# Patient Record
Sex: Male | Born: 1960 | Race: White | Hispanic: No | Marital: Married | Smoking: Former smoker
Health system: Southern US, Community
[De-identification: ages and names within clinical notes are randomized; demographics above are authoritative.]

## PROBLEM LIST (undated history)

## (undated) DIAGNOSIS — G4733 Obstructive sleep apnea (adult) (pediatric): Secondary | ICD-10-CM

## (undated) DIAGNOSIS — R7303 Prediabetes: Secondary | ICD-10-CM

## (undated) DIAGNOSIS — M2550 Pain in unspecified joint: Secondary | ICD-10-CM

## (undated) DIAGNOSIS — R079 Chest pain, unspecified: Secondary | ICD-10-CM

## (undated) DIAGNOSIS — G4730 Sleep apnea, unspecified: Secondary | ICD-10-CM

## (undated) DIAGNOSIS — F1010 Alcohol abuse, uncomplicated: Secondary | ICD-10-CM

## (undated) DIAGNOSIS — Z96642 Presence of left artificial hip joint: Secondary | ICD-10-CM

## (undated) DIAGNOSIS — R0602 Shortness of breath: Secondary | ICD-10-CM

## (undated) DIAGNOSIS — I2510 Atherosclerotic heart disease of native coronary artery without angina pectoris: Secondary | ICD-10-CM

## (undated) DIAGNOSIS — Z9989 Dependence on other enabling machines and devices: Secondary | ICD-10-CM

## (undated) DIAGNOSIS — N209 Urinary calculus, unspecified: Secondary | ICD-10-CM

## (undated) DIAGNOSIS — E785 Hyperlipidemia, unspecified: Secondary | ICD-10-CM

## (undated) DIAGNOSIS — E7800 Pure hypercholesterolemia, unspecified: Secondary | ICD-10-CM

## (undated) DIAGNOSIS — F1990 Other psychoactive substance use, unspecified, uncomplicated: Secondary | ICD-10-CM

## (undated) DIAGNOSIS — I2109 ST elevation (STEMI) myocardial infarction involving other coronary artery of anterior wall: Secondary | ICD-10-CM

## (undated) DIAGNOSIS — Z87442 Personal history of urinary calculi: Secondary | ICD-10-CM

## (undated) HISTORY — DX: Dependence on other enabling machines and devices: Z99.89

## (undated) HISTORY — DX: Obstructive sleep apnea (adult) (pediatric): G47.33

## (undated) HISTORY — DX: Other psychoactive substance use, unspecified, uncomplicated: F19.90

## (undated) HISTORY — DX: Pure hypercholesterolemia, unspecified: E78.00

## (undated) HISTORY — DX: Shortness of breath: R06.02

## (undated) HISTORY — DX: Sleep apnea, unspecified: G47.30

## (undated) HISTORY — DX: Pain in unspecified joint: M25.50

## (undated) HISTORY — DX: Prediabetes: R73.03

## (undated) HISTORY — DX: Chest pain, unspecified: R07.9

## (undated) HISTORY — DX: Atherosclerotic heart disease of native coronary artery without angina pectoris: I25.10

## (undated) HISTORY — DX: Presence of left artificial hip joint: Z96.642

## (undated) HISTORY — DX: Personal history of urinary calculi: Z87.442

## (undated) HISTORY — DX: Hyperlipidemia, unspecified: E78.5

## (undated) HISTORY — DX: Alcohol abuse, uncomplicated: F10.10

---

## 2011-11-03 ENCOUNTER — Other Ambulatory Visit: Payer: Self-pay | Admitting: Family Medicine

## 2011-11-03 ENCOUNTER — Other Ambulatory Visit (HOSPITAL_COMMUNITY): Payer: Self-pay | Admitting: Family Medicine

## 2011-11-03 DIAGNOSIS — R609 Edema, unspecified: Secondary | ICD-10-CM

## 2011-11-03 DIAGNOSIS — N5089 Other specified disorders of the male genital organs: Secondary | ICD-10-CM

## 2011-11-05 ENCOUNTER — Ambulatory Visit (HOSPITAL_COMMUNITY)
Admission: RE | Admit: 2011-11-05 | Discharge: 2011-11-05 | Disposition: A | Discharge status: Home / Self Care | Payer: BC Managed Care – PPO | Source: Ambulatory Visit | Attending: Family Medicine | Admitting: Family Medicine

## 2011-11-05 DIAGNOSIS — N5089 Other specified disorders of the male genital organs: Secondary | ICD-10-CM | POA: Insufficient documentation

## 2011-12-21 ENCOUNTER — Encounter (INDEPENDENT_AMBULATORY_CARE_PROVIDER_SITE_OTHER): Payer: Self-pay | Admitting: General Surgery

## 2011-12-29 ENCOUNTER — Ambulatory Visit (INDEPENDENT_AMBULATORY_CARE_PROVIDER_SITE_OTHER): Payer: Self-pay | Admitting: General Surgery

## 2012-02-01 ENCOUNTER — Ambulatory Visit (INDEPENDENT_AMBULATORY_CARE_PROVIDER_SITE_OTHER): Payer: BC Managed Care – PPO | Admitting: General Surgery

## 2012-02-01 ENCOUNTER — Encounter (INDEPENDENT_AMBULATORY_CARE_PROVIDER_SITE_OTHER): Payer: Self-pay | Admitting: General Surgery

## 2012-02-01 ENCOUNTER — Telehealth (INDEPENDENT_AMBULATORY_CARE_PROVIDER_SITE_OTHER): Payer: Self-pay | Admitting: General Surgery

## 2012-02-01 VITALS — BP 136/84 | HR 84 | Temp 97.7°F | Resp 18 | Ht 72.0 in | Wt 264.0 lb

## 2012-02-01 DIAGNOSIS — K4090 Unilateral inguinal hernia, without obstruction or gangrene, not specified as recurrent: Secondary | ICD-10-CM

## 2012-02-01 NOTE — Progress Notes (Signed)
Patient ID: Wayne Jordan, male   DOB: 07-Feb-1960, 52 y.o.   MRN: [id number]  Chief Complaint  Patient presents with  . New Evaluation    Rt Lower hernia    HPI Wayne Jordan is a 52 y.o. male.  The patient is a 52 year old male was referred by Dr. Mena Goes office for a right inguinal hernia. Patient states his hernia and scrotal mass in his right scrotal area for approximately 6 years.  The patient states he has issues with urination with bladder full he feels a right induration or scrotum. Patient has had no problems with incarceration or obstruction. HPI  Past Medical History  Diagnosis Date  . Personal history of urinary calculi     Past Surgical History  Procedure Date  . Shoulder surgery     right    Family History  Problem Relation Age of Onset  . Heart disease Father     Social History History  Substance Use Topics  . Smoking status: Former Games developer  . Smokeless tobacco: Not on file  . Alcohol Use: Yes    Allergies  Allergen Reactions  . Codeine     Derivalives    No current outpatient prescriptions on file.    Review of Systems Review of Systems  Blood pressure 136/84, pulse 84, temperature 97.7 F (36.5 C), temperature source Temporal, resp. rate 18, height 6' (1.829 m), weight 264 lb (119.75 kg).  Physical Exam Physical Exam  Data Reviewed none  Assessment    52 year old male with a large right inguinal hernia and likely involving the bladder.    Plan    1. We'll obtain a abdominal CT scan to evaluate his bladder and hernia the right side.  2. After this pain and reviewed we'll schedule patient for an open versus laparoscopic hernia repair with Mesh.  At this point leading towards an open approach. I discussed the risks and benefits with the patient. The patient is scheduled after the CT has been reviewed  3.All risks and benefits were discussed with the patient, to generally include infection, bleeding, damage to surrounding structures, and  recurrence. Alternatives were offered and described.  All questions were answered and the patient voiced understanding of the procedure and wishes to proceed at this point.          Wayne Jordan., Wayne Jordan 02/01/2012, 4:15 PM

## 2012-02-01 NOTE — Telephone Encounter (Signed)
LMOM to see if patient could come in sooner for today's appt..1:45 for a 2:00 appt...ask the patient to return my call

## 2012-02-02 ENCOUNTER — Telehealth (INDEPENDENT_AMBULATORY_CARE_PROVIDER_SITE_OTHER): Payer: Self-pay | Admitting: General Surgery

## 2012-02-02 ENCOUNTER — Other Ambulatory Visit (INDEPENDENT_AMBULATORY_CARE_PROVIDER_SITE_OTHER): Payer: Self-pay | Admitting: General Surgery

## 2012-02-02 DIAGNOSIS — K469 Unspecified abdominal hernia without obstruction or gangrene: Secondary | ICD-10-CM

## 2012-02-02 NOTE — Telephone Encounter (Signed)
Called to give patient his appt date and time for his CT of abd and pelvis with contrast 02/08/12 at 3:45 [address]..patient was ok with all instructions given

## 2012-02-08 ENCOUNTER — Ambulatory Visit
Admission: RE | Admit: 2012-02-08 | Discharge: 2012-02-08 | Disposition: A | Discharge status: Home / Self Care | Payer: BC Managed Care – PPO | Source: Ambulatory Visit | Attending: General Surgery | Admitting: General Surgery

## 2012-02-08 DIAGNOSIS — K469 Unspecified abdominal hernia without obstruction or gangrene: Secondary | ICD-10-CM

## 2012-02-08 MED ORDER — IOHEXOL 300 MG/ML  SOLN
125.0000 mL | Freq: Once | INTRAMUSCULAR | Status: AC | PRN
  Administered 2012-02-08: 125 mL via INTRAVENOUS

## 2012-02-11 ENCOUNTER — Encounter (INDEPENDENT_AMBULATORY_CARE_PROVIDER_SITE_OTHER): Payer: Self-pay | Admitting: General Surgery

## 2012-02-11 ENCOUNTER — Ambulatory Visit (INDEPENDENT_AMBULATORY_CARE_PROVIDER_SITE_OTHER): Payer: BC Managed Care – PPO | Admitting: General Surgery

## 2012-02-11 VITALS — BP 150/82 | HR 107 | Temp 97.0°F | Resp 20 | Ht 72.0 in | Wt 265.0 lb

## 2012-02-11 DIAGNOSIS — K4090 Unilateral inguinal hernia, without obstruction or gangrene, not specified as recurrent: Secondary | ICD-10-CM

## 2012-02-11 NOTE — Progress Notes (Signed)
Subjective:     Patient ID: Wayne Jordan, male   DOB: 1960-11-17, 52 y.o.   MRN: [id number]  HPI Patient's 52 year old male who I recently saw for a right inguinal hernia. Patient on the CT scan and it contents of the hernia secondary to symptomatology which seemed liked involvement of the bladder. CT scan reveals a sliding hernia with a ladder in the hernia sac extending down towards the scrotum. This has been changed since his initial visit.  Review of Systems  Constitutional: Negative.   HENT: Negative.   Respiratory: Negative.   Cardiovascular: Negative.   Gastrointestinal: Negative.   Musculoskeletal: Negative.   Neurological: Negative.        Objective:   Physical Exam  Constitutional: He is oriented to person, place, and time. He appears well-developed and well-nourished.  HENT:  Head: Normocephalic and atraumatic.  Eyes: Conjunctivae normal and EOM are normal.  Neck: Normal range of motion. Neck supple.  Cardiovascular: Normal rate and normal heart sounds.   Pulmonary/Chest: Effort normal and breath sounds normal.  Abdominal: Soft. Bowel sounds are normal. He exhibits no mass. There is tenderness. There is no rebound and no guarding. A hernia is present. Hernia confirmed positive in the right inguinal area.  Genitourinary: Penis normal.  Musculoskeletal: Normal range of motion.  Neurological: He is alert and oriented to person, place, and time.       Assessment:     52 year old male with a sliding right inguinal hernia involving the bladder.    Plan:     1. We'll proceed to the operating room for an open right inguinal hernia repair with mesh.  2.  All risks and benefits were discussed with the patient, to generally include infection, bleeding, damage to surrounding structures, and recurrence. Alternatives were offered and described.  All questions were answered and the patient voiced understanding of the procedure and wishes to proceed at this point.

## 2012-02-24 ENCOUNTER — Encounter (HOSPITAL_COMMUNITY): Payer: Self-pay | Admitting: Pharmacy Technician

## 2012-03-03 ENCOUNTER — Encounter (HOSPITAL_COMMUNITY): Payer: Self-pay

## 2012-03-03 ENCOUNTER — Encounter (HOSPITAL_COMMUNITY)
Admission: RE | Admit: 2012-03-03 | Discharge: 2012-03-03 | Disposition: A | Discharge status: Home / Self Care | Payer: BC Managed Care – PPO | Source: Ambulatory Visit | Attending: General Surgery | Admitting: General Surgery

## 2012-03-03 HISTORY — DX: Urinary calculus, unspecified: N20.9

## 2012-03-03 LAB — CBC
HCT: 46.6 % (ref 39.0–52.0)
MCHC: 34.8 g/dL (ref 30.0–36.0)
MCV: 90 fL (ref 78.0–100.0)
RDW: 12.9 % (ref 11.5–15.5)

## 2012-03-03 NOTE — Pre-Procedure Instructions (Addendum)
Wayne Jordan  03/03/2012   Your procedure is scheduled on: 03/08/12  Report to [facility] at 1100 AM.  Call this number if you have problems the morning of surgery: [phone]   Remember:   Do not eat food or drink liquids after midnight.   Take these medicines the morning of surgery with A SIP OF WATER: none   Do not wear jewelry, make-up or nail polish.  Do not wear lotions, powders, or perfumes. You may wear deodorant.  Do not shave 48 hours prior to surgery. Men may shave face and neck.  Do not bring valuables to the hospital.  Contacts, dentures or bridgework may not be worn into surgery.  Leave suitcase in the car. After surgery it may be brought to your room.  For patients admitted to the hospital, checkout time is 11:00 AM the day of  discharge.   Patients discharged the day of surgery will not be allowed to drive  home.  Name and phone number of your driver:   Special Instructions: Shower using CHG 2 nights before surgery and the night before surgery.  If you shower the day of surgery use CHG.  Use special wash - you have one bottle of CHG for all showers.  You should use approximately 1/3 of the bottle for each shower.   Please read over the following fact sheets that you were given: Pain Booklet, Coughing and Deep Breathing, MRSA Information and Surgical Site Infection Prevention

## 2012-03-07 MED ORDER — VANCOMYCIN HCL 10 G IV SOLR
1500.0000 mg | Freq: Once | INTRAVENOUS | Status: AC
  Administered 2012-03-08: 1500 mg via INTRAVENOUS
  Filled 2012-03-07: qty 1500

## 2012-03-07 MED ORDER — CEFAZOLIN SODIUM-DEXTROSE 2-3 GM-% IV SOLR: 2.0000 g | INTRAVENOUS | Status: DC

## 2012-03-07 NOTE — Progress Notes (Signed)
LEFT MESSAGE FOR PATIENT TO ARRIVE AT 1030 AM 03/08/12.  REQUESTED PATIENT RETURN CALL .

## 2012-03-08 ENCOUNTER — Encounter (HOSPITAL_COMMUNITY)
Admission: RE | Disposition: A | Discharge status: Home / Self Care | Payer: Self-pay | Source: Ambulatory Visit | Attending: General Surgery

## 2012-03-08 ENCOUNTER — Ambulatory Visit (HOSPITAL_COMMUNITY)
Admission: RE | Admit: 2012-03-08 | Discharge: 2012-03-08 | Disposition: A | Discharge status: Home / Self Care | Payer: BC Managed Care – PPO | Source: Ambulatory Visit | Attending: General Surgery | Admitting: General Surgery

## 2012-03-08 ENCOUNTER — Encounter (HOSPITAL_COMMUNITY): Payer: Self-pay | Admitting: Anesthesiology

## 2012-03-08 ENCOUNTER — Encounter (HOSPITAL_COMMUNITY): Payer: Self-pay | Admitting: *Deleted

## 2012-03-08 ENCOUNTER — Ambulatory Visit (HOSPITAL_COMMUNITY): Payer: BC Managed Care – PPO | Admitting: Anesthesiology

## 2012-03-08 DIAGNOSIS — K4090 Unilateral inguinal hernia, without obstruction or gangrene, not specified as recurrent: Secondary | ICD-10-CM

## 2012-03-08 DIAGNOSIS — Z01812 Encounter for preprocedural laboratory examination: Secondary | ICD-10-CM | POA: Insufficient documentation

## 2012-03-08 HISTORY — PX: INSERTION OF MESH: SHX5868

## 2012-03-08 HISTORY — PX: INGUINAL HERNIA REPAIR: SHX194

## 2012-03-08 SURGERY — REPAIR, HERNIA, INGUINAL, ADULT
Anesthesia: General | Site: Groin | Laterality: Right | Wound class: Clean Contaminated

## 2012-03-08 MED ORDER — BUPIVACAINE-EPINEPHRINE 0.25% -1:200000 IJ SOLN
INTRAMUSCULAR | Status: AC
  Filled 2012-03-08: qty 1

## 2012-03-08 MED ORDER — PROPOFOL 10 MG/ML IV BOLUS
INTRAVENOUS | Status: DC | PRN
  Administered 2012-03-08: 200 mg via INTRAVENOUS

## 2012-03-08 MED ORDER — NEOSTIGMINE METHYLSULFATE 1 MG/ML IJ SOLN
INTRAMUSCULAR | Status: DC | PRN
  Administered 2012-03-08: 3 mg via INTRAVENOUS

## 2012-03-08 MED ORDER — LIDOCAINE HCL (CARDIAC) 20 MG/ML IV SOLN
INTRAVENOUS | Status: DC | PRN
  Administered 2012-03-08: 100 mg via INTRAVENOUS

## 2012-03-08 MED ORDER — LACTATED RINGERS IV SOLN
INTRAVENOUS | Status: DC | PRN
  Administered 2012-03-08 (×2): via INTRAVENOUS

## 2012-03-08 MED ORDER — HYDROMORPHONE HCL PF 1 MG/ML IJ SOLN: 0.2500 mg | INTRAMUSCULAR | Status: DC | PRN

## 2012-03-08 MED ORDER — BUPIVACAINE-EPINEPHRINE 0.25% -1:200000 IJ SOLN
INTRAMUSCULAR | Status: DC | PRN
  Administered 2012-03-08: 8 mL

## 2012-03-08 MED ORDER — OXYCODONE-ACETAMINOPHEN 10-325 MG PO TABS: 1.0000 | ORAL_TABLET | ORAL | Status: DC | PRN

## 2012-03-08 MED ORDER — GLYCOPYRROLATE 0.2 MG/ML IJ SOLN
INTRAMUSCULAR | Status: DC | PRN
  Administered 2012-03-08: 0.4 mg via INTRAVENOUS

## 2012-03-08 MED ORDER — ONDANSETRON HCL 4 MG/2ML IJ SOLN
INTRAMUSCULAR | Status: DC | PRN
  Administered 2012-03-08: 4 mg via INTRAVENOUS

## 2012-03-08 MED ORDER — ROCURONIUM BROMIDE 100 MG/10ML IV SOLN
INTRAVENOUS | Status: DC | PRN
  Administered 2012-03-08: 10 mg via INTRAVENOUS
  Administered 2012-03-08: 50 mg via INTRAVENOUS
  Administered 2012-03-08: 10 mg via INTRAVENOUS

## 2012-03-08 MED ORDER — DEXAMETHASONE SODIUM PHOSPHATE 10 MG/ML IJ SOLN
INTRAMUSCULAR | Status: DC | PRN
  Administered 2012-03-08: 10 mg via INTRAVENOUS

## 2012-03-08 MED ORDER — PHENYLEPHRINE HCL 10 MG/ML IJ SOLN: INTRAMUSCULAR | Status: DC | PRN

## 2012-03-08 MED ORDER — OXYCODONE HCL 5 MG/5ML PO SOLN: 5.0000 mg | Freq: Once | ORAL | Status: DC | PRN

## 2012-03-08 MED ORDER — CHLORHEXIDINE GLUCONATE 4 % EX LIQD: 1.0000 "application " | Freq: Once | CUTANEOUS | Status: DC

## 2012-03-08 MED ORDER — OXYCODONE HCL 5 MG PO TABS
5.0000 mg | ORAL_TABLET | Freq: Once | ORAL | Status: DC | PRN
Start: 2012-03-08 — End: 2012-03-08

## 2012-03-08 MED ORDER — FENTANYL CITRATE 0.05 MG/ML IJ SOLN
INTRAMUSCULAR | Status: DC | PRN
  Administered 2012-03-08: 50 ug via INTRAVENOUS
  Administered 2012-03-08: 150 ug via INTRAVENOUS
  Administered 2012-03-08: 50 ug via INTRAVENOUS
  Administered 2012-03-08: 100 ug via INTRAVENOUS
  Administered 2012-03-08 (×2): 50 ug via INTRAVENOUS

## 2012-03-08 MED ORDER — METOCLOPRAMIDE HCL 5 MG/ML IJ SOLN
INTRAMUSCULAR | Status: DC | PRN
  Administered 2012-03-08: 10 mg via INTRAVENOUS

## 2012-03-08 MED ORDER — MIDAZOLAM HCL 5 MG/5ML IJ SOLN
INTRAMUSCULAR | Status: DC | PRN
  Administered 2012-03-08: 2 mg via INTRAVENOUS

## 2012-03-08 MED ORDER — METOCLOPRAMIDE HCL 5 MG/ML IJ SOLN: 10.0000 mg | Freq: Once | INTRAMUSCULAR | Status: DC | PRN

## 2012-03-08 SURGICAL SUPPLY — 55 items
ADH SKN CLS APL DERMABOND .7 (GAUZE/BANDAGES/DRESSINGS) ×2
APL SKNCLS STERI-STRIP NONHPOA (GAUZE/BANDAGES/DRESSINGS) ×2
BENZOIN TINCTURE PRP APPL 2/3 (GAUZE/BANDAGES/DRESSINGS) ×3 IMPLANT
BLADE SURG 15 STRL LF DISP TIS (BLADE) ×2 IMPLANT
BLADE SURG 15 STRL SS (BLADE) ×3
BLADE SURG ROTATE 9660 (MISCELLANEOUS) ×1 IMPLANT
CHLORAPREP W/TINT 26ML (MISCELLANEOUS) ×3 IMPLANT
CLOTH BEACON ORANGE TIMEOUT ST (SAFETY) ×3 IMPLANT
COVER SURGICAL LIGHT HANDLE (MISCELLANEOUS) ×3 IMPLANT
DECANTER SPIKE VIAL GLASS SM (MISCELLANEOUS) ×3 IMPLANT
DERMABOND ADVANCED (GAUZE/BANDAGES/DRESSINGS) ×1
DERMABOND ADVANCED .7 DNX12 (GAUZE/BANDAGES/DRESSINGS) IMPLANT
DRAIN PENROSE 1/2X12 LTX STRL (WOUND CARE) ×1 IMPLANT
DRAPE LAPAROSCOPIC ABDOMINAL (DRAPES) IMPLANT
DRAPE LAPAROTOMY TRNSV 102X78 (DRAPE) ×1 IMPLANT
DRAPE UTILITY 15X26 W/TAPE STR (DRAPE) ×12 IMPLANT
DRSG TEGADERM 4X4.75 (GAUZE/BANDAGES/DRESSINGS) IMPLANT
ELECT CAUTERY BLADE 6.4 (BLADE) ×3 IMPLANT
ELECT REM PT RETURN 9FT ADLT (ELECTROSURGICAL) ×3
ELECTRODE REM PT RTRN 9FT ADLT (ELECTROSURGICAL) ×2 IMPLANT
GAUZE SPONGE 4X4 16PLY XRAY LF (GAUZE/BANDAGES/DRESSINGS) ×3 IMPLANT
GLOVE BIO SURGEON STRL SZ7.5 (GLOVE) ×3 IMPLANT
GLOVE BIOGEL PI IND STRL 8 (GLOVE) ×2 IMPLANT
GLOVE BIOGEL PI INDICATOR 8 (GLOVE) ×1
GOWN STRL NON-REIN LRG LVL3 (GOWN DISPOSABLE) ×6 IMPLANT
GOWN STRL REIN XL XLG (GOWN DISPOSABLE) ×3 IMPLANT
KIT BASIN OR (CUSTOM PROCEDURE TRAY) ×3 IMPLANT
KIT ROOM TURNOVER OR (KITS) ×3 IMPLANT
MESH ULTRAPRO 3X6 7.6X15CM (Mesh General) ×1 IMPLANT
NDL HYPO 25GX1X1/2 BEV (NEEDLE) ×2 IMPLANT
NEEDLE HYPO 25GX1X1/2 BEV (NEEDLE) ×3 IMPLANT
NS IRRIG 1000ML POUR BTL (IV SOLUTION) ×3 IMPLANT
PACK SURGICAL SETUP 50X90 (CUSTOM PROCEDURE TRAY) ×3 IMPLANT
PAD ARMBOARD 7.5X6 YLW CONV (MISCELLANEOUS) ×3 IMPLANT
PENCIL BUTTON HOLSTER BLD 10FT (ELECTRODE) ×3 IMPLANT
SPECIMEN JAR SMALL (MISCELLANEOUS) IMPLANT
SPONGE GAUZE 4X4 12PLY (GAUZE/BANDAGES/DRESSINGS) IMPLANT
SPONGE INTESTINAL PEANUT (DISPOSABLE) ×1 IMPLANT
STRIP CLOSURE SKIN 1/2X4 (GAUZE/BANDAGES/DRESSINGS) IMPLANT
SUT MNCRL AB 4-0 PS2 18 (SUTURE) ×3 IMPLANT
SUT PDS AB 0 CT 36 (SUTURE) IMPLANT
SUT PROLENE 2 0 SH DA (SUTURE) ×3 IMPLANT
SUT SILK 2 0 SH (SUTURE) IMPLANT
SUT SILK 3 0 (SUTURE) ×3
SUT SILK 3-0 18XBRD TIE 12 (SUTURE) ×2 IMPLANT
SUT VIC AB 0 CT2 27 (SUTURE) IMPLANT
SUT VIC AB 2-0 SH 27 (SUTURE) ×6
SUT VIC AB 2-0 SH 27X BRD (SUTURE) ×2 IMPLANT
SUT VIC AB 3-0 SH 27 (SUTURE) ×3
SUT VIC AB 3-0 SH 27XBRD (SUTURE) ×2 IMPLANT
SYR CONTROL 10ML LL (SYRINGE) ×3 IMPLANT
TOWEL OR 17X24 6PK STRL BLUE (TOWEL DISPOSABLE) ×3 IMPLANT
TOWEL OR 17X26 10 PK STRL BLUE (TOWEL DISPOSABLE) ×3 IMPLANT
TUBE CONNECTING 12X1/4 (SUCTIONS) ×1 IMPLANT
YANKAUER SUCT BULB TIP NO VENT (SUCTIONS) ×2 IMPLANT

## 2012-03-08 NOTE — H&P (View-Only) (Signed)
Subjective:     Patient ID: Wayne Jordan, male   DOB: 08/26/1960, 51 y.o.   MRN: [id number]  HPI Patient's 51-year-old male who I recently saw for a right inguinal hernia. Patient on the CT scan and it contents of the hernia secondary to symptomatology which seemed liked involvement of the bladder. CT scan reveals a sliding hernia with a ladder in the hernia sac extending down towards the scrotum. This has been changed since his initial visit.  Review of Systems  Constitutional: Negative.   HENT: Negative.   Respiratory: Negative.   Cardiovascular: Negative.   Gastrointestinal: Negative.   Musculoskeletal: Negative.   Neurological: Negative.        Objective:   Physical Exam  Constitutional: He is oriented to person, place, and time. He appears well-developed and well-nourished.  HENT:  Head: Normocephalic and atraumatic.  Eyes: Conjunctivae normal and EOM are normal.  Neck: Normal range of motion. Neck supple.  Cardiovascular: Normal rate and normal heart sounds.   Pulmonary/Chest: Effort normal and breath sounds normal.  Abdominal: Soft. Bowel sounds are normal. He exhibits no mass. There is tenderness. There is no rebound and no guarding. A hernia is present. Hernia confirmed positive in the right inguinal area.  Genitourinary: Penis normal.  Musculoskeletal: Normal range of motion.  Neurological: He is alert and oriented to person, place, and time.       Assessment:     51-year-old male with a sliding right inguinal hernia involving the bladder.    Plan:     1. We'll proceed to the operating room for an open right inguinal hernia repair with mesh.  2.  All risks and benefits were discussed with the patient, to generally include infection, bleeding, damage to surrounding structures, and recurrence. Alternatives were offered and described.  All questions were answered and the patient voiced understanding of the procedure and wishes to proceed at this point.       

## 2012-03-08 NOTE — Op Note (Addendum)
Pre Operative Diagnosis:  RIH  Post Operative Diagnosis: R pantaloon hernia  Procedure: R IHR with mesh  Surgeon: Dr. Axel Filler  Assistant: none  Anesthesia: GETA  EBL: 10cc  Complications: none  Counts: reported as correct x 2  Findings:  The patient had a direct and indirect hernia. The indirect hernia sac was opened and seen to be containing part of the bladder. High ligation of the indirect hernia sac was undertaken in the hernia sac retracted into the abdomen.  Indications for procedure:  The patient is a 52 year old male with a multiple week history of right bulging and pain. Patient also had some issues with  empting of the bladder.  The patient underwent CT of abdomen and pelvis which revealed a sliding anal hernia which contained a portion of the bladder.  Details of the procedure:The patient was taken back to the operating room. The patient was placed in supine position with bilateral SCDs in place. After appropriate anitbiotics were confirmed, a time-out was confirmed and all facts were verified.  A 4 cm incision was made approximately 1 cm above the ilioinguinal ligament. Bovie cautery was used maintain hemostasis an dissection was carried down through Scarpa's fascia. Dissection was carried down to the external oblique muscle. A stab incision was made laterally and blunt dissection using Metzenbaum scissors was used to free up any underlying tissue. The incision in the external oblique was then carried through the external ring. This freed up the spermatic cord. The ilioinguinal nerve was identified sacrificed and suture ligated with a 3-0 Vicryl.The external oblique was then cleaned off and inferiorly as well as superiorly. The spermatic cord was encircled at the pubic tubercle which was extremely difficult secondary to fat and hernia content. A Penrose drain was placed around the cord. The spermatic vessels and vas deferens  Were identified early and protected all portions  of the case.  a large direct hernia was identified and this was reduced back into the abdomen is easily as possible. At this time the cremasteric muscles were dissected away from the hernia sac the hernia sac was dissected back towards the epigastric vessels. The indirect hernia sac was then opened a part of the wall was seen to be in the bladder. The dissected ligated as high as possible without interfering with the bladder using a 2-0 Vicryl stitch. This spontaneously reduced into the abdomen. At this point the rectum was cleared off and a piece of 3 x 6 cm Prolene mesh was cut to shape and anchored to the pubic to home using 2-0 Prolene. The inferior edge of the mesh was then secured in a running fashion to the shelving edge of the external oblique. The superior edge was anchored to the conjoint tendon in a running fashion. The tails were brought together and a slit was made in the tail of the mesh to recreate the internal inguinal canal. The tail of the duct under the external oblique. The external oblique was reapproximated using 2-0 Vicryl in a running fashion. Scarpa's fascia was reapproximated using 3-0 Vicryl in a running fashion. The skin was reapproximated using 4 Monocryl subcuticular fashion. The skin was addressed with Dermabond. The patient's right testicle was confirmed to be within the scrotum at the end of the case.  The patient was taken to the recovery room in stable condition.

## 2012-03-08 NOTE — OR Nursing (Signed)
I was given an order by Dr. Derrell Lolling to irrigate the bladder with of normal saline before removing the catheter. When I irrigated I was only able to insert  before I met too much resistance to move forward. I alerted the Doctor and he said ok pull the foley, so I removed the foley as directed.

## 2012-03-08 NOTE — Transfer of Care (Signed)
Immediate Anesthesia Transfer of Care Note  Patient: Wayne Jordan  Procedure(s) Performed: Procedure(s): HERNIA REPAIR INGUINAL ADULT (Right) INSERTION OF MESH (N/A)  Patient Location: PACU  Anesthesia Type:General  Level of Consciousness: awake, alert , oriented and patient cooperative  Airway & Oxygen Therapy: Patient Spontanous Breathing and Patient connected to nasal cannula oxygen  Post-op Assessment: Report given to PACU RN, Post -op Vital signs reviewed and stable and Patient moving all extremities  Post vital signs: Reviewed and stable  Complications: No apparent anesthesia complications

## 2012-03-08 NOTE — Progress Notes (Signed)
Report given to sharon rn as caregiver 

## 2012-03-08 NOTE — Anesthesia Preprocedure Evaluation (Addendum)
Anesthesia Evaluation  Patient identified by MRN, date of birth, ID band Patient awake    Reviewed: Allergy & Precautions, H&P , NPO status , Patient's Chart, lab work & pertinent test results, reviewed documented beta blocker date and time   Airway Mallampati: III TM Distance: >3 FB Neck ROM: full    Dental  (+) Dental Advisory Given   Pulmonary neg pulmonary ROS,  breath sounds clear to auscultation        Cardiovascular negative cardio ROS  Rhythm:regular     Neuro/Psych negative neurological ROS  negative psych ROS   GI/Hepatic negative GI ROS, Neg liver ROS,   Endo/Other  negative endocrine ROSMorbid obesity  Renal/GU negative Renal ROS  negative genitourinary   Musculoskeletal   Abdominal   Peds  Hematology negative hematology ROS (+)   Anesthesia Other Findings See surgeon's H&P   Reproductive/Obstetrics negative OB ROS                          Anesthesia Physical Anesthesia Plan  ASA: II  Anesthesia Plan: General   Post-op Pain Management:    Induction: Intravenous  Airway Management Planned: LMA  Additional Equipment:   Intra-op Plan:   Post-operative Plan: Extubation in OR  Informed Consent: I have reviewed the patients History and Physical, chart, labs and discussed the procedure including the risks, benefits and alternatives for the proposed anesthesia with the patient or authorized representative who has indicated his/her understanding and acceptance.   Dental Advisory Given  Plan Discussed with: CRNA, Surgeon and Anesthesiologist  Anesthesia Plan Comments:        Anesthesia Quick Evaluation

## 2012-03-08 NOTE — Anesthesia Procedure Notes (Addendum)
Procedure Name: Intubation Date/Time: 03/08/2012 1:04 PM Performed by: Rogelia Boga Pre-anesthesia Checklist: Patient identified, Emergency Drugs available, Suction available, Patient being monitored and Timeout performed Patient Re-evaluated:Patient Re-evaluated prior to inductionOxygen Delivery Method: Circle system utilized Laryngoscope Size: Mac and 4 Grade View: Grade II Tube type: Oral Tube size: 7.5 mm Number of attempts: 1 Airway Equipment and Method: Stylet Secured at: 21 cm Tube secured with: Tape Dental Injury: Teeth and Oropharynx as per pre-operative assessment  Comments: Surgeon Needed Muscle Relaxation and Proseal LMA was removed and Pt Intubated as above.    Procedure Name: LMA Insertion Date/Time: 03/08/2012 12:45 PM Performed by: Rogelia Boga Pre-anesthesia Checklist: Patient identified, Emergency Drugs available, Suction available, Patient being monitored and Timeout performed Patient Re-evaluated:Patient Re-evaluated prior to inductionPreoxygenation: Pre-oxygenation with 100% oxygen Intubation Type: IV induction Ventilation: Mask ventilation without difficulty LMA: LMA with gastric port inserted LMA Size: 5.0 Number of attempts: 1 Placement Confirmation: positive ETCO2 and breath sounds checked- equal and bilateral Tube secured with: Tape Dental Injury: Teeth and Oropharynx as per pre-operative assessment

## 2012-03-08 NOTE — Interval H&P Note (Signed)
History and Physical Interval Note:  03/08/2012 10:10 AM  Wayne Jordan  has presented today for surgery, with the diagnosis of right sliding inguinal hernia  The various methods of treatment have been discussed with the patient and family. After consideration of risks, benefits and other options for treatment, the patient has consented to  Procedure(s): HERNIA REPAIR INGUINAL ADULT (Right) INSERTION OF MESH (N/A) as a surgical intervention .  The patient's history has been reviewed, patient examined, no change in status, stable for surgery.  I have reviewed the patient's chart and labs.  Questions were answered to the patient's satisfaction.     Marigene Ehlers., Jed Limerick

## 2012-03-08 NOTE — Anesthesia Postprocedure Evaluation (Signed)
Anesthesia Post Note  Patient: Wayne Jordan  Procedure(s) Performed: Procedure(s) (LRB): HERNIA REPAIR INGUINAL ADULT (Right) INSERTION OF MESH (N/A)  Anesthesia type: General  Patient location: PACU  Post pain: Pain level controlled and Adequate analgesia  Post assessment: Post-op Vital signs reviewed, Patient's Cardiovascular Status Stable, Respiratory Function Stable, Patent Airway and Pain level controlled  Last Vitals:  Filed Vitals:   03/08/12 1633  BP: 137/84  Pulse: 66  Temp: 36.1 C  Resp: 16    Post vital signs: Reviewed and stable  Level of consciousness: awake, alert  and oriented  Complications: No apparent anesthesia complications

## 2012-03-08 NOTE — Preoperative (Signed)
Beta Blockers   Reason not to administer Beta Blockers:Not Applicable 

## 2012-03-09 ENCOUNTER — Telehealth (INDEPENDENT_AMBULATORY_CARE_PROVIDER_SITE_OTHER): Payer: Self-pay | Admitting: General Surgery

## 2012-03-09 NOTE — Telephone Encounter (Signed)
Spoke with patient  F/u OV   appt  03/24/12 @ 420PM

## 2012-03-10 ENCOUNTER — Encounter (HOSPITAL_COMMUNITY): Payer: Self-pay | Admitting: General Surgery

## 2012-03-24 ENCOUNTER — Ambulatory Visit (INDEPENDENT_AMBULATORY_CARE_PROVIDER_SITE_OTHER): Payer: BC Managed Care – PPO | Admitting: General Surgery

## 2012-03-24 DIAGNOSIS — Z9889 Other specified postprocedural states: Secondary | ICD-10-CM

## 2012-03-27 NOTE — Progress Notes (Signed)
Patient ID: Wayne Jordan, male   DOB: 02/28/1960, 52 y.o.   MRN: [id number] Pt is s/p [facility] repair.  HE has been doing well no complaints. BAck at work  On exam Wnd c/d/i No hernia on palp  Another 4 weeks wt restricted lifting F/u PRN

## 2012-06-13 ENCOUNTER — Encounter (HOSPITAL_COMMUNITY): Payer: Self-pay | Admitting: Emergency Medicine

## 2012-06-13 ENCOUNTER — Emergency Department (HOSPITAL_COMMUNITY)
Admission: EM | Admit: 2012-06-13 | Discharge: 2012-06-13 | Disposition: A | Discharge status: Home / Self Care | Payer: BC Managed Care – PPO | Attending: Emergency Medicine | Admitting: Emergency Medicine

## 2012-06-13 ENCOUNTER — Emergency Department (HOSPITAL_COMMUNITY): Payer: BC Managed Care – PPO

## 2012-06-13 DIAGNOSIS — Z87891 Personal history of nicotine dependence: Secondary | ICD-10-CM | POA: Insufficient documentation

## 2012-06-13 DIAGNOSIS — Z880 Allergy status to penicillin: Secondary | ICD-10-CM | POA: Insufficient documentation

## 2012-06-13 DIAGNOSIS — R0789 Other chest pain: Secondary | ICD-10-CM | POA: Insufficient documentation

## 2012-06-13 DIAGNOSIS — R079 Chest pain, unspecified: Secondary | ICD-10-CM

## 2012-06-13 DIAGNOSIS — R61 Generalized hyperhidrosis: Secondary | ICD-10-CM | POA: Insufficient documentation

## 2012-06-13 DIAGNOSIS — Z87442 Personal history of urinary calculi: Secondary | ICD-10-CM | POA: Insufficient documentation

## 2012-06-13 LAB — COMPREHENSIVE METABOLIC PANEL
ALT: 29 U/L (ref 0–53)
AST: 24 U/L (ref 0–37)
CO2: 26 mEq/L (ref 19–32)
Calcium: 9.7 mg/dL (ref 8.4–10.5)
Chloride: 107 mEq/L (ref 96–112)
GFR calc non Af Amer: >90 mL/min (ref >90)
Potassium: 4.2 mEq/L (ref 3.5–5.1)
Sodium: 142 mEq/L (ref 135–145)

## 2012-06-13 LAB — CBC WITH DIFFERENTIAL/PLATELET
Basophils Absolute: 0 10*3/uL (ref 0.0–0.1)
Eosinophils Relative: 2 % (ref 0–5)
Lymphocytes Relative: 27 % (ref 12–46)
Neutro Abs: 4.7 10*3/uL (ref 1.7–7.7)
Neutrophils Relative %: 63 % (ref 43–77)
Platelets: 201 10*3/uL (ref 150–400)
RDW: 13.3 % (ref 11.5–15.5)
WBC: 7.5 10*3/uL (ref 4.0–10.5)

## 2012-06-13 LAB — POCT I-STAT TROPONIN I

## 2012-06-13 MED ORDER — ASPIRIN 81 MG PO CHEW
324.0000 mg | CHEWABLE_TABLET | Freq: Once | ORAL | Status: AC
  Administered 2012-06-13: 324 mg via ORAL
  Filled 2012-06-13: qty 4

## 2012-06-13 NOTE — ED Provider Notes (Signed)
Medical screening examination/treatment/procedure(s) were performed by non-physician practitioner and as supervising physician I was immediately available for consultation/collaboration.   Ladaisha Portillo, MD 06/13/12 2220 

## 2012-06-13 NOTE — ED Provider Notes (Signed)
History     CSN: [id number]  Arrival date & time 06/13/12  1826   First MD Initiated Contact with Patient 06/13/12 1858      Chief Complaint  Patient presents with  . Chest Pain    (Consider location/radiation/quality/duration/timing/severity/associated sxs/prior treatment) HPI  ISAAK DELMUNDO is a 52 y.o. male c/o substernalchest pain, radiating to jaw, described as sharp, rated at 8/10 at worst, 0/10 now. Relieved by nothing. Pain started x7hours ago while pt was working. Pain has been intermittent lasting for seconds at a time, non-exertional, non-pleuritic or positional. Pain is associated with sweating. Denies SOB, N/V,  cough, fever, back pain, syncope, prior episodes, recent cocaine/methamphetimine use. Denies h/o DVT, PE,  recent travel, leg swelling, hemoptysis.  Pt has not received any ASA or NTG in the last 24 hours.  RF:  Former smoker quit in 97 (40 pack year history), family history (father and uncle had MI in their 33s) Cath:?  Last Stress test: ? Cardiologost: None PCP: [facility]   Past Medical History  Diagnosis Date  . Personal history of urinary calculi   . 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     Past Surgical History  Procedure Laterality Date  . Inguinal hernia repair Right 03/08/2012    Procedure: HERNIA REPAIR INGUINAL ADULT;  Surgeon: Axel Filler, MD;  Location: [facility] OR;  Service: General;  Laterality: Right;  . Insertion of mesh N/A 03/08/2012    Procedure: INSERTION OF MESH;  Surgeon: Axel Filler, MD;  Location: [facility] OR;  Service: General;  Laterality: N/A;    Family History  Problem Relation Age of Onset  . Heart disease Father     History  Substance Use Topics  . Smoking status: Former Smoker -- 1.50 packs/day for 7 years    Types: Cigarettes    Quit date: 03/04/1995  . Smokeless tobacco: Not on file  . Alcohol Use: Yes      Review of Systems  Constitutional: Positive for diaphoresis. Negative for fever.  Respiratory: Negative for shortness  of breath.   Cardiovascular: Positive for chest pain.  Gastrointestinal: Negative for nausea, vomiting, abdominal pain and diarrhea.  All other systems reviewed and are negative.    Allergies  Penicillins  Home Medications   Current Outpatient Rx  Name  Route  Sig  Dispense  Refill  . oxyCODONE-acetaminophen (PERCOCET) 10-325 MG per tablet   Oral   Take 1 tablet by mouth every 4 (four) hours as needed for pain.   30 tablet   0     BP 138/106  Pulse 105  Temp(Src) 98.3 F (36.8 C) (Oral)  Resp 16  SpO2 94%  Physical Exam  Nursing note and vitals reviewed. Constitutional: He is oriented to person, place, and time. He appears well-developed and well-nourished. No distress.  HENT:  Head: Normocephalic.  Mouth/Throat: Oropharynx is clear and moist.  Eyes: Conjunctivae and EOM are normal. Pupils are equal, round, and reactive to light.  Neck: Normal range of motion. No JVD present.  Cardiovascular: Normal rate.   Pulmonary/Chest: Effort normal and breath sounds normal. No stridor.  Abdominal: Bowel sounds are normal. He exhibits no distension and no mass. There is no tenderness. There is no rebound and no guarding.  Musculoskeletal: Normal range of motion. He exhibits no edema.  No calf asymmetry, superficial collaterals, palpable cords, edema, Homans sign negative bilaterally.    Neurological: He is alert and oriented to person, place, and time.  Psychiatric: He has a normal mood and affect.  ED Course  Procedures (including critical care time)  Labs Reviewed  CBC WITH DIFFERENTIAL  COMPREHENSIVE METABOLIC PANEL   Dg Chest 2 View  06/13/2012   *RADIOLOGY REPORT*  Clinical Data: Chest pain, ex-smoker  CHEST - 2 VIEW  Comparison: None.  Findings: Normal cardiac silhouette.  There are low lung volumes. No effusion, infiltrate, or pneumothorax.  IMPRESSION: No acute cardiopulmonary process.   Original Report Authenticated By: Genevive Bi, M.D.     Date:  06/13/2012   Rate: 99  Rhythm: normal sinus rhythm  QRS Axis: normal  Intervals: normal  ST/T Wave abnormalities: normal  Conduction Disutrbances:none  Narrative Interpretation:   Old EKG Reviewed: None available     1. Chest pain       MDM   Filed Vitals:   06/13/12 1835  BP: 138/106  Pulse: 105  Temp: 98.3 F (36.8 C)  TempSrc: Oral  Resp: 16  SpO2: 94%     JAYLAND NULL is a 52 y.o. male with atypical chest pain lasting only a few seconds, described as sharp. EKG is nonischemic, troponin is negative, chest x-ray shows no abnormalities. Patient is pain-free and full dose aspirin given. She is not anemic and CMP shows no abnormalities.  Patient does have several cardiac risk factors: Extensive smoking history, age and family history. Patient refuses admission for observation serial troponins. However him and his wife are amenable to delta troponin.  Case signed out to PA Paz at shift change. Plan is to f/o delta troponin and d/c with Cards follow up.   Medications  aspirin chewable tablet 324 mg (324 mg Oral Given 06/13/12 1917)    Wynetta Emery, PA-C 06/13/12 2039

## 2012-06-13 NOTE — ED Notes (Signed)
Pt states that he has been having midsternal CP that radiates to the L side and L arm for approx. 7 hrs. Pt also states that at 3 oclock he broke out in a sweat. Denies cardiac probs but has a familial hx.

## 2012-06-13 NOTE — ED Provider Notes (Signed)
Medical screening examination/treatment/procedure(s) were performed by non-physician practitioner and as supervising physician I was immediately available for consultation/collaboration.   Loren Racer, MD 06/13/12 2312

## 2012-06-13 NOTE — ED Provider Notes (Signed)
9:33 PM  Care resumed by previous provider.  Patient presented to the emergency department complaining of atypical chest pain lasting seconds and intermittent in nature.  Chest pain started at noon and patient has been chest pain free for the last 3 hours.  Patient did not have a cardiac history, but does have positive family history as well as 40 pack year smoking history.  Plan per previous provider is to have delta troponin drawn at 10 p.m. patient is to followup with outpatient stress test is normal.  Previous provider discussed plan with attending who is agreeable with workup thus far as patient is low risk. Pt re-evaluated & on exam: pt is chest pain free, NAD, heart w/ RRR, lungs CTAB, Chest & abd non-tender, no peripheral edema or calf tenderness. Vitals reviewed and hight blood pressure discussed with patient advising re-check this week. BP 138/106  Pulse 105  Temp(Src) 98.3 F (36.8 C) (Oral)  Resp 16  SpO2 94%   CBC    Component Value Date/Time   WBC 7.5 06/13/2012 1925   RBC 5.11 06/13/2012 1925   HGB 15.6 06/13/2012 1925   HCT 45.2 06/13/2012 1925   PLT 201 06/13/2012 1925   MCV 88.5 06/13/2012 1925   MCH 30.5 06/13/2012 1925   MCHC 34.5 06/13/2012 1925   RDW 13.3 06/13/2012 1925   LYMPHSABS 2.0 06/13/2012 1925   MONOABS 0.6 06/13/2012 1925   EOSABS 0.2 06/13/2012 1925   BASOSABS 0.0 06/13/2012 1925   10:59 PM second trop wnl Patient is to be discharged with recommendation to follow up with PCP in regards to today's hospital visit. Chest pain is not likely of cardiac or pulmonary etiology d/t atypical presentation, no tracheal deviation, no JVD or new murmur, RRR, breath sounds equal bilaterally, EKG without acute abnormalities, negative troponin x2, and negative CXR. Recommended OP stress test based on family hx & smoking hx to be scheduled by PCP. Pt has been advised start a PPI and return to the ED is CP becomes exertional, associated with diaphoresis or nausea, radiates to left jaw/arm,  worsens or becomes concerning in any way. Pt appears reliable for follow up and is agreeable to discharge.   Case has been discussed with and seen by Dr. Ranae Palms who agrees with the above plan to discharge.       Jaci Carrel, [place] 06/13/12 2259

## 2012-10-06 ENCOUNTER — Encounter (HOSPITAL_COMMUNITY): Payer: Self-pay | Admitting: Pharmacy Technician

## 2012-10-06 ENCOUNTER — Encounter (HOSPITAL_COMMUNITY): Payer: Self-pay | Admitting: Emergency Medicine

## 2012-10-06 ENCOUNTER — Inpatient Hospital Stay (HOSPITAL_COMMUNITY)
Admission: EM | Admit: 2012-10-06 | Discharge: 2012-10-08 | DRG: 853 | Disposition: A | Discharge status: Home / Self Care | Payer: BC Managed Care – PPO | Attending: Cardiology | Admitting: Cardiology

## 2012-10-06 ENCOUNTER — Emergency Department (HOSPITAL_COMMUNITY): Payer: BC Managed Care – PPO

## 2012-10-06 DIAGNOSIS — Z8249 Family history of ischemic heart disease and other diseases of the circulatory system: Secondary | ICD-10-CM

## 2012-10-06 DIAGNOSIS — I2510 Atherosclerotic heart disease of native coronary artery without angina pectoris: Secondary | ICD-10-CM | POA: Diagnosis present

## 2012-10-06 DIAGNOSIS — Z720 Tobacco use: Secondary | ICD-10-CM | POA: Diagnosis present

## 2012-10-06 DIAGNOSIS — I2109 ST elevation (STEMI) myocardial infarction involving other coronary artery of anterior wall: Secondary | ICD-10-CM | POA: Diagnosis present

## 2012-10-06 DIAGNOSIS — Z7982 Long term (current) use of aspirin: Secondary | ICD-10-CM

## 2012-10-06 DIAGNOSIS — Z87891 Personal history of nicotine dependence: Secondary | ICD-10-CM

## 2012-10-06 DIAGNOSIS — R079 Chest pain, unspecified: Secondary | ICD-10-CM

## 2012-10-06 DIAGNOSIS — E669 Obesity, unspecified: Secondary | ICD-10-CM | POA: Diagnosis present

## 2012-10-06 DIAGNOSIS — I214 Non-ST elevation (NSTEMI) myocardial infarction: Principal | ICD-10-CM | POA: Diagnosis present

## 2012-10-06 HISTORY — DX: ST elevation (STEMI) myocardial infarction involving other coronary artery of anterior wall: I21.09

## 2012-10-06 LAB — BASIC METABOLIC PANEL
Chloride: 101 mEq/L (ref 96–112)
Creatinine, Ser: 1.08 mg/dL (ref 0.50–1.35)
GFR calc Af Amer: 88 mL/min — ABNORMAL LOW (ref >90)

## 2012-10-06 LAB — CBC
MCV: 89.2 fL (ref 78.0–100.0)
Platelets: 255 10*3/uL (ref 150–400)
RDW: 13 % (ref 11.5–15.5)
WBC: 8.9 10*3/uL (ref 4.0–10.5)

## 2012-10-06 LAB — TROPONIN I: Troponin I: 0.83 ng/mL (ref <0.30)

## 2012-10-06 LAB — PRO B NATRIURETIC PEPTIDE: Pro B Natriuretic peptide (BNP): 34 pg/mL (ref 0–125)

## 2012-10-06 MED ORDER — ASPIRIN 81 MG PO CHEW
324.0000 mg | CHEWABLE_TABLET | Freq: Once | ORAL | Status: AC
  Administered 2012-10-06: 324 mg via ORAL
  Filled 2012-10-06: qty 4

## 2012-10-06 MED ORDER — NITROGLYCERIN 0.4 MG SL SUBL: 0.4000 mg | SUBLINGUAL_TABLET | SUBLINGUAL | Status: DC | PRN

## 2012-10-06 MED ORDER — HEPARIN BOLUS VIA INFUSION
4000.0000 [IU] | Freq: Once | INTRAVENOUS | Status: AC
  Administered 2012-10-07: 4000 [IU] via INTRAVENOUS
  Filled 2012-10-06: qty 4000

## 2012-10-06 MED ORDER — HEPARIN (PORCINE) IN NACL 100-0.45 UNIT/ML-% IJ SOLN
1400.0000 [IU]/h | INTRAMUSCULAR | Status: DC
  Administered 2012-10-07: 1250 [IU]/h via INTRAVENOUS
  Filled 2012-10-06 (×2): qty 250

## 2012-10-06 MED ORDER — NITROGLYCERIN 2 % TD OINT
1.0000 [in_us] | TOPICAL_OINTMENT | Freq: Four times a day (QID) | TRANSDERMAL | Status: DC
  Administered 2012-10-07: 1 [in_us] via TOPICAL
  Filled 2012-10-06: qty 30

## 2012-10-06 NOTE — ED Provider Notes (Addendum)
10:54 PM  Pt is having return of his chest pain. His EKG is unchanged. We'll give nitroglycerin. Cards at bedside.  Pt is hemodynamically stable.   Date: 10/06/2012 22:53  Rate: 73  Rhythm: normal sinus rhythm  QRS Axis: normal  Intervals: normal  ST/T Wave abnormalities: Very mild ST elevation in inferior leads with convex shape, T wave inversions in lateral leads,   Conduction Disutrbances: none  Narrative Interpretation: Very mild ST elevation in inferior leads with convex shape, T wave inversions in lateral leads, no change compared to prior EKG today  12:06 AM  Pt troponin is elevated at 0.83. He is currently chest pain-free after nitroglycerin.    Layla Maw Ward, DO 10/06/12 2335  Layla Maw Ward, DO 10/07/12 1610

## 2012-10-06 NOTE — H&P (Signed)
History and Physical  Patient ID: Wayne Jordan MRN: [id number], SOB: 02-Jul-1960 52 y.o. Date of Encounter: 10/06/2012, 11:42 PM  Primary Physician: Sissy Hoff, MD Primary Cardiologist: Dr. Mayford Knife  Chief Complaint: chest pain  HPI: 52 y.o. male w/ PMHx significant for tobacco use who presented to [facility] on 10/06/2012 with complaints of progressive chest pain.  Patient reports current symptomology started back in the end of May with some atypical chest pain symptoms seen in the [facility] ER. Evaluation at that time determined he was low risk with negative delta trop and he followed up with an outpatient exercise MPI. Do not have those results but based upon what they tell me, he exercised for approximately 10 minutes, did not have chest pain, but the imaging was non-diagnostic (?diaphragmatic attenuation?).    Over the next several weeks, his symptoms progressed to more typical symptoms of chest pain with exertion, dyspnea, and radiation to his left arm. Pain would last 5-10 minutes, relieved with rest. Previously able to mow the lawn in 45 minutes, now has to stop and rest due to the pain.    Based upon his escalating symptoms, he was scheduled to have an outpatient catheterization on 9/29. However, he came to the ER tonight due to symptoms at rest that lasted for > 45 minutes. Pain radiated to his left arm, assoc with diaphoresis.   In the ER, was given aspirin and was pain free. However, during interview, pain returned and recommendation for heparinization and nitro paste given.   EKG revealed NSR with no acute ST changes. Leads are misplaced. CXR was without acute cardiopulmonary abnormalities. Labs are significant for troponin POC of 0.06 (not diagnostic but nonzero)..   Past Medical History  Diagnosis Date  . Personal history of urinary calculi   . 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 Inguinal hernia repair  Right 03/08/2012    Procedure: HERNIA REPAIR INGUINAL ADULT;  Surgeon: Axel Filler, MD;  Location: [facility] OR;  Service: General;  Laterality: Right;  . Insertion of mesh N/A 03/08/2012    Procedure: INSERTION OF MESH;  Surgeon: Axel Filler, MD;  Location: [facility] OR;  Service: General;  Laterality: N/A;     Home Meds: Prior to Admission medications   Medication Sig Start Date End Date Taking? Authorizing Provider  aspirin 81 MG chewable tablet Chew 81 mg by mouth daily.   Yes Historical Provider, MD  ibuprofen (ADVIL,MOTRIN) 200 MG tablet Take 400 mg by mouth daily as needed for pain.   Yes Historical Provider, MD    Allergies:  Allergies  Allergen Reactions  . Coconut Fatty Acids Shortness Of Breath and Swelling  . Penicillins Hives and Rash    History   Social History  . Marital Status: Married    Spouse Name: N/A    Number of Children: N/A  . Years of Education: N/A   Occupational History  . Not on file.   Social History Main Topics  . Smoking status: Former Smoker -- 1.50 packs/day for 7 years    Types: Cigarettes    Quit date: 03/04/1995  . Smokeless tobacco: Not on file  . Alcohol Use: Yes  . Drug Use: No  . Sexual Activity: Not on file   Other Topics Concern  . Not on file   Social History Narrative  . No narrative on file     Family History  Problem Relation Age  of Onset  . Heart disease Father   Restarted smoking in the past 6 months.  Review of Systems: General: negative for chills, fever, night sweats or weight changes.  Cardiovascular: see HPI Dermatological: negative for rash Respiratory: negative for cough or wheezing Urologic: negative for hematuria Abdominal: negative for nausea, vomiting, diarrhea, bright red blood per rectum, melena, or hematemesis Neurologic: negative for visual changes, syncope, or dizziness All other systems reviewed and are otherwise negative except as noted above.  Labs:   Lab Results  Component Value Date   WBC  8.9 10/06/2012   HGB 16.7 10/06/2012   HCT 47.1 10/06/2012   MCV 89.2 10/06/2012   PLT 255 10/06/2012    Recent Labs Lab 10/06/12 2011  NA 138  K 4.6  CL 101  CO2 27  BUN 14  CREATININE 1.08  CALCIUM 9.9  GLUCOSE 104*   No results found for this basename: CKTOTAL, CKMB, TROPONINI,  in the last 72 hours No results found for this basename: CHOL, HDL, LDLCALC, TRIG   No results found for this basename: DDIMER    Radiology/Studies:  Dg Chest 2 View  10/06/2012   CLINICAL DATA:  Chest pain and shortness of Breath  EXAM: CHEST  2 VIEW  COMPARISON:  Jun 13, 2012  FINDINGS: Lungs are clear. Heart size and pulmonary vascularity are normal. No adenopathy. No pneumothorax. No bone lesions. .  IMPRESSION: No abnormality noted.   Electronically Signed   By: Bretta Bang   On: 10/06/2012 20:35     EKG: misplaced leads. Sinus, no ST elevation  Physical Exam: Blood pressure 127/83, pulse 78, temperature 97.8 F (36.6 C), temperature source Oral, resp. rate 11, SpO2 97.00%. General: Well developed, well nourished, in no acute distress. Head: Normocephalic, atraumatic, sclera non-icteric, nares are without discharge Neck: Supple. Negative for carotid bruits. JVD not elevated. Lungs: Clear bilaterally to auscultation without wheezes, rales, or rhonchi. Breathing is unlabored. Heart: RRR with S1 S2. No murmurs, rubs, or gallops appreciated. Abdomen: Soft, non-tender, non-distended with normoactive bowel sounds. No rebound/guarding. No obvious abdominal masses. Msk:  Strength and tone appear normal for age. Extremities: No edema. No clubbing or cyanosis. Distal pedal pulses are 2+ and equal bilaterally. Neuro: Alert and oriented X 3. Moves all extremities spontaneously. Psych:  Responds to questions appropriately with a normal affect.   Problem List 1. Chest pain, accelerating, very concerning for accelerating angina 2. Tobacco abuse 3. + Family history at advanced age  ASSESSMENT AND  PLAN:  52 y.o. male w/ PMHx significant for tobacco use who presented to [facility] on 10/06/2012 with complaints of progressive chest pain --> very concerning for accelerating angina.  Encouragingly, initial EKG is negative. Troponin is not diagnostic but concerning for elevation.  Aspirin, statin, nitro paste. Due to recurrence of pain, heparin gtt is recommended.  NPO for possible cath though may not occur as it is a Saturday and if stable, may be deferred to Monday.  Tobacco cessation strongly urged.  Pt is un-insured and him and his wife are anxious about the financial implications of the hospitalization and catheterization. SW visit may be helpful.  Prophylaxis: Heparin gtt NPO for ?procedure   Addendum:  Noted that troponin is elevated as I write this. --> NSTEMI. Continue current medications and plan.  Signed, Adolm [name], Troy Sine MD 10/06/2012, 11:42 PM

## 2012-10-06 NOTE — Progress Notes (Signed)
ANTICOAGULATION CONSULT NOTE - Initial Consult  Pharmacy Consult for heparin Indication: chest pain/ACS  Allergies  Allergen Reactions  . Coconut Fatty Acids Shortness Of Breath and Swelling  . Penicillins Hives and Rash    Patient Measurements:   Heparin Dosing Weight: 92.3 kg  Vital Signs: Temp: 97.8 F (36.6 C) (09/19 1946) Temp src: Oral (09/19 1946) BP: 127/83 mmHg (09/19 2230) Pulse Rate: 78 (09/19 2230)  Labs:  Recent Labs  10/06/12 2011  HGB 16.7  HCT 47.1  PLT 255  CREATININE 1.08    The CrCl is unknown because both a height and weight (above a minimum accepted value) are required for this calculation.   Medical History: Past Medical History  Diagnosis Date  . Personal history of urinary calculi   . 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  Initial troponin 0.06. Goal of Therapy:  Heparin level 0.3-0.7 units/ml Monitor platelets by anticoagulation protocol: Yes   Plan:  Heparin bolus 4000 units and drip at 1250 units/hr Check heparin level and CBC 6 hours after bolus and daily while on heparin.   Talbert Cage Poteet 10/06/2012,11:01 PM

## 2012-10-06 NOTE — ED Provider Notes (Signed)
TIME SEEN: 9:56 PM  CHIEF COMPLAINT: Chest pain  HPI: Patient is a 52 year-old male with a history of tobacco use and family history of CAD who presents emergency department for chest pain. He reports that he has had chest pain with mild exertion for the past 2-3 weeks associated with shortness of breath and nausea. He reports today he had an episode of chest pain described as a substernal pressure without radiation that started while at rest and lasted for approximately 45 minutes. His chest pain was associated with diaphoresis and nausea. He is been followed by Dr. Mayford Knife with [facility] cardiology and is scheduled to have a cardiac catheterization on 10/16/12. He reportedly had an inconclusive stress test recently. He states he is currently chest pain-free now after taking aspirin and ibuprofen at home. He denies any prior history of MI. No history of prior cardiac catheterization. No fever or cough. No lower extreme swelling or pain. No history of PE or DVT. No recent prolonged immobilization, fracture, trauma, surgery.  ROS: See HPI Constitutional: no fever  Eyes: no drainage  ENT: no runny nose   Cardiovascular:  chest pain  Resp: SOB  GI: no vomiting GU: no dysuria Integumentary: no rash  Allergy: no hives  Musculoskeletal: no leg swelling  Neurological: no slurred speech ROS otherwise negative  PAST MEDICAL HISTORY/PAST SURGICAL HISTORY:  Past Medical History  Diagnosis Date  . Personal history of urinary calculi   . 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 Authorizing Provider  aspirin 81 MG chewable tablet Chew 81 mg by mouth daily.   Yes Historical Provider, MD  ibuprofen (ADVIL,MOTRIN) 200 MG tablet Take 400 mg by mouth daily as needed for pain.   Yes Historical Provider, MD    ALLERGIES:  Allergies  Allergen Reactions  . Coconut Fatty Acids Shortness Of Breath and Swelling  . Penicillins  Hives and Rash    SOCIAL HISTORY:  History  Substance Use Topics  . Smoking status: Former Smoker -- 1.50 packs/day for 7 years    Types: Cigarettes    Quit date: 03/04/1995  . Smokeless tobacco: Not on file  . Alcohol Use: Yes    FAMILY HISTORY: Family History  Problem Relation Age of Onset  . Heart disease Father     EXAM: BP 127/88  Pulse 88  Temp(Src) 97.8 F (36.6 C) (Oral)  Resp 16  SpO2 99% CONSTITUTIONAL: Alert and oriented and responds appropriately to questions. Well-appearing; well-nourished HEAD: Normocephalic EYES: Conjunctivae clear, PERRL ENT: normal nose; no rhinorrhea; moist mucous membranes; pharynx without lesions noted NECK: Supple, no meningismus, no LAD  CARD: RRR; S1 and S2 appreciated; no murmurs, no clicks, no rubs, no gallops RESP: Normal chest excursion without splinting or tachypnea; breath sounds clear and equal bilaterally; no wheezes, no rhonchi, no rales,  ABD/GI: Normal bowel sounds; non-distended; soft, non-tender, no rebound, no guarding BACK:  The back appears normal and is non-tender to palpation, there is no CVA tenderness EXT: Normal ROM in all joints; non-tender to palpation; no edema; normal capillary refill; no cyanosis    SKIN: Normal color for age and race; warm NEURO: Moves all extremities equally PSYCH: The patient's mood and manner are appropriate. Grooming and personal hygiene are appropriate.  MEDICAL DECISION MAKING: Patient with concerning story for chest pain who is scheduled to have a cardiac catheterization in 10 days. His EKG shows very minimal ST  elevation in inferior leads and new T-wave inversions in aVL and flattening in lead one that is new compared to prior. Does not meet ST elevation MI criteria. He is currently chest pain-free and hemodynamically stable. Will give aspirin. Troponin is 0.06. Chest x-ray clear. Will discuss with cardiology for admission.   Spoke with Dr. Adolm [name] with cardiology who will see the  patient in the emergency department.  Layla Maw Mischele Detter, DO 10/06/12 2253

## 2012-10-06 NOTE — ED Notes (Signed)
Pt sts his CP is back. EDP Ward made aware.

## 2012-10-06 NOTE — ED Notes (Signed)
Pt. reports intermittent mid/upper chest pain for 2 weeks , slight SOB , nausea and diaphoresis , pt. took 2 Ibuprofen tabs prior to arrival , pt. is scheduled for cardiac catheterization this coming Sept. 29 by Dr. Mayford Knife.

## 2012-10-06 NOTE — ED Notes (Signed)
Dr. Elesa Massed made aware of pts Troponin level of 0.83.

## 2012-10-06 NOTE — ED Notes (Signed)
EKG shown to Dr. Elesa Massed

## 2012-10-07 ENCOUNTER — Encounter (HOSPITAL_COMMUNITY)
Admission: EM | Disposition: A | Discharge status: Home / Self Care | Payer: BC Managed Care – PPO | Source: Home / Self Care | Attending: Cardiology

## 2012-10-07 ENCOUNTER — Encounter (HOSPITAL_COMMUNITY): Payer: Self-pay

## 2012-10-07 DIAGNOSIS — I2109 ST elevation (STEMI) myocardial infarction involving other coronary artery of anterior wall: Secondary | ICD-10-CM | POA: Diagnosis present

## 2012-10-07 HISTORY — PX: PERCUTANEOUS STENT INTERVENTION: SHX5500

## 2012-10-07 HISTORY — PX: LEFT HEART CATHETERIZATION WITH CORONARY ANGIOGRAM: SHX5451

## 2012-10-07 LAB — BASIC METABOLIC PANEL
BUN: 13 mg/dL (ref 6–23)
CO2: 22 mEq/L (ref 19–32)
Chloride: 100 mEq/L (ref 96–112)
GFR calc Af Amer: >90 mL/min (ref >90)
Glucose, Bld: 105 mg/dL — ABNORMAL HIGH (ref 70–99)
Potassium: 3.6 mEq/L (ref 3.5–5.1)
Sodium: 135 mEq/L (ref 135–145)

## 2012-10-07 LAB — HEMOGLOBIN A1C
Hgb A1c MFr Bld: 5.6 % (ref <5.7)
Mean Plasma Glucose: 114 mg/dL (ref <117)

## 2012-10-07 LAB — CBC
HCT: 44.7 % (ref 39.0–52.0)
Hemoglobin: 15.7 g/dL (ref 13.0–17.0)
MCH: 31.2 pg (ref 26.0–34.0)
MCHC: 35.1 g/dL (ref 30.0–36.0)
MCV: 88.9 fL (ref 78.0–100.0)

## 2012-10-07 LAB — TROPONIN I: Troponin I: 1.71 ng/mL (ref <0.30)

## 2012-10-07 LAB — MRSA PCR SCREENING: MRSA by PCR: NEGATIVE

## 2012-10-07 LAB — LIPID PANEL
Cholesterol: 180 mg/dL (ref 0–200)
VLDL: 27 mg/dL (ref 0–40)

## 2012-10-07 LAB — CK TOTAL AND CKMB (NOT AT ARMC)
Relative Index: INVALID (ref 0.0–2.5)
Total CK: 97 U/L (ref 7–232)

## 2012-10-07 SURGERY — LEFT HEART CATHETERIZATION WITH CORONARY ANGIOGRAM
Anesthesia: LOCAL | Laterality: Right

## 2012-10-07 MED ORDER — ASPIRIN 81 MG PO CHEW
81.0000 mg | CHEWABLE_TABLET | Freq: Every day | ORAL | Status: DC
  Administered 2012-10-07 – 2012-10-08 (×2): 81 mg via ORAL
  Filled 2012-10-07 (×2): qty 1

## 2012-10-07 MED ORDER — ACETAMINOPHEN 325 MG PO TABS: 650.0000 mg | ORAL_TABLET | ORAL | Status: DC | PRN

## 2012-10-07 MED ORDER — FENTANYL CITRATE 0.05 MG/ML IJ SOLN
INTRAMUSCULAR | Status: AC
  Filled 2012-10-07: qty 2

## 2012-10-07 MED ORDER — METOPROLOL TARTRATE 12.5 MG HALF TABLET
12.5000 mg | ORAL_TABLET | Freq: Two times a day (BID) | ORAL | Status: DC
  Administered 2012-10-07 – 2012-10-08 (×3): 12.5 mg via ORAL
  Filled 2012-10-07 (×4): qty 1

## 2012-10-07 MED ORDER — ASPIRIN 81 MG PO CHEW: 324.0000 mg | CHEWABLE_TABLET | ORAL | Status: DC

## 2012-10-07 MED ORDER — DIAZEPAM 5 MG PO TABS
5.0000 mg | ORAL_TABLET | ORAL | Status: AC
  Administered 2012-10-07: 5 mg via ORAL

## 2012-10-07 MED ORDER — MIDAZOLAM HCL 2 MG/2ML IJ SOLN
INTRAMUSCULAR | Status: AC
  Filled 2012-10-07: qty 2

## 2012-10-07 MED ORDER — LIDOCAINE HCL (PF) 1 % IJ SOLN
INTRAMUSCULAR | Status: AC
  Filled 2012-10-07: qty 30

## 2012-10-07 MED ORDER — SODIUM CHLORIDE 0.9 % IJ SOLN: 3.0000 mL | INTRAMUSCULAR | Status: DC | PRN

## 2012-10-07 MED ORDER — ONDANSETRON HCL 4 MG/2ML IJ SOLN: 4.0000 mg | Freq: Four times a day (QID) | INTRAMUSCULAR | Status: DC | PRN

## 2012-10-07 MED ORDER — SODIUM CHLORIDE 0.9 % IJ SOLN: 3.0000 mL | Freq: Two times a day (BID) | INTRAMUSCULAR | Status: DC

## 2012-10-07 MED ORDER — SODIUM CHLORIDE 0.9 % IV SOLN: 1.0000 mL/kg/h | INTRAVENOUS | Status: AC

## 2012-10-07 MED ORDER — ASPIRIN 300 MG RE SUPP: 300.0000 mg | RECTAL | Status: DC

## 2012-10-07 MED ORDER — TICAGRELOR 90 MG PO TABS
ORAL_TABLET | ORAL | Status: AC
  Filled 2012-10-07: qty 2

## 2012-10-07 MED ORDER — DIAZEPAM 5 MG PO TABS
ORAL_TABLET | ORAL | Status: AC
  Administered 2012-10-07: 5 mg via ORAL
  Filled 2012-10-07: qty 1

## 2012-10-07 MED ORDER — ACETAMINOPHEN 325 MG PO TABS
650.0000 mg | ORAL_TABLET | ORAL | Status: DC | PRN
  Administered 2012-10-07: 650 mg via ORAL
  Filled 2012-10-07: qty 2

## 2012-10-07 MED ORDER — BIVALIRUDIN 250 MG IV SOLR
INTRAVENOUS | Status: AC
  Filled 2012-10-07: qty 250

## 2012-10-07 MED ORDER — ASPIRIN 81 MG PO CHEW
324.0000 mg | CHEWABLE_TABLET | ORAL | Status: AC
  Administered 2012-10-07: 324 mg via ORAL
  Filled 2012-10-07: qty 4

## 2012-10-07 MED ORDER — NITROGLYCERIN 0.4 MG SL SUBL: 0.4000 mg | SUBLINGUAL_TABLET | SUBLINGUAL | Status: DC | PRN

## 2012-10-07 MED ORDER — SIMVASTATIN 40 MG PO TABS
40.0000 mg | ORAL_TABLET | Freq: Every day | ORAL | Status: DC
  Administered 2012-10-07: 40 mg via ORAL
  Filled 2012-10-07 (×3): qty 1

## 2012-10-07 MED ORDER — SODIUM CHLORIDE 0.9 % IV SOLN: 250.0000 mL | INTRAVENOUS | Status: DC | PRN

## 2012-10-07 MED ORDER — HEPARIN (PORCINE) IN NACL 2-0.9 UNIT/ML-% IJ SOLN
INTRAMUSCULAR | Status: AC
  Filled 2012-10-07: qty 1000

## 2012-10-07 MED ORDER — VERAPAMIL HCL 2.5 MG/ML IV SOLN
INTRAVENOUS | Status: AC
  Filled 2012-10-07: qty 2

## 2012-10-07 MED ORDER — NITROGLYCERIN 0.2 MG/ML ON CALL CATH LAB
INTRAVENOUS | Status: AC
  Filled 2012-10-07: qty 1

## 2012-10-07 MED ORDER — ASPIRIN EC 81 MG PO TBEC
81.0000 mg | DELAYED_RELEASE_TABLET | Freq: Every day | ORAL | Status: DC
  Administered 2012-10-07: 81 mg via ORAL
  Filled 2012-10-07: qty 1

## 2012-10-07 MED ORDER — TICAGRELOR 90 MG PO TABS
90.0000 mg | ORAL_TABLET | Freq: Two times a day (BID) | ORAL | Status: DC
  Administered 2012-10-07 – 2012-10-08 (×3): 90 mg via ORAL
  Filled 2012-10-07 (×4): qty 1

## 2012-10-07 MED ORDER — HEPARIN SODIUM (PORCINE) 1000 UNIT/ML IJ SOLN
INTRAMUSCULAR | Status: AC
  Filled 2012-10-07: qty 1

## 2012-10-07 NOTE — CV Procedure (Signed)
PROCEDURE:  Left heart catheterization with selective coronary angiography, left ventriculogram.  PCI proximal LAD  INDICATIONS:  Anterior MI  The risks, benefits, and details of the procedure were explained to the patient.  The patient verbalized understanding and wanted to proceed.  Informed written consent was obtained.  PROCEDURE TECHNIQUE:  After Xylocaine anesthesia a 4F slender sheath was placed in the right radial artery with a single anterior needle wall stick.   Left coronary angiography was done using a Voda 3 guide catheter.  Right coronary angiography was done using a Judkins R4 guide catheter.  Left ventriculography was done using a pigtail catheter.    CONTRAST:  Total of 140 cc.  COMPLICATIONS:  None.    HEMODYNAMICS:  Aortic pressure was 118/79; LV pressure was 123/8; LVEDP 14.  There was no gradient between the left ventricle and aorta.    ANGIOGRAPHIC DATA:   The left main coronary artery is widely patent.  The left anterior descending artery is a large vessel which reaches the apex.  In the proximal portion, there is an 80% stenosis followed by 90% stenosis.  The first diagonal is large with mild atherosclerosis.  The mid to distal LAD is widely patent.  There is what appears to be a septal vessel which takes off very early from the LAD, nearly from the distal left main.  The left circumflex artery is a large vessel.  There is a large OM1 with mild diffuse atherosclerosis.  The remainder of the circumflex small vessel and widely patent.  There is a ramus vessel which has mild proximal atherosclerosis.  The right coronary artery is a large dominant vessel.  In the mid to distal vessel, there is mild atherosclerosis.  The posterior lateral artery is a large branching vessel.  The posterior descending artery is a large vessel which is widely patent.  There are some collaterals to the apical LAD.  LEFT VENTRICULOGRAM:  Left ventricular angiogram was done in the 30 RAO  projection and revealed normal left ventricular wall motion and systolic function with an estimated ejection fraction of 55 %.  LVEDP was 14 mmHg.  PCI NARRATIVE: Angiomax was used for anticoagulation.  An ACT was used to check that the Angiomax is therapeutic.  A Voda 3 guide catheter was used to engage the left main.  A pro-water wire was placed across the area disease in the LAD.  A 2.5 x 20 balloon was used to predilate the area.  A 3.0 x 24 Promus drug-eluting stent was then deployed to 12 atmospheres.  A 3.25 post dilatation balloon was used to post dilate the proximal to midportion of the stent.  There is an excellent angiographic result.  There is no residual stenosis.  The distal portion of the stent was landed before the origin of the first diagonal.  Intracoronary nitroglycerin was administered.  IMPRESSIONS:  1. Normal left main coronary artery. 2. 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  This was successfully treated with a 3.0 x 24 Promus drug-eluting stent, postdilated to 3.3 mm in diameter. 3. Patent left circumflex artery and its branches. 4. Patent, large, dominant right coronary artery. 5. Normal left ventricular systolic function.  LVEDP 14 mmHg.  Ejection fraction 55 %.  RECOMMENDATION:  He'll be watched in step down.  He'll need dual antiplatelet therapy for at least a year.  If he does well, could consider discharge tomorrow.  He'll need aggressive secondary prevention including avoiding tobacco products, blood pressure  control, lipid-lowering therapy and beta blockade as tolerated.

## 2012-10-07 NOTE — Progress Notes (Signed)
Subjective:  Has chest discomfort with inspiration left upper chest. ECG with TWI precordial leads, new Trop elevated  Objective:  Vital Signs in the last 24 hours: Temp:  [97.6 F (36.4 C)-98.1 F (36.7 C)] 98.1 F (36.7 C) (09/20 1020) Pulse Rate:  [70-91] 87 (09/20 1020) Resp:  [11-22] 17 (09/20 1020) BP: (112-153)/(78-112) 116/82 mmHg (09/20 1146) SpO2:  [94 %-99 %] 95 % (09/20 1020) Weight:  [111.177 kg (245 lb 1.6 oz)] 111.177 kg (245 lb 1.6 oz) (09/20 0122)  Intake/Output from previous day:     Physical Exam: General: Well developed, well nourished, in no acute distress. Head:  Normocephalic and atraumatic. Lungs: Clear to auscultation and percussion. Heart: Normal S1 and S2.  No murmur, rubs or gallops.  Abdomen: soft, non-tender, positive bowel sounds. Overweight Extremities: No clubbing or cyanosis. No edema. Neurologic: Alert and oriented x 3.    Lab Results:  Recent Labs  10/06/12 2011 10/07/12 0520  WBC 8.9 7.6  HGB 16.7 15.7  PLT 255 214    Recent Labs  10/06/12 2011 10/07/12 0520  NA 138 135  K 4.6 3.6  CL 101 100  CO2 27 22  GLUCOSE 104* 105*  BUN 14 13  CREATININE 1.08 1.02    Recent Labs  10/07/12 0210 10/07/12 0855  TROPONINI 1.71* 1.31*   Hepatic Function Panel No results found for this basename: PROT, ALBUMIN, AST, ALT, ALKPHOS, BILITOT, BILIDIR, IBILI,  in the last 72 hours  Recent Labs  10/07/12 0520  CHOL 180   No results found for this basename: PROTIME,  in the last 72 hours  Imaging: Dg Chest 2 View  10/06/2012   CLINICAL DATA:  Chest pain and shortness of Breath  EXAM: CHEST  2 VIEW  COMPARISON:  Jun 13, 2012  FINDINGS: Lungs are clear. Heart size and pulmonary vascularity are normal. No adenopathy. No pneumothorax. No bone lesions. .  IMPRESSION: No abnormality noted.   Electronically Signed   By: Bretta Bang   On: 10/06/2012 20:35   Personally viewed.   Telemetry: No adverse rhythms Personally viewed.    EKG:  As above   Assessment/Plan:   52 year old NSTEMI, obese, abnormal ECG, father MI 48  -Given his pain, trending troponin upward, ECG (?LAD) will proceed to cath lab. -Discussed with family, Dr. Eldridge Dace. Risks and benefits including stroke, MI, CVA.  -IV heparin currently -Ate small breakfast this am. 8am Juice and 2 bites of sausage.     Wayne Jordan 10/07/2012, 11:54 AM

## 2012-10-07 NOTE — Progress Notes (Signed)
Pt. Troponin level was 1.71.  Dr. Adolm [name] ordered CK and CKMB levels with next troponin.

## 2012-10-07 NOTE — Progress Notes (Signed)
ANTICOAGULATION CONSULT NOTE   Pharmacy Consult for heparin Indication: chest pain/ACS  Allergies  Allergen Reactions  . Coconut Fatty Acids Shortness Of Breath and Swelling  . Penicillins Hives and Rash    Patient Measurements: Height: 6' (182.9 cm) Weight: 245 lb 1.6 oz (111.177 kg) IBW/kg (Calculated) : 77.6 Heparin Dosing Weight: 92.3 kg  Vital Signs: Temp: 97.8 F (36.6 C) (09/20 0638) Temp src: Oral (09/20 0638) BP: 135/88 mmHg (09/20 0638) Pulse Rate: 79 (09/20 0638)  Labs:  Recent Labs  10/06/12 2011 10/06/12 2234 10/07/12 0210 10/07/12 0520 10/07/12 0530  HGB 16.7  --   --  15.7  --   HCT 47.1  --   --  44.7  --   PLT 255  --   --  214  --   HEPARINUNFRC  --   --   --   --  0.26*  CREATININE 1.08  --   --  1.02  --   TROPONINI  --  0.83* 1.71*  --   --     Estimated Creatinine Clearance: 106.6 ml/min (by C-G formula based on Cr of 1.02).   Medical History: Past Medical History  Diagnosis Date  . Personal history of urinary calculi   . 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 aspirin 81 MG chewable tablet Chew 81 mg by mouth daily.      Marland Kitchen ibuprofen (ADVIL,MOTRIN) 200 MG tablet Take 400 mg by mouth daily as needed for pain.        Assessment: 52 yo man with h/o tobacco abuse and family h/o CAD to start heparin for CP. Troponin 0.06, 0.83, 1.71.  Initial heparin level 0.26 units/ml Goal of Therapy:  Heparin level 0.3-0.7 units/ml Monitor platelets by anticoagulation protocol: Yes   Plan:  Increase heparin to 1400 units/hr Check heparin level 6 hours after rate change   Herminia Warren Poteet 10/07/2012,7:24 AM

## 2012-10-08 DIAGNOSIS — Z720 Tobacco use: Secondary | ICD-10-CM | POA: Diagnosis present

## 2012-10-08 DIAGNOSIS — E669 Obesity, unspecified: Secondary | ICD-10-CM

## 2012-10-08 HISTORY — DX: Obesity, unspecified: E66.9

## 2012-10-08 LAB — CBC
Hemoglobin: 15.1 g/dL (ref 13.0–17.0)
MCHC: 34.6 g/dL (ref 30.0–36.0)
Platelets: 196 10*3/uL (ref 150–400)
RBC: 4.87 MIL/uL (ref 4.22–5.81)

## 2012-10-08 LAB — BASIC METABOLIC PANEL
CO2: 25 mEq/L (ref 19–32)
GFR calc non Af Amer: 74 mL/min — ABNORMAL LOW (ref >90)
Glucose, Bld: 96 mg/dL (ref 70–99)
Potassium: 3.9 mEq/L (ref 3.5–5.1)
Sodium: 138 mEq/L (ref 135–145)

## 2012-10-08 MED ORDER — TICAGRELOR 90 MG PO TABS: 90.0000 mg | ORAL_TABLET | Freq: Two times a day (BID) | ORAL | Status: DC

## 2012-10-08 MED ORDER — METOPROLOL TARTRATE 25 MG PO TABS: 25.0000 mg | ORAL_TABLET | Freq: Two times a day (BID) | ORAL | Status: DC

## 2012-10-08 MED ORDER — SIMVASTATIN 40 MG PO TABS: 40.0000 mg | ORAL_TABLET | Freq: Every day | ORAL | Status: DC

## 2012-10-08 MED ORDER — NITROGLYCERIN 0.4 MG SL SUBL: 0.4000 mg | SUBLINGUAL_TABLET | SUBLINGUAL | Status: DC | PRN

## 2012-10-08 NOTE — Care Management Note (Signed)
    Page 1 of 1   10/08/2012     12:15:35 PM   CARE MANAGEMENT NOTE 10/08/2012  Patient:  Wayne Jordan, Wayne Jordan   Account Number:  [id number]  Date Initiated:  10/08/2012  Documentation initiated by:  [facility]  Subjective/Objective Assessment:   UJW:JXBJY pain     Action/Plan:   discharge planning   Anticipated DC Date:  10/08/2012   Anticipated DC Plan:  HOME/SELF CARE         Choice offered to / List presented to:             Status of service:  Completed, signed off Medicare Important Message given?   (If response is "NO", the following Medicare IM given date fields will be blank) Date Medicare IM given:   Date Additional Medicare IM given:    Discharge Disposition:  HOME/SELF CARE  Per UR Regulation:    If discussed at Long Length of Stay Meetings, dates discussed:    Comments:  10/08/12 12:00 CM spoke with pt and pt's wife in room.  Pt lost his job and insurance in July and has been [employer]  to get new insurance. [facility] handout given to pt for followup care and to establish a primary care physician.  RN has already given pt Brilinta free trial card from MD office for 30 days.  Phone number for [employer] [place] [phone],  given to pt so he can call Monday, 10/09/12 to make an appt for a Navigator to help him "navigate" the system to get insurance.  No other CM needs were communicated.  Freddy Jaksch, BSN, CM [phone].

## 2012-10-08 NOTE — Progress Notes (Signed)
   CARE MANAGEMENT NOTE 10/08/2012  Patient:  Wayne Jordan, Wayne Jordan   Account Number:  [id number]  Date Initiated:  10/08/2012  Documentation initiated by:  [facility]  Subjective/Objective Assessment:   WUJ:WJXBJ pain     Action/Plan:   discharge planning   Anticipated DC Date:  10/08/2012   Anticipated DC Plan:  HOME/SELF CARE         Choice offered to / List presented to:             Status of service:  Completed, signed off Medicare Important Message given?   (If response is "NO", the following Medicare IM given date fields will be blank) Date Medicare IM given:   Date Additional Medicare IM given:    Discharge Disposition:  HOME/SELF CARE  Per UR Regulation:    If discussed at Long Length of Stay Meetings, dates discussed:    Comments:  10/08/12 12:00 CM spoke with pt and pt's wife in room.  Pt lost his job and insurance in July and has been [employer]  to get new insurance. [facility]out given to pt for followup care and to establish a primary care physician.  RN has already given pt Brilinta free trial card from MD office for 30 days.  Phone number for [employer] [place] [phone],  given to pt so he can call Monday, 10/09/12 to make an appt for a Navigator to help him "navigate" the system to get insurance.  No other CM needs were communicated.  Freddy Jaksch, BSN, CM [phone].

## 2012-10-08 NOTE — Discharge Summary (Addendum)
Physician Discharge Summary  Patient ID: Wayne Jordan MRN: [id number] DOB/AGE: 21-Jan-1960 52 y.o.  Admit date: 10/06/2012 Discharge date: 10/08/2012  Admission Diagnoses: Non-ST elevation myocardial infarction, LAD stent, DES  Discharge Diagnoses:   CAD, obesity, hypertension, tobacco cessation  Discharged Condition: Good, ambulating hallways well. No chest pain.  Hospital Course: HPI: 52 y.o. male w/ PMHx significant for tobacco use who presented to [facility] on 10/06/2012 with complaints of progressive chest pain. Patient reports current symptomology started back in the end of May with some atypical chest pain symptoms seen in the [facility] ER. Evaluation at that time determined he was low risk with negative delta trop and he followed up with an outpatient exercise MPI. Do not have those results but based upon what they tell me, he exercised for approximately 10 minutes, did not have chest pain, but the imaging was non-diagnostic (?diaphragmatic attenuation?).  Over the next several weeks, his symptoms progressed to more typical symptoms of chest pain with exertion, dyspnea, and radiation to his left arm. Pain would last 5-10 minutes, relieved with rest. Previously able to mow the lawn in 45 minutes, now has to stop and rest due to the pain.  Based upon his escalating symptoms, he was scheduled to have an outpatient catheterization on 9/29. However, he came to the ER tonight due to symptoms at rest that lasted for > 45 minutes. Pain radiated to his left arm, assoc with diaphoresis.  In the ER, was given aspirin and was pain free. However, during interview, pain returned and recommendation for heparinization and nitro paste given.  EKG revealed NSR with no acute ST changes. Leads are misplaced. CXR was without acute cardiopulmonary abnormalities. Labs are significant for troponin POC of 0.06 (not diagnostic but nonzero)..  Cardiac catheterization 10/07/12: IMPRESSIONS:  1. Normal left main  coronary artery. 2. 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 This was successfully treated with a 3.0 x 24 Promus drug-eluting stent, postdilated to 3.3 mm in diameter. 3. Patent left circumflex artery and its branches. 4. Patent, large, dominant right coronary artery. 5. Normal left ventricular systolic function. LVEDP 14 mmHg. Ejection fraction 55 %. RECOMMENDATION: He'll be watched in step down. He'll need dual antiplatelet therapy for at least a year. If he does well, could consider discharge tomorrow. He'll need aggressive secondary prevention including avoiding tobacco products, blood pressure control, lipid-lowering therapy and beta blockade as tolerated.    Discharge Exam: Blood pressure 154/79, pulse 68, temperature 97.7 F (36.5 C), temperature source Oral, resp. rate 16, height 6' (1.829 m), weight 111.177 kg (245 lb 1.6 oz), SpO2 93.00%.  General: Alert and oriented x3 in no acute distress, comfortable Cardiovascular: Regular rate and rhythm, no murmurs rubs or gallops Lungs: Clear to auscultation bilaterally no wheezes no rales Abdomen: Soft, nontender, normal bowel sounds Extremities: No edema, no clubbing, cardiac catheterization site clean, dry, intact normal distal pulses.  Disposition: 01-Home or Self Care  Discharge Orders   Future Orders Complete By Expires   Diet - low sodium heart healthy  As directed    Increase activity slowly  As directed        Medication List    STOP taking these medications       ibuprofen 200 MG tablet  Commonly known as:  ADVIL,MOTRIN      TAKE these medications       aspirin 81 MG chewable tablet  Chew 81 mg by mouth daily.  metoprolol tartrate 25 MG tablet  Commonly known as:  LOPRESSOR  Take 1 tablet (25 mg total) by mouth 2 (two) times daily.     nitroGLYCERIN 0.4 MG SL tablet  Commonly known as:  NITROSTAT  Place 1 tablet (0.4 mg total) under the tongue every 5 (five) minutes x 3  doses as needed for chest pain.     simvastatin 40 MG tablet  Commonly known as:  ZOCOR  Take 1 tablet (40 mg total) by mouth daily at 6 PM.     Ticagrelor 90 MG Tabs tablet  Commonly known as:  BRILINTA  Take 1 tablet (90 mg total) by mouth 2 (two) times daily.           Follow-up Information   Follow up with Quintella Reichert, MD. Call in 1 week.   Specialty:  Cardiology   Contact information:   [address] [place] [address] [phone]      He has done very well since stent placement. Feels good. Ready to go home. Cannulated the hallways without difficulty. Discussed with wife. Understands the importance of one year of dual antiplatelet therapy. Understands the importance of tobacco cessation.  SignedDonato Schultz 10/08/2012, 10:54 AM  Discharge time 35 minutes spent with patient, education, data review, catheterization review, nursing.  DUE TO COST - will switch as outpt to plavix after one month of free Brilinta. He says he has no insurance. ?[employer] listed. Dr. Mayford Knife can switch as outpt.

## 2012-10-09 LAB — POCT ACTIVATED CLOTTING TIME: Activated Clotting Time: 508 seconds

## 2012-10-09 MED FILL — Sodium Chloride IV Soln 0.9%: INTRAVENOUS | Qty: 50 | Status: AC

## 2012-10-16 ENCOUNTER — Ambulatory Visit (HOSPITAL_COMMUNITY): Admission: RE | Admit: 2012-10-16 | Payer: BC Managed Care – PPO | Source: Ambulatory Visit | Admitting: Cardiology

## 2012-10-16 ENCOUNTER — Encounter (HOSPITAL_COMMUNITY): Admission: RE | Payer: Self-pay | Source: Ambulatory Visit

## 2012-10-16 SURGERY — LEFT HEART CATHETERIZATION WITH CORONARY ANGIOGRAM
Anesthesia: LOCAL

## 2012-10-26 ENCOUNTER — Encounter (HOSPITAL_COMMUNITY)
Admission: RE | Admit: 2012-10-26 | Discharge: 2012-10-26 | Disposition: A | Discharge status: Home / Self Care | Payer: BC Managed Care – PPO | Source: Ambulatory Visit | Attending: Cardiology | Admitting: Cardiology

## 2012-10-26 ENCOUNTER — Ambulatory Visit (HOSPITAL_COMMUNITY): Payer: BC Managed Care – PPO

## 2012-10-26 DIAGNOSIS — Z9861 Coronary angioplasty status: Secondary | ICD-10-CM | POA: Insufficient documentation

## 2012-10-26 DIAGNOSIS — Z5189 Encounter for other specified aftercare: Secondary | ICD-10-CM | POA: Insufficient documentation

## 2012-10-26 DIAGNOSIS — I214 Non-ST elevation (NSTEMI) myocardial infarction: Secondary | ICD-10-CM | POA: Insufficient documentation

## 2012-10-26 NOTE — Progress Notes (Signed)
Cardiac Rehab Medication Review by a Pharmacist  Does the patient  feel that his/her medications are working for him/her?  yes  Has the patient been experiencing any side effects to the medications prescribed?  yes  Does the patient measure his/her own blood pressure or blood glucose at home?  no   Does the patient have any problems obtaining medications due to transportation or finances?   no  Understanding of regimen: excellent Understanding of indications: excellent Potential of compliance: excellent      Wayne Jordan 10/26/2012 8:06 AM

## 2012-10-30 ENCOUNTER — Encounter (HOSPITAL_COMMUNITY)
Admission: RE | Admit: 2012-10-30 | Discharge: 2012-10-30 | Disposition: A | Discharge status: Home / Self Care | Payer: BC Managed Care – PPO | Source: Ambulatory Visit | Attending: Cardiology | Admitting: Cardiology

## 2012-10-30 ENCOUNTER — Encounter (HOSPITAL_COMMUNITY): Payer: Self-pay

## 2012-10-30 NOTE — Progress Notes (Signed)
Pt started cardiac rehab today.  Pt tolerated light exercise without difficulty.  VSS, telemetry-sinus rhythm.  Asymptomatic.  PHQ-0.  Pt reports self reflected QOL scores indicate dissatisfaction with health symptoms however pt reports these symptoms have resolved with his intervention.  He describes these reported symptoms were all prior to his event. 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  Overall pt reports positive outlook with good coping skills.  Pt oriented to exercise equipment and routine.  Understanding verbalized.

## 2012-11-01 ENCOUNTER — Encounter (HOSPITAL_COMMUNITY)
Admission: RE | Admit: 2012-11-01 | Discharge: 2012-11-01 | Disposition: A | Discharge status: Home / Self Care | Payer: BC Managed Care – PPO | Source: Ambulatory Visit | Attending: Cardiology | Admitting: Cardiology

## 2012-11-03 ENCOUNTER — Encounter (HOSPITAL_COMMUNITY)
Admission: RE | Admit: 2012-11-03 | Discharge: 2012-11-03 | Disposition: A | Discharge status: Home / Self Care | Payer: BC Managed Care – PPO | Source: Ambulatory Visit | Attending: Cardiology | Admitting: Cardiology

## 2012-11-03 NOTE — Progress Notes (Signed)
Reviewed home exercise guidelines with patient including endpoints, temperature precautions, target heart rate and rate of perceived exertion. Pt plans to walk as his mode of home exercise. Pt voices understanding of instructions given.  Kawanda Drumheller, MS, ACSM CES  

## 2012-11-06 ENCOUNTER — Encounter (HOSPITAL_COMMUNITY)
Admission: RE | Admit: 2012-11-06 | Discharge: 2012-11-06 | Disposition: A | Discharge status: Home / Self Care | Payer: BC Managed Care – PPO | Source: Ambulatory Visit | Attending: Cardiology | Admitting: Cardiology

## 2012-11-08 ENCOUNTER — Encounter (HOSPITAL_COMMUNITY)
Admission: RE | Admit: 2012-11-08 | Discharge: 2012-11-08 | Disposition: A | Discharge status: Home / Self Care | Payer: BC Managed Care – PPO | Source: Ambulatory Visit | Attending: Cardiology | Admitting: Cardiology

## 2012-11-10 ENCOUNTER — Encounter (HOSPITAL_COMMUNITY)
Admission: RE | Admit: 2012-11-10 | Discharge: 2012-11-10 | Disposition: A | Discharge status: Home / Self Care | Payer: BC Managed Care – PPO | Source: Ambulatory Visit | Attending: Cardiology | Admitting: Cardiology

## 2012-11-13 ENCOUNTER — Encounter (HOSPITAL_COMMUNITY)
Admission: RE | Admit: 2012-11-13 | Discharge: 2012-11-13 | Disposition: A | Discharge status: Home / Self Care | Payer: BC Managed Care – PPO | Source: Ambulatory Visit | Attending: Cardiology | Admitting: Cardiology

## 2012-11-13 ENCOUNTER — Encounter (HOSPITAL_COMMUNITY): Payer: BC Managed Care – PPO

## 2012-11-15 ENCOUNTER — Encounter (HOSPITAL_COMMUNITY)
Admission: RE | Admit: 2012-11-15 | Discharge: 2012-11-15 | Disposition: A | Discharge status: Home / Self Care | Payer: BC Managed Care – PPO | Source: Ambulatory Visit | Attending: Cardiology | Admitting: Cardiology

## 2012-11-17 ENCOUNTER — Encounter (HOSPITAL_COMMUNITY)
Admission: RE | Admit: 2012-11-17 | Discharge: 2012-11-17 | Disposition: A | Discharge status: Home / Self Care | Payer: BC Managed Care – PPO | Source: Ambulatory Visit | Attending: Cardiology | Admitting: Cardiology

## 2012-11-20 ENCOUNTER — Encounter (HOSPITAL_COMMUNITY)
Admission: RE | Admit: 2012-11-20 | Discharge: 2012-11-20 | Disposition: A | Discharge status: Home / Self Care | Payer: BC Managed Care – PPO | Source: Ambulatory Visit | Attending: Cardiology | Admitting: Cardiology

## 2012-11-20 DIAGNOSIS — Z9861 Coronary angioplasty status: Secondary | ICD-10-CM | POA: Insufficient documentation

## 2012-11-20 DIAGNOSIS — Z5189 Encounter for other specified aftercare: Secondary | ICD-10-CM | POA: Insufficient documentation

## 2012-11-20 DIAGNOSIS — I214 Non-ST elevation (NSTEMI) myocardial infarction: Secondary | ICD-10-CM | POA: Insufficient documentation

## 2012-11-22 ENCOUNTER — Encounter (HOSPITAL_COMMUNITY)
Admission: RE | Admit: 2012-11-22 | Discharge: 2012-11-22 | Disposition: A | Discharge status: Home / Self Care | Payer: BC Managed Care – PPO | Source: Ambulatory Visit | Attending: Cardiology | Admitting: Cardiology

## 2012-11-24 ENCOUNTER — Encounter (HOSPITAL_COMMUNITY): Payer: BC Managed Care – PPO

## 2012-11-27 ENCOUNTER — Encounter (HOSPITAL_COMMUNITY)
Admission: RE | Admit: 2012-11-27 | Discharge: 2012-11-27 | Disposition: A | Discharge status: Home / Self Care | Payer: BC Managed Care – PPO | Source: Ambulatory Visit | Attending: Cardiology | Admitting: Cardiology

## 2012-11-27 ENCOUNTER — Ambulatory Visit: Payer: BC Managed Care – PPO | Admitting: *Deleted

## 2012-11-27 NOTE — Progress Notes (Addendum)
[name] 52 y.o. male Nutrition Note Spoke with pt.  Nutrition Plan and Nutrition Survey goals reviewed with pt. Pt is not following the Therapeutic Lifestyle Changes diet at this time. Pt states he wants to lose wt. Pt has been trying to lose wt by "exercising." Wt loss tips reviewed. Per nutrition screen, pt reports difficulty buying food. Per discussion, pt states "I have 5 children and eating healthier is more expensive." Cost-effective ways to eat healthy and keep grocery costs reasonable discussed.Pt expressed understanding of the information reviewed. Pt aware of nutrition education classes offered and has attended one Tuesday nutrition class and Heart Healthy Holiday Eating.  Nutrition Diagnosis   Food-and nutrition-related knowledge deficit related to lack of exposure to information as related to diagnosis of: ? CVD    Obesity related to excessive energy intake as evidenced by a BMI of 34.8    Nutrition RX/ Estimated Daily Nutrition Needs for: wt loss  1800-2300 Kcal, 50-65 gm fat, 12-16 gm sat fat, 1.8-2.3 gm trans-fat, <1500 mg sodium   Nutrition Intervention   Pt's individual nutrition plan including cholesterol goals reviewed with pt.   Benefits of adopting Therapeutic Lifestyle Changes discussed when Medficts reviewed.   Pt to attend the Portion Distortion class   Pt to attend the  ? Nutrition I class - met 10/30/12                    ? Nutrition II class   Continue client-centered nutrition education by RD, as part of interdisciplinary care.  Goal(s)   Pt to identify and limit food sources of saturated fat, trans fat, and cholesterol   Pt to identify food quantities necessary to achieve: ? wt loss to a goal wt of 228-246 lb (103.9-112.1 kg) at graduation from cardiac rehab.   Monitor and Evaluate progress toward nutrition goal with team. Nutrition Risk: Moderate  Mickle Plumb, M.Ed, RD, LDN, CDE 11/27/2012 9:30 AM

## 2012-11-29 ENCOUNTER — Encounter (HOSPITAL_COMMUNITY)
Admission: RE | Admit: 2012-11-29 | Discharge: 2012-11-29 | Disposition: A | Discharge status: Home / Self Care | Payer: BC Managed Care – PPO | Source: Ambulatory Visit | Attending: Cardiology | Admitting: Cardiology

## 2012-12-01 ENCOUNTER — Encounter (HOSPITAL_COMMUNITY)
Admission: RE | Admit: 2012-12-01 | Discharge: 2012-12-01 | Disposition: A | Discharge status: Home / Self Care | Payer: BC Managed Care – PPO | Source: Ambulatory Visit | Attending: Cardiology | Admitting: Cardiology

## 2012-12-04 ENCOUNTER — Encounter (HOSPITAL_COMMUNITY)
Admission: RE | Admit: 2012-12-04 | Discharge: 2012-12-04 | Disposition: A | Discharge status: Home / Self Care | Payer: BC Managed Care – PPO | Source: Ambulatory Visit | Attending: Cardiology | Admitting: Cardiology

## 2012-12-06 ENCOUNTER — Telehealth: Payer: Self-pay

## 2012-12-06 ENCOUNTER — Encounter (HOSPITAL_COMMUNITY)
Admission: RE | Admit: 2012-12-06 | Discharge: 2012-12-06 | Disposition: A | Discharge status: Home / Self Care | Payer: BC Managed Care – PPO | Source: Ambulatory Visit | Attending: Cardiology | Admitting: Cardiology

## 2012-12-06 NOTE — Telephone Encounter (Signed)
Patient came to the office to get brilinta samples,

## 2012-12-08 ENCOUNTER — Encounter (HOSPITAL_COMMUNITY)
Admission: RE | Admit: 2012-12-08 | Discharge: 2012-12-08 | Disposition: A | Discharge status: Home / Self Care | Payer: BC Managed Care – PPO | Source: Ambulatory Visit | Attending: Cardiology | Admitting: Cardiology

## 2012-12-08 ENCOUNTER — Other Ambulatory Visit (INDEPENDENT_AMBULATORY_CARE_PROVIDER_SITE_OTHER): Payer: BC Managed Care – PPO

## 2012-12-08 DIAGNOSIS — I2109 ST elevation (STEMI) myocardial infarction involving other coronary artery of anterior wall: Secondary | ICD-10-CM

## 2012-12-08 DIAGNOSIS — E669 Obesity, unspecified: Secondary | ICD-10-CM

## 2012-12-08 LAB — LIPID PANEL
Cholesterol: 138 mg/dL (ref 0–200)
LDL Cholesterol: 71 mg/dL (ref 0–99)
Triglycerides: 142 mg/dL (ref 0.0–149.0)
VLDL: 28.4 mg/dL (ref 0.0–40.0)

## 2012-12-08 LAB — ALT: ALT: 73 U/L — ABNORMAL HIGH (ref 0–53)

## 2012-12-11 ENCOUNTER — Encounter (HOSPITAL_COMMUNITY)
Admission: RE | Admit: 2012-12-11 | Discharge: 2012-12-11 | Disposition: A | Discharge status: Home / Self Care | Payer: BC Managed Care – PPO | Source: Ambulatory Visit | Attending: Cardiology | Admitting: Cardiology

## 2012-12-13 ENCOUNTER — Encounter (HOSPITAL_COMMUNITY)
Admission: RE | Admit: 2012-12-13 | Discharge: 2012-12-13 | Disposition: A | Discharge status: Home / Self Care | Payer: BC Managed Care – PPO | Source: Ambulatory Visit | Attending: Cardiology | Admitting: Cardiology

## 2012-12-15 ENCOUNTER — Encounter (HOSPITAL_COMMUNITY): Payer: BC Managed Care – PPO

## 2012-12-18 ENCOUNTER — Telehealth: Payer: Self-pay | Admitting: General Surgery

## 2012-12-18 ENCOUNTER — Encounter: Payer: Self-pay | Admitting: General Surgery

## 2012-12-18 ENCOUNTER — Encounter (HOSPITAL_COMMUNITY)
Admission: RE | Admit: 2012-12-18 | Discharge: 2012-12-18 | Disposition: A | Discharge status: Home / Self Care | Payer: BC Managed Care – PPO | Source: Ambulatory Visit | Attending: Cardiology | Admitting: Cardiology

## 2012-12-18 DIAGNOSIS — Z79899 Other long term (current) drug therapy: Secondary | ICD-10-CM

## 2012-12-18 DIAGNOSIS — I2510 Atherosclerotic heart disease of native coronary artery without angina pectoris: Secondary | ICD-10-CM

## 2012-12-18 DIAGNOSIS — Z9861 Coronary angioplasty status: Secondary | ICD-10-CM | POA: Insufficient documentation

## 2012-12-18 DIAGNOSIS — I214 Non-ST elevation (NSTEMI) myocardial infarction: Secondary | ICD-10-CM | POA: Insufficient documentation

## 2012-12-18 DIAGNOSIS — Z5189 Encounter for other specified aftercare: Secondary | ICD-10-CM | POA: Insufficient documentation

## 2012-12-18 NOTE — Telephone Encounter (Signed)
Message copied by Nita Sells on Mon Dec 18, 2012  1:27 PM ------      Message from: SMART, Gaspar Skeeters      Created: Mon Dec 18, 2012  8:55 AM       RF:  Recent MI with PCI, tobacco use, family h/o CAD, age - LDL goal < 70, non-HDL goal < 100 mg/dL.      Meds:  Simvastatin 40 mg qd started 09/2012.      LDL dropped from 117 mg/dL in 01/6107 (time of MI/PCI) to 71 mg/dL today.  Non-HDL is also 99 mg/L which is good.  Given CAD at early age and family history of CAD, will check an NMR in 3 months.  If LDL-P > 1000 nmol/L will consider high dose atorvastatin or vytorin at that time.      ALT slightly elevated so will recheck this in a few weeks.      Plan:      1.  Continue simvastatin given recent MI/PCI.      2.  Limit tylenol and alcohol use given ALT slightly elevated.      3.  Recheck Hepatic panel in 3 weeks.      4.  Check an NMR LipoProfile and CMET in 3 months.      Please notify patient, update meds, and set up both sets of labs. Thanks. ------

## 2012-12-20 ENCOUNTER — Encounter (HOSPITAL_COMMUNITY)
Admission: RE | Admit: 2012-12-20 | Discharge: 2012-12-20 | Disposition: A | Discharge status: Home / Self Care | Payer: BC Managed Care – PPO | Source: Ambulatory Visit | Attending: Cardiology | Admitting: Cardiology

## 2012-12-22 ENCOUNTER — Encounter (HOSPITAL_COMMUNITY)
Admission: RE | Admit: 2012-12-22 | Discharge: 2012-12-22 | Disposition: A | Discharge status: Home / Self Care | Payer: BC Managed Care – PPO | Source: Ambulatory Visit | Attending: Cardiology | Admitting: Cardiology

## 2012-12-25 ENCOUNTER — Encounter (HOSPITAL_COMMUNITY)
Admission: RE | Admit: 2012-12-25 | Discharge: 2012-12-25 | Disposition: A | Discharge status: Home / Self Care | Payer: BC Managed Care – PPO | Source: Ambulatory Visit | Attending: Cardiology | Admitting: Cardiology

## 2012-12-27 ENCOUNTER — Encounter (HOSPITAL_COMMUNITY)
Admission: RE | Admit: 2012-12-27 | Discharge: 2012-12-27 | Disposition: A | Discharge status: Home / Self Care | Payer: BC Managed Care – PPO | Source: Ambulatory Visit | Attending: Cardiology | Admitting: Cardiology

## 2012-12-29 ENCOUNTER — Encounter (HOSPITAL_COMMUNITY)
Admission: RE | Admit: 2012-12-29 | Discharge: 2012-12-29 | Disposition: A | Discharge status: Home / Self Care | Payer: BC Managed Care – PPO | Source: Ambulatory Visit | Attending: Cardiology | Admitting: Cardiology

## 2013-01-01 ENCOUNTER — Encounter (HOSPITAL_COMMUNITY)
Admission: RE | Admit: 2013-01-01 | Discharge: 2013-01-01 | Disposition: A | Discharge status: Home / Self Care | Payer: BC Managed Care – PPO | Source: Ambulatory Visit | Attending: Cardiology | Admitting: Cardiology

## 2013-01-03 ENCOUNTER — Encounter (HOSPITAL_COMMUNITY)
Admission: RE | Admit: 2013-01-03 | Discharge: 2013-01-03 | Disposition: A | Discharge status: Home / Self Care | Payer: BC Managed Care – PPO | Source: Ambulatory Visit | Attending: Cardiology | Admitting: Cardiology

## 2013-01-05 ENCOUNTER — Encounter (HOSPITAL_COMMUNITY)
Admission: RE | Admit: 2013-01-05 | Discharge: 2013-01-05 | Disposition: A | Discharge status: Home / Self Care | Payer: BC Managed Care – PPO | Source: Ambulatory Visit | Attending: Cardiology | Admitting: Cardiology

## 2013-01-08 ENCOUNTER — Encounter: Payer: Self-pay | Admitting: General Surgery

## 2013-01-08 ENCOUNTER — Telehealth: Payer: Self-pay

## 2013-01-08 ENCOUNTER — Other Ambulatory Visit (INDEPENDENT_AMBULATORY_CARE_PROVIDER_SITE_OTHER): Payer: BC Managed Care – PPO

## 2013-01-08 ENCOUNTER — Encounter (HOSPITAL_COMMUNITY)
Admission: RE | Admit: 2013-01-08 | Discharge: 2013-01-08 | Disposition: A | Discharge status: Home / Self Care | Payer: BC Managed Care – PPO | Source: Ambulatory Visit | Attending: Cardiology | Admitting: Cardiology

## 2013-01-08 DIAGNOSIS — I2510 Atherosclerotic heart disease of native coronary artery without angina pectoris: Secondary | ICD-10-CM

## 2013-01-08 DIAGNOSIS — Z79899 Other long term (current) drug therapy: Secondary | ICD-10-CM

## 2013-01-08 LAB — COMPREHENSIVE METABOLIC PANEL
ALT: 28 U/L (ref 0–53)
Albumin: 4.2 g/dL (ref 3.5–5.2)
BUN: 11 mg/dL (ref 6–23)
CO2: 26 mEq/L (ref 19–32)
Calcium: 9.1 mg/dL (ref 8.4–10.5)
Chloride: 107 mEq/L (ref 96–112)
Creatinine, Ser: 1 mg/dL (ref 0.4–1.5)
GFR: 83.63 mL/min (ref >60.00)
Glucose, Bld: 102 mg/dL — ABNORMAL HIGH (ref 70–99)
Potassium: 4.1 mEq/L (ref 3.5–5.1)
Total Bilirubin: 0.6 mg/dL (ref 0.3–1.2)

## 2013-01-08 LAB — HEPATIC FUNCTION PANEL
ALT: 28 U/L (ref 0–53)
AST: 23 U/L (ref 0–37)
Alkaline Phosphatase: 84 U/L (ref 39–117)
Bilirubin, Direct: 0.1 mg/dL (ref 0.0–0.3)
Total Bilirubin: 0.6 mg/dL (ref 0.3–1.2)
Total Protein: 7.3 g/dL (ref 6.0–8.3)

## 2013-01-08 NOTE — Telephone Encounter (Signed)
Patient was here for appointment and ask for brilinta samples samples took up front

## 2013-01-09 ENCOUNTER — Other Ambulatory Visit: Payer: Self-pay | Admitting: General Surgery

## 2013-01-09 ENCOUNTER — Encounter: Payer: Self-pay | Admitting: General Surgery

## 2013-01-09 DIAGNOSIS — E785 Hyperlipidemia, unspecified: Secondary | ICD-10-CM

## 2013-01-09 LAB — NMR LIPOPROFILE WITH LIPIDS
Cholesterol, Total: 121 mg/dL (ref <200)
HDL Particle Number: 33.2 umol/L (ref >=30.5)
HDL-C: 43 mg/dL (ref >=40)
LDL (calc): 41 mg/dL (ref <100)
LDL Size: 19.7 nm — ABNORMAL LOW (ref >20.5)
Large HDL-P: 2.2 umol/L — ABNORMAL LOW (ref >=4.8)
Large VLDL-P: 7.7 nmol/L — ABNORMAL HIGH (ref <=2.7)
Small LDL Particle Number: 587 nmol/L — ABNORMAL HIGH (ref <=527)
Triglycerides: 184 mg/dL — ABNORMAL HIGH (ref <150)
VLDL Size: 50.5 nm — ABNORMAL HIGH (ref <=46.6)

## 2013-01-10 ENCOUNTER — Encounter (HOSPITAL_COMMUNITY): Payer: BC Managed Care – PPO

## 2013-01-12 ENCOUNTER — Encounter (HOSPITAL_COMMUNITY): Payer: BC Managed Care – PPO

## 2013-01-15 ENCOUNTER — Encounter (HOSPITAL_COMMUNITY)
Admission: RE | Admit: 2013-01-15 | Discharge: 2013-01-15 | Disposition: A | Discharge status: Home / Self Care | Payer: BC Managed Care – PPO | Source: Ambulatory Visit | Attending: Cardiology | Admitting: Cardiology

## 2013-01-17 ENCOUNTER — Encounter (HOSPITAL_COMMUNITY)
Admission: RE | Admit: 2013-01-17 | Discharge: 2013-01-17 | Disposition: A | Discharge status: Home / Self Care | Payer: BC Managed Care – PPO | Source: Ambulatory Visit | Attending: Cardiology | Admitting: Cardiology

## 2013-01-19 ENCOUNTER — Encounter (HOSPITAL_COMMUNITY)
Admission: RE | Admit: 2013-01-19 | Discharge: 2013-01-19 | Disposition: A | Discharge status: Home / Self Care | Payer: BC Managed Care – PPO | Source: Ambulatory Visit | Attending: Cardiology | Admitting: Cardiology

## 2013-01-19 DIAGNOSIS — I214 Non-ST elevation (NSTEMI) myocardial infarction: Secondary | ICD-10-CM | POA: Insufficient documentation

## 2013-01-19 DIAGNOSIS — Z5189 Encounter for other specified aftercare: Secondary | ICD-10-CM | POA: Insufficient documentation

## 2013-01-19 DIAGNOSIS — Z9861 Coronary angioplasty status: Secondary | ICD-10-CM | POA: Insufficient documentation

## 2013-01-22 ENCOUNTER — Encounter (HOSPITAL_COMMUNITY)
Admission: RE | Admit: 2013-01-22 | Discharge: 2013-01-22 | Disposition: A | Discharge status: Home / Self Care | Payer: BC Managed Care – PPO | Source: Ambulatory Visit | Attending: Cardiology | Admitting: Cardiology

## 2013-01-24 ENCOUNTER — Encounter (HOSPITAL_COMMUNITY)
Admission: RE | Admit: 2013-01-24 | Discharge: 2013-01-24 | Disposition: A | Discharge status: Home / Self Care | Payer: BC Managed Care – PPO | Source: Ambulatory Visit | Attending: Cardiology | Admitting: Cardiology

## 2013-01-26 ENCOUNTER — Encounter (HOSPITAL_COMMUNITY)
Admission: RE | Admit: 2013-01-26 | Discharge: 2013-01-26 | Disposition: A | Discharge status: Home / Self Care | Payer: BC Managed Care – PPO | Source: Ambulatory Visit | Attending: Cardiology | Admitting: Cardiology

## 2013-01-29 ENCOUNTER — Encounter (HOSPITAL_COMMUNITY): Payer: Self-pay

## 2013-01-29 ENCOUNTER — Encounter (HOSPITAL_COMMUNITY)
Admission: RE | Admit: 2013-01-29 | Discharge: 2013-01-29 | Disposition: A | Discharge status: Home / Self Care | Payer: BC Managed Care – PPO | Source: Ambulatory Visit | Attending: Cardiology | Admitting: Cardiology

## 2013-01-29 NOTE — Progress Notes (Signed)
Pt graduated from cardiac rehab program today.  Medication list reconciled.  PHQ9 score-0  .  Pt has made significant lifestyle changes and should be commended for his success. Pt plans to continue exercising on his own at home walking and sharing home gym with his neighbor.   Pt will need MD encouragement to continue exercise habits.

## 2013-01-31 ENCOUNTER — Encounter (HOSPITAL_COMMUNITY): Payer: BC Managed Care – PPO

## 2013-02-02 ENCOUNTER — Encounter (HOSPITAL_COMMUNITY): Admission: RE | Admit: 2013-02-02 | Payer: BC Managed Care – PPO | Source: Ambulatory Visit

## 2013-02-20 ENCOUNTER — Telehealth: Payer: Self-pay | Admitting: *Deleted

## 2013-02-20 NOTE — Telephone Encounter (Signed)
Patient came into office and requested brilinta samples. Samples provided.

## 2013-03-02 ENCOUNTER — Encounter: Payer: Self-pay | Admitting: Cardiology

## 2013-03-19 ENCOUNTER — Telehealth: Payer: Self-pay

## 2013-03-19 ENCOUNTER — Encounter (INDEPENDENT_AMBULATORY_CARE_PROVIDER_SITE_OTHER): Payer: Self-pay

## 2013-03-19 ENCOUNTER — Other Ambulatory Visit (INDEPENDENT_AMBULATORY_CARE_PROVIDER_SITE_OTHER): Payer: BC Managed Care – PPO

## 2013-03-19 DIAGNOSIS — E785 Hyperlipidemia, unspecified: Secondary | ICD-10-CM

## 2013-03-19 NOTE — Telephone Encounter (Signed)
Patient called requesting samples brilinta

## 2013-03-20 LAB — NMR LIPOPROFILE WITH LIPIDS
CHOLESTEROL, TOTAL: 119 mg/dL (ref <200)
HDL PARTICLE NUMBER: 31.4 umol/L (ref >=30.5)
HDL Size: 8.8 nm — ABNORMAL LOW (ref >=9.2)
HDL-C: 45 mg/dL (ref >=40)
LDL (calc): 39 mg/dL (ref <100)
LDL Particle Number: 717 nmol/L (ref <1000)
LDL Size: 20.1 nm — ABNORMAL LOW (ref >20.5)
LP-IR SCORE: 69 — AB (ref <=45)
Large HDL-P: 4.4 umol/L — ABNORMAL LOW (ref >=4.8)
Large VLDL-P: 9.1 nmol/L — ABNORMAL HIGH (ref <=2.7)
SMALL LDL PARTICLE NUMBER: 463 nmol/L (ref <=527)
Triglycerides: 177 mg/dL — ABNORMAL HIGH (ref <150)
VLDL Size: 51.3 nm — ABNORMAL HIGH (ref <=46.6)

## 2013-03-20 LAB — HEPATIC FUNCTION PANEL
ALK PHOS: 80 U/L (ref 39–117)
ALT: 34 U/L (ref 0–53)
AST: 28 U/L (ref 0–37)
Albumin: 4 g/dL (ref 3.5–5.2)
BILIRUBIN TOTAL: 1 mg/dL (ref 0.3–1.2)
Bilirubin, Direct: 0.1 mg/dL (ref 0.0–0.3)
Total Protein: 7.1 g/dL (ref 6.0–8.3)

## 2013-03-21 ENCOUNTER — Other Ambulatory Visit: Payer: Self-pay | Admitting: General Surgery

## 2013-03-21 ENCOUNTER — Encounter: Payer: Self-pay | Admitting: General Surgery

## 2013-03-21 DIAGNOSIS — E7800 Pure hypercholesterolemia, unspecified: Secondary | ICD-10-CM

## 2013-03-28 ENCOUNTER — Emergency Department (HOSPITAL_COMMUNITY)
Admission: EM | Admit: 2013-03-28 | Discharge: 2013-03-28 | Disposition: A | Discharge status: Home / Self Care | Payer: BC Managed Care – PPO | Source: Home / Self Care | Attending: Emergency Medicine | Admitting: Emergency Medicine

## 2013-03-28 ENCOUNTER — Emergency Department (HOSPITAL_COMMUNITY)
Admission: EM | Admit: 2013-03-28 | Discharge: 2013-03-28 | Disposition: A | Discharge status: Home / Self Care | Payer: BC Managed Care – PPO | Attending: Emergency Medicine | Admitting: Emergency Medicine

## 2013-03-28 ENCOUNTER — Encounter (HOSPITAL_COMMUNITY): Payer: Self-pay | Admitting: Emergency Medicine

## 2013-03-28 DIAGNOSIS — Z87442 Personal history of urinary calculi: Secondary | ICD-10-CM | POA: Insufficient documentation

## 2013-03-28 DIAGNOSIS — I252 Old myocardial infarction: Secondary | ICD-10-CM | POA: Insufficient documentation

## 2013-03-28 DIAGNOSIS — Z7982 Long term (current) use of aspirin: Secondary | ICD-10-CM | POA: Insufficient documentation

## 2013-03-28 DIAGNOSIS — Z880 Allergy status to penicillin: Secondary | ICD-10-CM | POA: Insufficient documentation

## 2013-03-28 DIAGNOSIS — Z87891 Personal history of nicotine dependence: Secondary | ICD-10-CM

## 2013-03-28 DIAGNOSIS — R42 Dizziness and giddiness: Secondary | ICD-10-CM | POA: Insufficient documentation

## 2013-03-28 DIAGNOSIS — Z79899 Other long term (current) drug therapy: Secondary | ICD-10-CM | POA: Insufficient documentation

## 2013-03-28 DIAGNOSIS — R040 Epistaxis: Secondary | ICD-10-CM | POA: Insufficient documentation

## 2013-03-28 DIAGNOSIS — Z792 Long term (current) use of antibiotics: Secondary | ICD-10-CM

## 2013-03-28 LAB — CBC WITH DIFFERENTIAL/PLATELET
BASOS ABS: 0 10*3/uL (ref 0.0–0.1)
BASOS PCT: 0 % (ref 0–1)
EOS ABS: 0.2 10*3/uL (ref 0.0–0.7)
EOS PCT: 3 % (ref 0–5)
HEMATOCRIT: 45 % (ref 39.0–52.0)
Hemoglobin: 15.5 g/dL (ref 13.0–17.0)
Lymphocytes Relative: 34 % (ref 12–46)
Lymphs Abs: 2.6 10*3/uL (ref 0.7–4.0)
MCH: 31.1 pg (ref 26.0–34.0)
MCHC: 34.4 g/dL (ref 30.0–36.0)
MCV: 90.2 fL (ref 78.0–100.0)
MONO ABS: 0.7 10*3/uL (ref 0.1–1.0)
Monocytes Relative: 9 % (ref 3–12)
Neutro Abs: 4.2 10*3/uL (ref 1.7–7.7)
Neutrophils Relative %: 54 % (ref 43–77)
Platelets: 234 10*3/uL (ref 150–400)
RBC: 4.99 MIL/uL (ref 4.22–5.81)
RDW: 13.4 % (ref 11.5–15.5)
WBC: 7.7 10*3/uL (ref 4.0–10.5)

## 2013-03-28 LAB — COMPREHENSIVE METABOLIC PANEL
ALBUMIN: 3.8 g/dL (ref 3.5–5.2)
ALT: 33 U/L (ref 0–53)
AST: 26 U/L (ref 0–37)
Alkaline Phosphatase: 96 U/L (ref 39–117)
BUN: 12 mg/dL (ref 6–23)
CALCIUM: 9.5 mg/dL (ref 8.4–10.5)
CO2: 21 mEq/L (ref 19–32)
CREATININE: 0.97 mg/dL (ref 0.50–1.35)
Chloride: 106 mEq/L (ref 96–112)
GFR calc Af Amer: >90 mL/min (ref >90)
GFR calc non Af Amer: >90 mL/min (ref >90)
Glucose, Bld: 126 mg/dL — ABNORMAL HIGH (ref 70–99)
Potassium: 4.4 mEq/L (ref 3.7–5.3)
SODIUM: 141 meq/L (ref 137–147)
TOTAL PROTEIN: 7.1 g/dL (ref 6.0–8.3)
Total Bilirubin: 0.5 mg/dL (ref 0.3–1.2)

## 2013-03-28 LAB — I-STAT TROPONIN, ED: Troponin i, poc: 0 ng/mL (ref 0.00–0.08)

## 2013-03-28 LAB — APTT: aPTT: 27 seconds (ref 24–37)

## 2013-03-28 LAB — PROTIME-INR
INR: 1.04 (ref 0.00–1.49)
Prothrombin Time: 13.4 seconds (ref 11.6–15.2)

## 2013-03-28 MED ORDER — SODIUM CHLORIDE 0.9 % IV BOLUS (SEPSIS)
1000.0000 mL | Freq: Once | INTRAVENOUS | Status: AC
  Administered 2013-03-28: 1000 mL via INTRAVENOUS

## 2013-03-28 MED ORDER — CLINDAMYCIN HCL 300 MG PO CAPS: 300.0000 mg | ORAL_CAPSULE | Freq: Three times a day (TID) | ORAL | Status: DC

## 2013-03-28 NOTE — ED Notes (Signed)
Pt was seen here earlier this morning for a nosebleed and is on brilinta. Had nasal packing and left. While out nose started bleeding again. Has currently stopped at this time.

## 2013-03-28 NOTE — ED Provider Notes (Signed)
CSN: [id number]     Arrival date & time 03/28/13  1124 History   First MD Initiated Contact with Patient 03/28/13 1239     Chief Complaint  Patient presents with  . Epistaxis     (Consider location/radiation/quality/duration/timing/severity/associated sxs/prior Treatment) The history is provided by the patient.  Wayne Jordan is a 53 y.o. male hx of MI on aspirin, brilinta here with nose bleed. Bilateral nose bleed last night and came to the ED. He had bilateral Rhino Rocket placed but it was still bleeding afterwards. ENT was called and Dr. Jenne PaneBates remove the left packing and was able to pack the right nose. He left the ER about 2 hours ago and then had one gush of blood follow up with some serosanguinous drainage. Denies swallowing blood. On clinda for prophylaxis.    Past Medical History  Diagnosis Date  . Personal history of urinary calculi   . 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 Acute myocardial infarction of other anterior wall, initial episode of care    Past Surgical History  Procedure Laterality Date  . Inguinal hernia repair Right 03/08/2012    Procedure: HERNIA REPAIR INGUINAL ADULT;  Surgeon: Axel FillerArmando Ramirez, MD;  [facility];  Service: General;  Laterality: Right;  . Insertion of mesh N/A 03/08/2012    Procedure: INSERTION OF MESH;  Surgeon: Axel FillerArmando Ramirez, MD;  Location: [facility] OR;  Service: General;  Laterality: N/A;   Family History  Problem Relation Age of Onset  . Heart disease Father    History  Substance Use Topics  . Smoking status: Former Smoker -- 1.50 packs/day for 7 years    Types: Cigarettes    Quit date: 10/01/2012  . Smokeless tobacco: Not on file  . Alcohol Use: Yes    Review of Systems  HENT: Positive for nosebleeds.   All other systems reviewed and are negative.      Allergies  Coconut fatty acids and Penicillins  Home Medications   Current Outpatient Rx  Name  Route  Sig  Dispense  Refill  . ascorbic acid (VITAMIN C) 500 MG tablet  Oral   Take 500 mg by mouth daily.         Marland Kitchen. aspirin 81 MG chewable tablet   Oral   Chew 81 mg by mouth daily.         . metoprolol tartrate (LOPRESSOR) 25 MG tablet   Oral   Take 1 tablet (25 mg total) by mouth 2 (two) times daily.   60 tablet   12   . nitroGLYCERIN (NITROSTAT) 0.4 MG SL tablet   Sublingual   Place 1 tablet (0.4 mg total) under the tongue every 5 (five) minutes x 3 doses as needed for chest pain.   30 tablet   12   . simvastatin (ZOCOR) 40 MG tablet   Oral   Take 1 tablet (40 mg total) by mouth daily at 6 PM.   30 tablet   12   . Ticagrelor (BRILINTA) 90 MG TABS tablet   Oral   Take 1 tablet (90 mg total) by mouth 2 (two) times daily.   60 tablet   12   . clindamycin (CLEOCIN) 300 MG capsule   Oral   Take 1 capsule (300 mg total) by mouth 3 (three) times daily.   9 capsule   0    BP 132/105  Pulse 81  Temp(Src) 97.7 F (36.5 C) (Oral)  Resp 20  Ht 6' (1.829 m)  Wt 255 lb (115.667 kg)  BMI 34.58 kg/m2  SpO2 94% Physical Exam  Vitals reviewed. Constitutional: He is oriented to person, place, and time. He appears well-developed and well-nourished.  HENT:  Head: Normocephalic.  Right Ear: External ear normal.  Mouth/Throat: Oropharynx is clear and moist.  R packing in place. Serosanguinous drainage. No bleeding in OP   Eyes: Conjunctivae are normal. Pupils are equal, round, and reactive to light.  Neck: Normal range of motion. Neck supple.  Cardiovascular: Normal rate.   Pulmonary/Chest: Effort normal.  Abdominal: Soft.  Musculoskeletal: Normal range of motion.  Neurological: He is alert and oriented to person, place, and time.  Skin: Skin is warm and dry.  Psychiatric: He has a normal mood and affect. His behavior is normal. Judgment and thought content normal.    ED Course  Procedures (including critical care time) Labs Review Labs Reviewed - No data to display Imaging Review No results found.   EKG Interpretation None       MDM   Final diagnoses:  None   Wayne Jordan is a 53 y.o. male here with nose bleed after packing. No active bleeding, just serosanguinous drainage. I called Dr. Lazarus Salines, who is on call for Dr. Jenne Pane. He states that serosanguinous drainage is expected. He will f/u with Dr. Jenne Pane in 2 days.      Richardean Canal, MD 03/28/13 1302

## 2013-03-28 NOTE — Discharge Instructions (Signed)
Dr. Jenne PaneBates will call you regarding Brilinta, aspirin.   Follow up with Dr. Jenne PaneBates in 2 days.   Return to ER if you have worse bleeding.

## 2013-03-28 NOTE — Procedures (Signed)
Preop diagnosis: Right posterior epistaxis Postop diagnosis: Same Procedure:  Right anterior/posterior nasal packing Surgeon: Jenne PaneBates Anesth: Topical 4% lidocaine Compl: None Findings: After removal of both anterior packs, there was no active bleeding.  Trying to suction the right nasal passage resulted in marked gagging. Description:  The right nasal pack was removed.  The right nasal passage was sprayed with 4% lidocaine.  A 10 cm Merocel pack was cut down by about 1.5 cm and coated with antibiotic ointment.  The pack was passed into the right nasal passage until fully in place.  The pack was sprayed with lidocaine.  The patient tolerated the procedure.

## 2013-03-28 NOTE — Discharge Instructions (Signed)
Take the medicine prescribed by Dr. Jenne PaneBates. Follow up for further treatment as directed by Dr. Jenne PaneBates.    Nosebleed A nosebleed can be caused by many things, including:  Getting hit hard in the nose.  Infections.  Dry nose.  Colds.  Medicines. Your doctor may do lab testing if you get nosebleeds a lot and the cause is not known. HOME CARE   If your nose was packed with material, keep it there until your doctor takes it out. Put the pack back in your nose if the pack falls out.  Do not blow your nose for 12 hours after the nosebleed.  Sit up and bend forward if your nose starts bleeding again. Pinch the front half of your nose nonstop for 20 minutes.  Put petroleum jelly inside your nose every morning if you have a dry nose.  Use a humidifier to make the air less dry.  Do not take aspirin.  Try not to strain, lift, or bend at the waist for many days after the nosebleed. GET HELP RIGHT AWAY IF:   Nosebleeds keep happening and are hard to stop or control.  You have bleeding or bruises that are not normal on other parts of the body.  You have a fever.  The nosebleeds get worse.  You get lightheaded, feel faint, sweaty, or throw up (vomit) blood. MAKE SURE YOU:   Understand these instructions.  Will watch your condition.  Will get help right away if you are not doing well or get worse. Document Released: 10/14/2007 Document Revised: 03/29/2011 Document Reviewed: 10/14/2007 [facility] Patient Information 2014 [place].

## 2013-03-28 NOTE — ED Notes (Signed)
Patient driving, sneezed and spontaneously started bleeding from nose.  Patient just recently started on blood thinner, Brilinta after MI.  Patient still actively bleeding after basic nosebleed therapy and two sprays of afrin with EMS.  Patient continues with active bleeding and some dizziness.  Patient is CAOx3.

## 2013-03-28 NOTE — Consult Note (Signed)
Reason for Consult:Epistaxis Referring Physician: ER  Wayne Jordan is an 53 y.o. male.  HPI: 53 year old male who takes ticagrelor following coronary stenting in September for MI was driving early this morning and sneezed.  He started bleeding heavily from the right side of his nose and could not get bleeding to stop with pinching and Afrin spray.  He came to the ER where bilateral anterior packs were placed and he continued to bleed down the throat.  By the time of my exam, bleeding had stopped.  He felt light-headed in the ER but had no chest pain.  Past Medical History  Diagnosis Date  . Personal history of urinary calculi   . 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 Acute myocardial infarction of other anterior wall, initial episode of care     Past Surgical History  Procedure Laterality Date  . Inguinal hernia repair Right 03/08/2012    Procedure: HERNIA REPAIR INGUINAL ADULT;  Surgeon: Ralene Ok, MD;  Location: [place];  Service: General;  Laterality: Right;  . Insertion of mesh N/A 03/08/2012    Procedure: INSERTION OF MESH;  Surgeon: Ralene Ok, MD;  Location: [place];  Service: General;  Laterality: N/A;    Family History  Problem Relation Age of Onset  . Heart disease Father     Social History:  reports that he quit smoking about 5 months ago. His smoking use included Cigarettes. He has a 10.5 pack-year smoking history. He does not have any smokeless tobacco history on file. He reports that he drinks alcohol. He reports that he does not use illicit drugs.  Allergies:  Allergies  Allergen Reactions  . Coconut Fatty Acids Shortness Of Breath and Swelling  . Penicillins Hives and Rash    Medications: I have reviewed the patient's current medications.  Results for orders placed during the hospital encounter of 03/28/13 (from the past 48 hour(s))  CBC WITH DIFFERENTIAL     Status: None   Collection Time    03/28/13  6:26 AM      Result Value Ref Range   WBC 7.7  4.0 -  10.5 K/uL   RBC 4.99  4.22 - 5.81 MIL/uL   Hemoglobin 15.5  13.0 - 17.0 g/dL   HCT 45.0  39.0 - 52.0 %   MCV 90.2  78.0 - 100.0 fL   MCH 31.1  26.0 - 34.0 pg   MCHC 34.4  30.0 - 36.0 g/dL   RDW 13.4  11.5 - 15.5 %   Platelets 234  150 - 400 K/uL   Neutrophils Relative % 54  43 - 77 %   Neutro Abs 4.2  1.7 - 7.7 K/uL   Lymphocytes Relative 34  12 - 46 %   Lymphs Abs 2.6  0.7 - 4.0 K/uL   Monocytes Relative 9  3 - 12 %   Monocytes Absolute 0.7  0.1 - 1.0 K/uL   Eosinophils Relative 3  0 - 5 %   Eosinophils Absolute 0.2  0.0 - 0.7 K/uL   Basophils Relative 0  0 - 1 %   Basophils Absolute 0.0  0.0 - 0.1 K/uL  COMPREHENSIVE METABOLIC PANEL     Status: Abnormal   Collection Time    03/28/13  6:26 AM      Result Value Ref Range   Sodium 141  137 - 147 mEq/L   Potassium 4.4  3.7 - 5.3 mEq/L   Chloride 106  96 - 112 mEq/L  CO2 21  19 - 32 mEq/L   Glucose, Bld 126 (*) 70 - 99 mg/dL   BUN 12  6 - 23 mg/dL   Creatinine, Ser 0.97  0.50 - 1.35 mg/dL   Calcium 9.5  8.4 - 10.5 mg/dL   Total Protein 7.1  6.0 - 8.3 g/dL   Albumin 3.8  3.5 - 5.2 g/dL   AST 26  0 - 37 U/L   ALT 33  0 - 53 U/L   Alkaline Phosphatase 96  39 - 117 U/L   Total Bilirubin 0.5  0.3 - 1.2 mg/dL   GFR calc non Af Amer >90  >90 mL/min   GFR calc Af Amer >90  >90 mL/min   Comment: (NOTE)     The eGFR has been calculated using the CKD EPI equation.     This calculation has not been validated in all clinical situations.     eGFR's persistently <90 mL/min signify possible Chronic Kidney     Disease.  PROTIME-INR     Status: None   Collection Time    03/28/13  6:26 AM      Result Value Ref Range   Prothrombin Time 13.4  11.6 - 15.2 seconds   INR 1.04  0.00 - 1.49  APTT     Status: None   Collection Time    03/28/13  6:26 AM      Result Value Ref Range   aPTT 27  24 - 37 seconds  I-STAT TROPOININ, ED     Status: None   Collection Time    03/28/13  7:21 AM      Result Value Ref Range   Troponin i, poc 0.00   0.00 - 0.08 ng/mL   Comment 3            Comment: Due to the release kinetics of cTnI,     a negative result within the first hours     of the onset of symptoms does not rule out     myocardial infarction with certainty.     If myocardial infarction is still suspected,     repeat the test at appropriate intervals.    No results found.  Review of Systems  HENT: Positive for nosebleeds.   Neurological: Positive for weakness.  All other systems reviewed and are negative.   Blood pressure 114/60, pulse 68, temperature 96.9 F (36.1 C), temperature source Axillary, resp. rate 13, SpO2 97.00%. Physical Exam  Constitutional: He is oriented to person, place, and time. He appears well-developed and well-nourished. No distress.  HENT:  Head: Normocephalic and atraumatic.  Right Ear: External ear normal.  Left Ear: External ear normal.  Mouth/Throat: Oropharynx is clear and moist.  Bilateral mild hemotympanum.  Bilateral nasal packs in place, no active bleeding.  Eyes: Conjunctivae and EOM are normal. Pupils are equal, round, and reactive to light.  Neck: Normal range of motion. Neck supple.  Cardiovascular: Normal rate.   Respiratory: Effort normal.  Musculoskeletal: Normal range of motion.  Neurological: He is alert and oriented to person, place, and time. No cranial nerve deficit.  Skin: Skin is warm and dry.  Psychiatric: He has a normal mood and affect. His behavior is normal. Judgment and thought content normal.    Assessment/Plan: Right posterior epistaxis, anti-platelet medication With bilateral anterior packs in place, he is probably not adequately packed to prevent re-bleed.  I removed the left anterior pack and there was no bleeding.  We discussed options of leaving  the right anterior pack in place, removing the pack and observing, or replacing the right anterior pack with an anterior/posterior pack.  We agreed to proceed with anterior/posterior packing due to his  anti-platelet medication and risk of rebleeding.  See procedure note.  After pack placement, he was felt stable for discharge.  He will be prescribed clindamycin while the pack is in place.  He was asked to spray the pack with saline spray several times per day.  He was asked to avoid nose blowing or strenuous activity.  Tylenol for pain.  I will discuss a holiday from the anti-platelet medication with his cardiologist, Dr. Radford Pax, and call him back about that.  Follow-up with me on Friday for pack removal.  Wayne Jordan 03/28/2013, 8:24 AM

## 2013-03-28 NOTE — ED Notes (Signed)
Dr. Ranae PalmsYelverton at the bedside.  Patient is holding pressure to nose and leaning forward for 15 min.

## 2013-03-28 NOTE — ED Notes (Signed)
EKG given to Dr. Yelverton 

## 2013-03-28 NOTE — ED Provider Notes (Signed)
CSN: [id number]     Arrival date & time 03/28/13  0608 History   First MD Initiated Contact with Patient 03/28/13 [phone]     Chief Complaint  Patient presents with  . Epistaxis     (Consider location/radiation/quality/duration/timing/severity/associated sxs/prior Treatment) HPI She was driving to work and sneeze. He had spontaneous epistaxis. The bleeding is mostly from the right nare. He's not had previous episodes of epistaxis. He was started on Ticagrelor late last year after a non-STEMI. He complains of mild lightheadedness. EMS was unable to control bleeding with Afrin and pressure. He denies any chest pain at this time. No history of trauma. Past Medical History  Diagnosis Date  . Personal history of urinary calculi   . 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 Acute myocardial infarction of other anterior wall, initial episode of care    Past Surgical History  Procedure Laterality Date  . Inguinal hernia repair Right 03/08/2012    Procedure: HERNIA REPAIR INGUINAL ADULT;  Surgeon: Axel FillerArmando Ramirez, MD;  [facility];  Service: General;  Laterality: Right;  . Insertion of mesh N/A 03/08/2012    Procedure: INSERTION OF MESH;  Surgeon: Axel FillerArmando Ramirez, MD;  Location[facility];  Service: General;  Laterality: N/A;   Family History  Problem Relation Age of Onset  . Heart disease Father    History  Substance Use Topics  . Smoking status: Former Smoker -- 1.50 packs/day for 7 years    Types: Cigarettes    Quit date: 10/01/2012  . Smokeless tobacco: Not on file  . Alcohol Use: Yes    Review of Systems  HENT: Positive for nosebleeds.   Respiratory: Negative for shortness of breath.   Cardiovascular: Negative for chest pain.  Gastrointestinal: Negative for nausea and vomiting.  Skin: Negative for wound.  Neurological: Positive for light-headedness. Negative for weakness and headaches.  All other systems reviewed and are negative.      Allergies  Coconut fatty acids and  Penicillins  Home Medications   Current Outpatient Rx  Name  Route  Sig  Dispense  Refill  . aspirin 81 MG chewable tablet   Oral   Chew 81 mg by mouth daily.         . metoprolol tartrate (LOPRESSOR) 25 MG tablet   Oral   Take 1 tablet (25 mg total) by mouth 2 (two) times daily.   60 tablet   12   . nitroGLYCERIN (NITROSTAT) 0.4 MG SL tablet   Sublingual   Place 1 tablet (0.4 mg total) under the tongue every 5 (five) minutes x 3 doses as needed for chest pain.   30 tablet   12   . simvastatin (ZOCOR) 40 MG tablet   Oral   Take 1 tablet (40 mg total) by mouth daily at 6 PM.   30 tablet   12   . Ticagrelor (BRILINTA) 90 MG TABS tablet   Oral   Take 1 tablet (90 mg total) by mouth 2 (two) times daily.   60 tablet   12    There were no vitals taken for this visit. Physical Exam  Nursing note and vitals reviewed. Constitutional: He is oriented to person, place, and time. He appears well-developed and well-nourished. No distress.  HENT:  Head: Normocephalic and atraumatic.  Mouth/Throat: Oropharynx is clear and moist.  Profuse bleeding from nose. Unable to ascertain which nare is involved.   Eyes: EOM are normal. Pupils are equal, round, and reactive to light.  Neck:  Normal range of motion. Neck supple.  Cardiovascular: Normal rate and regular rhythm.   Pulmonary/Chest: Effort normal and breath sounds normal.  Abdominal: Soft. Bowel sounds are normal.  Musculoskeletal: Normal range of motion. He exhibits no edema and no tenderness.  Neurological: He is alert and oriented to person, place, and time.  Skin: Skin is warm and dry. No rash noted. No erythema.  Psychiatric: He has a normal mood and affect. His behavior is normal.    ED Course  Procedures (including critical care time) Labs Review Labs Reviewed  CBC WITH DIFFERENTIAL  COMPREHENSIVE METABOLIC PANEL  PROTIME-INR  APTT   Imaging Review No results found.   EKG Interpretation None      MDM    Final diagnoses:  None    Patient instructed to maintain pressure of the nose for 15 minutes and will reassess.  Despite holding pressure patient still with epistaxis. States he feels the blood running down the back of his throat. He is becoming diaphoretic and lightheaded. Patient had rapid Rhino rockets placed in each nare. Despite this patient states he still feels the blood trickling down the back of his throat. Will consult with ENT.  Discussed with Dr. Jenne Pane. Will see the patient in emergency department.  Loren Racer, MD 03/30/13 2308

## 2013-03-28 NOTE — ED Notes (Signed)
ENT at bedside

## 2013-04-06 ENCOUNTER — Encounter: Payer: Self-pay | Admitting: General Surgery

## 2013-04-06 DIAGNOSIS — I252 Old myocardial infarction: Secondary | ICD-10-CM | POA: Insufficient documentation

## 2013-04-06 DIAGNOSIS — E782 Mixed hyperlipidemia: Secondary | ICD-10-CM | POA: Insufficient documentation

## 2013-04-06 DIAGNOSIS — I214 Non-ST elevation (NSTEMI) myocardial infarction: Secondary | ICD-10-CM

## 2013-04-06 DIAGNOSIS — Z79899 Other long term (current) drug therapy: Secondary | ICD-10-CM | POA: Insufficient documentation

## 2013-04-06 DIAGNOSIS — I2510 Atherosclerotic heart disease of native coronary artery without angina pectoris: Secondary | ICD-10-CM | POA: Insufficient documentation

## 2013-04-06 HISTORY — DX: Atherosclerotic heart disease of native coronary artery without angina pectoris: I25.10

## 2013-04-06 HISTORY — DX: Other long term (current) drug therapy: Z79.899

## 2013-04-06 HISTORY — DX: Old myocardial infarction: I25.2

## 2013-04-06 HISTORY — DX: Mixed hyperlipidemia: E78.2

## 2013-04-11 ENCOUNTER — Encounter: Payer: Self-pay | Admitting: Cardiology

## 2013-04-11 ENCOUNTER — Ambulatory Visit (INDEPENDENT_AMBULATORY_CARE_PROVIDER_SITE_OTHER): Payer: BC Managed Care – PPO | Admitting: Cardiology

## 2013-04-11 ENCOUNTER — Ambulatory Visit: Payer: BC Managed Care – PPO | Admitting: Cardiology

## 2013-04-11 VITALS — BP 124/89 | HR 82 | Ht 72.0 in | Wt 257.0 lb

## 2013-04-11 DIAGNOSIS — E782 Mixed hyperlipidemia: Secondary | ICD-10-CM

## 2013-04-11 DIAGNOSIS — I2510 Atherosclerotic heart disease of native coronary artery without angina pectoris: Secondary | ICD-10-CM

## 2013-04-11 DIAGNOSIS — E669 Obesity, unspecified: Secondary | ICD-10-CM

## 2013-04-11 DIAGNOSIS — I252 Old myocardial infarction: Secondary | ICD-10-CM

## 2013-04-11 NOTE — Patient Instructions (Signed)
Your physician recommends that you continue on your current medications as directed. Please refer to the Current Medication list given to you today.  Your physician wants you to follow-up in: 6 months with Dr Turner You will receive a reminder letter in the mail two months in advance. If you don't receive a letter, please call our office to schedule the follow-up appointment.  

## 2013-04-11 NOTE — Progress Notes (Signed)
[address] [place], [place]  [address] Phone: [phone] Fax:  [phone]  Date:  04/11/2013   ID:  Wayne Jordan, DOB January 14, 1961, MRN [id number]  PCP:  Sissy Hoff, MD  Cardiologist:  Armanda Magic, MD     History of Present Illness: Wayne Jordan is a 53 y.o. male with a history of ASCAD s/p NSTEMI 09/2012 with cath showing a proximal 80-90% sequential LAD stenosis with luminal irregularities in the LCx and RCA with normal LVF.  He underwent DES to prox LAD and is on ASA and Brilinta.  He is doing well.  He denies any chest pain, SOB, DOE, LE edema, dizziness, palpitations or syncope.   Wt Readings from Last 3 Encounters:  04/11/13 257 lb (116.574 kg)  03/28/13 255 lb (115.667 kg)  10/26/12 253 lb 1.4 oz (114.8 kg)     Past Medical History  Diagnosis Date  . Personal history of urinary calculi   . 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 Hypercholesteremia   . Coronary artery disease     s/P PCI of the LAD with DES    Current Outpatient Prescriptions  Medication Sig Dispense Refill  . ascorbic acid (VITAMIN C) 500 MG tablet Take 500 mg by mouth daily.      Marland Kitchen aspirin 81 MG chewable tablet Chew 81 mg by mouth daily.      . metoprolol tartrate (LOPRESSOR) 25 MG tablet Take 1 tablet (25 mg total) by mouth 2 (two) times daily.  60 tablet  12  . nitroGLYCERIN (NITROSTAT) 0.4 MG SL tablet Place 1 tablet (0.4 mg total) under the tongue every 5 (five) minutes x 3 doses as needed for chest pain.  30 tablet  12  . simvastatin (ZOCOR) 40 MG tablet Take 1 tablet (40 mg total) by mouth daily at 6 PM.  30 tablet  12  . Ticagrelor (BRILINTA) 90 MG TABS tablet Take 1 tablet (90 mg total) by mouth 2 (two) times daily.  60 tablet  12   No current facility-administered medications for this visit.    Allergies:    Allergies  Allergen Reactions  . Coconut Fatty Acids Shortness Of Breath and Swelling    . Penicillins Hives and Rash    Social History:  The patient  reports that he quit smoking about 6 months ago. His smoking use included Cigarettes. He has a 10.5 pack-year smoking history. He does not have any smokeless tobacco history on file. He reports that he drinks alcohol. He reports that he does not use illicit drugs.   Family History:  The patient's family history includes CAD in his father; Heart attack in his father; Heart disease in his father.   ROS:  Please see the history of present illness.      All other systems reviewed and negative.   PHYSICAL EXAM: VS:  BP 124/89  Pulse 82  Ht 6' (1.829 m)  Wt 257 lb (116.574 kg)  BMI 34.85 kg/m2 Well nourished, well developed, in no acute distress HEENT: normal Neck: no JVD Cardiac:  normal S1, S2; RRR; no murmur Lungs:  clear to auscultation bilaterally, no wheezing, rhonchi or rales Abd: soft, nontender, no hepatomegaly Ext: no edema Skin: warm and dry Neuro:  CNs 2-12 intact, no focal abnormalities noted       ASSESSMENT AND PLAN:  1. ASCAD in the setting of NSTEMI s/p DES  to prox LAD on ASA and Brilinta - continue DAPT for 1 year - continue metoprolol 2. Dyslipidemia - at goal - continue simvastatin  Followup with me in 6 months  Signed, Armanda Magicraci Turner, MD 04/11/2013 4:22 PM

## 2013-05-24 ENCOUNTER — Telehealth: Payer: Self-pay | Admitting: *Deleted

## 2013-05-24 NOTE — Telephone Encounter (Signed)
Needs samples of Brilinta 90mg , 30 day supply placed up front.

## 2013-07-09 ENCOUNTER — Telehealth: Payer: Self-pay | Admitting: *Deleted

## 2013-07-09 NOTE — Telephone Encounter (Signed)
Brilinta samples placed at front desk for pick up.

## 2013-07-10 ENCOUNTER — Other Ambulatory Visit: Payer: BC Managed Care – PPO

## 2013-08-09 ENCOUNTER — Encounter: Payer: Self-pay | Admitting: Cardiology

## 2013-08-13 ENCOUNTER — Telehealth: Payer: Self-pay

## 2013-08-13 NOTE — Telephone Encounter (Signed)
Patient called for samples of brilinta gave to him at front desk

## 2013-08-13 NOTE — Telephone Encounter (Signed)
Error

## 2013-09-21 ENCOUNTER — Telehealth: Payer: Self-pay

## 2013-09-21 ENCOUNTER — Other Ambulatory Visit (INDEPENDENT_AMBULATORY_CARE_PROVIDER_SITE_OTHER): Payer: BC Managed Care – PPO

## 2013-09-21 DIAGNOSIS — E7800 Pure hypercholesterolemia, unspecified: Secondary | ICD-10-CM

## 2013-09-21 LAB — HEPATIC FUNCTION PANEL
ALK PHOS: 79 U/L (ref 39–117)
ALT: 39 U/L (ref 0–53)
AST: 29 U/L (ref 0–37)
Albumin: 3.8 g/dL (ref 3.5–5.2)
BILIRUBIN DIRECT: 0 mg/dL (ref 0.0–0.3)
BILIRUBIN TOTAL: 0.6 mg/dL (ref 0.2–1.2)
TOTAL PROTEIN: 7.1 g/dL (ref 6.0–8.3)

## 2013-09-21 NOTE — Telephone Encounter (Signed)
Patient came by the office to pick up samples , gave to [facility] to give to him

## 2013-09-24 LAB — NMR LIPOPROFILE WITH LIPIDS
Cholesterol, Total: 136 mg/dL (ref 100–199)
HDL Particle Number: 28.6 umol/L — ABNORMAL LOW (ref >=30.5)
HDL SIZE: 8.6 nm — AB (ref >=9.2)
HDL-C: 36 mg/dL — AB (ref >39)
LDL (calc): 67 mg/dL (ref 0–99)
LDL Particle Number: 893 nmol/L (ref <1000)
LDL Size: 20.2 nm (ref >=20.8)
LP-IR Score: 76 — ABNORMAL HIGH (ref <=45)
Large HDL-P: 2.3 umol/L — ABNORMAL LOW (ref >=4.8)
Large VLDL-P: 4.9 nmol/L — ABNORMAL HIGH (ref <=2.7)
Small LDL Particle Number: 551 nmol/L — ABNORMAL HIGH (ref <=527)
TRIGLYCERIDES: 165 mg/dL — AB (ref 0–149)
VLDL Size: 50.4 nm — ABNORMAL HIGH (ref <=46.6)

## 2013-09-25 ENCOUNTER — Encounter: Payer: Self-pay | Admitting: General Surgery

## 2013-09-25 ENCOUNTER — Other Ambulatory Visit: Payer: Self-pay | Admitting: General Surgery

## 2013-09-25 DIAGNOSIS — E782 Mixed hyperlipidemia: Secondary | ICD-10-CM

## 2013-09-27 ENCOUNTER — Encounter: Payer: Self-pay | Admitting: Family Medicine

## 2013-09-27 ENCOUNTER — Ambulatory Visit (INDEPENDENT_AMBULATORY_CARE_PROVIDER_SITE_OTHER): Payer: Self-pay | Admitting: Family Medicine

## 2013-09-27 VITALS — BP 123/87 | HR 93 | Temp 97.7°F | Resp 17 | Ht 70.75 in | Wt 264.0 lb

## 2013-09-27 DIAGNOSIS — Z0289 Encounter for other administrative examinations: Secondary | ICD-10-CM

## 2013-09-27 NOTE — Progress Notes (Signed)
Physical exam:  History: 53 year old man here for a DOT physical examination. A year ago he had a heart attack, was treated with stenting by a Dr. Mayford Knife who continues to follow him. He saw her last in March and is due to followup October. He takes his medicines regularly, was cleared to return to work after he finished cardiac rehabilitation in January.a subsequent problems  Past medical history: Operative procedures: Coronary stenting. 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 Married, 5 children which keeps him busy. Does not do a lot of regular exercise though he stays active.  Review of systems: Essentially unremarkable  Physical examination: Overweight male in no acute distress. Apparently maintains about same weight. TMs normal. Eyes PERRLA. Fundi benign. Throat clear. Neck supple without nodes. Chest clear. Heart regular without murmurs. Abdomen soft without mass or tenderness. Small umbilical hernia. Normal male external genitalia. Has a bulge in his right inguinal region below where he had a herniorrhaphy scar. Extremities unremarkable. Pulses good. Skin warm and dry.  Assessment: DVT examination Coronary artery disease with stenting Obesity History of right inguinal herniorrhaphy with residual hernia bulge Umbilical hernia  Plan: Continue current care with his cardiologist. Return in one year for repeat DOT. Advised to bring in a letter of status from his cardiologist when he comes in next year

## 2013-10-10 ENCOUNTER — Telehealth: Payer: Self-pay | Admitting: Cardiology

## 2013-10-10 NOTE — Telephone Encounter (Signed)
TO Dr Mayford Knife.Marland KitchenMarland Kitchen

## 2013-10-10 NOTE — Telephone Encounter (Signed)
New message           Pt is requesting a call from Turner on Tuesday 9/29 after her lunch if she can / pt has questions concerning his health

## 2013-10-10 NOTE — Telephone Encounter (Signed)
Please find out what patients concern is or if he needs to make an appt

## 2013-10-12 ENCOUNTER — Encounter: Payer: Self-pay | Admitting: Cardiology

## 2013-10-12 ENCOUNTER — Ambulatory Visit (INDEPENDENT_AMBULATORY_CARE_PROVIDER_SITE_OTHER): Payer: BC Managed Care – PPO | Admitting: Cardiology

## 2013-10-12 ENCOUNTER — Telehealth: Payer: Self-pay

## 2013-10-12 VITALS — BP 114/78 | HR 76 | Ht 71.0 in | Wt 266.0 lb

## 2013-10-12 DIAGNOSIS — E669 Obesity, unspecified: Secondary | ICD-10-CM

## 2013-10-12 DIAGNOSIS — E782 Mixed hyperlipidemia: Secondary | ICD-10-CM

## 2013-10-12 NOTE — Progress Notes (Signed)
10/12/2013 Wayne Jordan   1960-02-24  [id number]  Primary Physicia Sissy Hoff, MD Primary Cardiologist: Dr Mayford Knife  HPI:  53 y/o male with a history of CAD, s/p NSTEMI Sept 2014 Rx'd with an LAD DES. He had normal LVF at that time and has not had recurrent angina. He did have an episode of epistaxis in March 2015 that required packing. He has not had any bleeding issues since. He works of a [employer] and needed a DOT physical which he had done 09/27/13. 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 He also asked about stopping Brilinta.   Current Outpatient Prescriptions  Medication Sig Dispense Refill  . ascorbic acid (VITAMIN C) 500 MG tablet Take 500 mg by mouth daily.      Marland Kitchen aspirin 81 MG chewable tablet Chew 81 mg by mouth daily.      . metoprolol tartrate (LOPRESSOR) 25 MG tablet Take 1 tablet (25 mg total) by mouth 2 (two) times daily.  60 tablet  12  . nitroGLYCERIN (NITROSTAT) 0.4 MG SL tablet Place 1 tablet (0.4 mg total) under the tongue every 5 (five) minutes x 3 doses as needed for chest pain.  30 tablet  12  . simvastatin (ZOCOR) 40 MG tablet Take 1 tablet (40 mg total) by mouth daily at 6 PM.  30 tablet  12  . Ticagrelor (BRILINTA) 90 MG TABS tablet Take 1 tablet (90 mg total) by mouth 2 (two) times daily.  60 tablet  12   No current facility-administered medications for this visit.    Allergies  Allergen Reactions  . Coconut Fatty Acids Shortness Of Breath and Swelling  . Penicillins Hives and Rash    History   Social History  . Marital Status: Married    Spouse Name: N/A    Number of Children: N/A  . Years of Education: N/A   Occupational History  . Not on file.   Social History Main Topics  . Smoking status: Former Smoker -- 1.50 packs/day for 7 years    Types: Cigarettes    Quit date: 10/01/2012  . Smokeless tobacco: Not on file  . Alcohol Use: Yes  . Drug Use: No  . Sexual Activity: Not on file   Other  Topics Concern  . Not on file   Social History Narrative  . No narrative on file     Review of Systems: General: negative for chills, fever, night sweats or weight changes.  Cardiovascular: negative for chest pain, dyspnea on exertion, edema, orthopnea, palpitations, paroxysmal nocturnal dyspnea or shortness of breath Dermatological: negative for rash Respiratory: negative for cough or wheezing Urologic: negative for hematuria Abdominal: negative for nausea, vomiting, diarrhea, bright red blood per rectum, melena, or hematemesis Neurologic: negative for visual changes, syncope, or dizziness All other systems reviewed and are otherwise negative except as noted above.    Blood pressure 114/78, pulse 76, height  (1.803 m), weight 266 lb (120.657 kg).  General appearance: alert, cooperative, no distress and moderately obese Neck: no carotid bruit and no JVD Lungs: clear to auscultation bilaterally Heart: regular rate and rhythm   ASSESSMENT AND PLAN:   Mixed hyperlipidemia Recent lipid panel is favorable  Obesity, unspecified BMI 37. He says he snores but has no complaints of daytime fatigue or somnolence.  CAD- LAD DES in setting of NSTEMI Sept 2014 No anginal symptoms.    PLAN  The pt has no contraindication to  drive from our standpoint. He has no anginal symptoms. His LVF was normal. He is overweight but does not have sleep apnea by history. I reviewed his lipid panel with him, he will continue current Rx. I discussed Brilinta with him as well. I explained the recommendation is for lifelong Rx. As it has been a year he could stop this if he had to, but I would leave to Dr Mayford Knife to discuss with him.   Wayne Jordan KPA-C 10/12/2013 3:22 PM

## 2013-10-12 NOTE — Assessment & Plan Note (Signed)
BMI 37. He says he snores but has no complaints of daytime fatigue or somnolence.

## 2013-10-12 NOTE — Telephone Encounter (Signed)
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  LEFT AT NURSES STATION.

## 2013-10-12 NOTE — Patient Instructions (Signed)
Your physician recommends that you continue on your current medications as directed. Please refer to the Current Medication list given to you today.  I gave you some samples today of Brilinta until Dr Mayford Knife advises on if you can stop taking the medication.  Your physician wants you to follow-up in: 6 months with Dr Sherlyn Lick will receive a reminder letter in the mail two months in advance. If you don't receive a letter, please call our office to schedule the follow-up appointment.

## 2013-10-12 NOTE — Telephone Encounter (Signed)
Pt is seeing Eda Paschal. This afternoon. I will ask him then.

## 2013-10-12 NOTE — Assessment & Plan Note (Signed)
Recent lipid panel is favorable

## 2013-10-12 NOTE — Assessment & Plan Note (Signed)
No anginal symptoms

## 2013-10-12 NOTE — Telephone Encounter (Signed)
Pt saw Wayne Jordan today in office. He said he wanted to talk to Dr Mayford Knife because he wants to know if he can come off Brilinta? He has been on it for a year now per [facility]. I will give pt some samples today to hold over until you advise the pt. 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 He denies any CP or or cardiac issues. Wayne Jordan will note this in his OV from today for you to review. Also one other thing pt wanted to talk to you about but he didn't want to talk to [facility] or I. Please advise.

## 2013-10-13 NOTE — Telephone Encounter (Signed)
This message should be in [facility] / can you put there? So chart can be pulled?

## 2013-10-15 NOTE — Telephone Encounter (Signed)
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

## 2013-10-16 NOTE — Telephone Encounter (Signed)
Called patient and discussed continuation of DAPT.  He has a history of NSTEMI a year ago with high grade proximal LAD lesion s/p PCI with DES.  Given proximal lesion in LAD I have recommended that he remain of DAPT with ASA and plavix or Brilinta for now.  He is tolerating well.  I offered to switch him to generic plavix but for now he will continue to get samples of Brilinta from our office.  He also had a question about a [facility] that was not covered on by his insurance and I recommended that he [facility] the [facility] and discuss it with them.

## 2013-10-16 NOTE — Telephone Encounter (Signed)
I have talked with Mr. Wayne Jordan.  He has not had any chest pain or SOB since his MI a year ago.  He has no symptoms of sleep apnea.  He denies any daytime sleepiness or sleepiness with driving.  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  Please send this to patient for his DOT physical to email [email or web address]

## 2013-10-17 ENCOUNTER — Encounter: Payer: Self-pay | Admitting: General Surgery

## 2013-10-17 NOTE — Telephone Encounter (Signed)
Wrote up letter. [facility]. I will call pt and let him know the letter is up front for him to pick up at his convenience.

## 2013-10-17 NOTE — Telephone Encounter (Signed)
Pt requested I fax over to his office. I sent to [phone]

## 2013-10-18 ENCOUNTER — Other Ambulatory Visit: Payer: Self-pay | Admitting: *Deleted

## 2013-10-18 MED ORDER — SIMVASTATIN 40 MG PO TABS: 40.0000 mg | ORAL_TABLET | Freq: Every day | ORAL | Status: DC

## 2013-10-19 ENCOUNTER — Telehealth: Payer: Self-pay | Admitting: Cardiology

## 2013-10-19 ENCOUNTER — Telehealth: Payer: Self-pay

## 2013-10-19 NOTE — Telephone Encounter (Signed)
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 IN epic under letters dated 10/17/13. Please advise. Thank you

## 2013-10-19 NOTE — Telephone Encounter (Signed)
Pt did not get faxed letter. He is here in office. I reprinted letter and gave him a copy. Refill department is getting Brilinta samples for him

## 2013-10-19 NOTE — Telephone Encounter (Signed)
New message     Patient calling checking on status of letter to be pick up.     Was told by Dr, Mayford Knifeurner not to stop medication . However, is out refill, - route to refill dept.

## 2013-10-23 ENCOUNTER — Ambulatory Visit: Payer: BC Managed Care – PPO | Admitting: Cardiology

## 2013-10-26 ENCOUNTER — Other Ambulatory Visit: Payer: Self-pay | Admitting: *Deleted

## 2013-10-26 MED ORDER — METOPROLOL TARTRATE 25 MG PO TABS: 25.0000 mg | ORAL_TABLET | Freq: Two times a day (BID) | ORAL | Status: DC

## 2013-10-28 NOTE — Telephone Encounter (Signed)
Pt.notified

## 2013-10-28 NOTE — Telephone Encounter (Signed)
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  For his exam next year he will need to have results of his Echo from after his MI and a exercise stress test.

## 2013-11-02 ENCOUNTER — Ambulatory Visit: Payer: BC Managed Care – PPO | Admitting: Cardiology

## 2013-12-25 ENCOUNTER — Telehealth: Payer: Self-pay

## 2013-12-25 NOTE — Telephone Encounter (Signed)
Patient called to get samples of brilinta placed a month supply up front

## 2013-12-27 ENCOUNTER — Encounter (HOSPITAL_COMMUNITY): Payer: Self-pay | Admitting: Interventional Cardiology

## 2014-01-25 ENCOUNTER — Telehealth: Payer: Self-pay | Admitting: *Deleted

## 2014-01-25 NOTE — Telephone Encounter (Signed)
Samples of Brilinta 90mg  at front desk for patient to pick up.

## 2014-01-28 ENCOUNTER — Encounter: Payer: Self-pay | Admitting: Cardiology

## 2014-02-14 ENCOUNTER — Other Ambulatory Visit: Payer: Self-pay

## 2014-02-14 NOTE — Telephone Encounter (Signed)
Patient came to office to pick up samples of brilintia gave to him at the front desk

## 2014-02-18 ENCOUNTER — Telehealth: Payer: Self-pay

## 2014-02-18 NOTE — Telephone Encounter (Signed)
Late entry patient called on 1.29.16 for samples of brilinta place samples up front

## 2014-02-26 ENCOUNTER — Encounter: Payer: Self-pay | Admitting: Cardiology

## 2014-03-13 ENCOUNTER — Telehealth: Payer: Self-pay | Admitting: *Deleted

## 2014-03-13 NOTE — Telephone Encounter (Signed)
Brilinta samples placed at the front desk for patient. 

## 2014-03-26 ENCOUNTER — Other Ambulatory Visit: Payer: BC Managed Care – PPO

## 2014-04-04 ENCOUNTER — Telehealth: Payer: Self-pay

## 2014-04-04 NOTE — Telephone Encounter (Signed)
Patient called for samples of brilinta placed samples up front 

## 2014-04-26 ENCOUNTER — Other Ambulatory Visit (INDEPENDENT_AMBULATORY_CARE_PROVIDER_SITE_OTHER): Payer: 59 | Admitting: *Deleted

## 2014-04-26 DIAGNOSIS — E782 Mixed hyperlipidemia: Secondary | ICD-10-CM

## 2014-04-26 LAB — HEPATIC FUNCTION PANEL
ALBUMIN: 3.9 g/dL (ref 3.5–5.2)
ALT: 35 U/L (ref 0–53)
AST: 26 U/L (ref 0–37)
Alkaline Phosphatase: 88 U/L (ref 39–117)
BILIRUBIN TOTAL: 0.5 mg/dL (ref 0.2–1.2)
Bilirubin, Direct: 0.1 mg/dL (ref 0.0–0.3)
Total Protein: 7 g/dL (ref 6.0–8.3)

## 2014-04-26 NOTE — Addendum Note (Signed)
Addended by: Tonita PhoenixBOWDEN, Laurel Harnden K on: 04/26/2014 07:56 AM   Modules accepted: Orders

## 2014-04-29 ENCOUNTER — Other Ambulatory Visit: Payer: Self-pay | Admitting: *Deleted

## 2014-04-29 MED ORDER — SIMVASTATIN 40 MG PO TABS: 40.0000 mg | ORAL_TABLET | Freq: Every day | ORAL | Status: DC

## 2014-04-30 LAB — NMR LIPOPROFILE WITH LIPIDS
Cholesterol, Total: 128 mg/dL (ref 100–199)
HDL Particle Number: 29.9 umol/L — ABNORMAL LOW (ref >=30.5)
HDL Size: 8.7 nm — ABNORMAL LOW (ref >=9.2)
HDL-C: 39 mg/dL — ABNORMAL LOW (ref >39)
LARGE HDL: 2.8 umol/L — AB (ref >=4.8)
LDL CALC: 61 mg/dL (ref 0–99)
LDL Particle Number: 776 nmol/L (ref <1000)
LDL Size: 20.4 nm (ref >=20.8)
LP-IR SCORE: 73 — AB (ref <=45)
Large VLDL-P: 4.5 nmol/L — ABNORMAL HIGH (ref <=2.7)
SMALL LDL PARTICLE NUMBER: 399 nmol/L (ref <=527)
Triglycerides: 140 mg/dL (ref 0–149)
VLDL Size: 52.6 nm — ABNORMAL HIGH (ref <=46.6)

## 2014-05-02 ENCOUNTER — Ambulatory Visit: Payer: Self-pay | Admitting: Cardiology

## 2014-05-03 ENCOUNTER — Ambulatory Visit (INDEPENDENT_AMBULATORY_CARE_PROVIDER_SITE_OTHER): Payer: 59 | Admitting: Cardiology

## 2014-05-03 ENCOUNTER — Encounter: Payer: Self-pay | Admitting: Cardiology

## 2014-05-03 DIAGNOSIS — E782 Mixed hyperlipidemia: Secondary | ICD-10-CM

## 2014-05-03 DIAGNOSIS — I2510 Atherosclerotic heart disease of native coronary artery without angina pectoris: Secondary | ICD-10-CM

## 2014-05-03 DIAGNOSIS — I214 Non-ST elevation (NSTEMI) myocardial infarction: Secondary | ICD-10-CM | POA: Diagnosis not present

## 2014-05-03 DIAGNOSIS — R0609 Other forms of dyspnea: Secondary | ICD-10-CM

## 2014-05-03 MED ORDER — CLOPIDOGREL BISULFATE 75 MG PO TABS: 75.0000 mg | ORAL_TABLET | Freq: Every day | ORAL | Status: DC

## 2014-05-03 NOTE — Addendum Note (Signed)
Addended by: Armanda MagicURNER, TRACI R on: 05/03/2014 11:07 PM   Modules accepted: Kipp BroodSmartSet

## 2014-05-03 NOTE — Addendum Note (Signed)
Addended by: Armanda MagicURNER, Ryheem Jay R on: 05/03/2014 11:08 PM   Modules accepted: Kipp BroodSmartSet

## 2014-05-03 NOTE — Progress Notes (Addendum)
Cardiology Office Note   Date:  05/03/2014   ID:  Wayne Jordan, DOB [birth year], MRN [id number]  PCP:  Sissy Hoff, MD    Chief Complaint  Patient presents with  . Coronary Artery Disease  . Hyperlipidemia  . Shortness of Breath      History of Present Illness: Wayne Jordan is a 54 y.o. male with a history of ASCAD s/p NSTEMI 09/2012 with cath showing a proximal 80-90% sequential LAD stenosis with luminal irregularities in the LCx and RCA with normal LVF. He underwent DES to prox LAD and is on ASA and Brilinta. He is doing well. He denies any chest pain, LE edema, dizziness, palpitations or syncope. He is complaining that he is getting SOB when bending over to tie his shoes, playing volleyball and getting in and out of his truck.  He is concerned that he had the same symptoms when he presented with his MI.  He has not had any anginal chest pain.      Past Medical History  Diagnosis Date  . Personal history of urinary calculi   . 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 Hypercholesteremia   . Coronary artery disease     s/P PCI of the LAD with DES  . Hyperlipidemia     Past Surgical History  Procedure Laterality Date  . Inguinal hernia repair Right 03/08/2012    Procedure: HERNIA REPAIR INGUINAL ADULT;  Surgeon: Axel Filler, MD;  Location: [facility] OR;  Service: General;  Laterality: Right;  . Insertion of mesh N/A 03/08/2012    Procedure: INSERTION OF MESH;  Surgeon: Axel Filler, MD;  Location: [facility] OR;  Service: General;  Laterality: N/A;  . Left heart catheterization with coronary angiogram Right 10/07/2012    Procedure: LEFT HEART CATHETERIZATION WITH CORONARY ANGIOGRAM;  Surgeon: Corky Crafts, MD;  Location: [name] CATH LAB;  Service: Cardiovascular;  Laterality: Right;  . Percutaneous stent intervention  10/07/2012    Procedure: PERCUTANEOUS STENT INTERVENTION;  Surgeon: Corky Crafts, MD;  Location: [facility] CATH LAB;  Service: Cardiovascular;;     Current Outpatient Prescriptions  Medication Sig Dispense Refill  . ascorbic acid (VITAMIN C) 500 MG tablet Take 500 mg by mouth daily.    Marland Kitchen aspirin 81 MG chewable tablet Chew 81 mg by mouth daily.    . metoprolol tartrate (LOPRESSOR) 25 MG tablet Take 1 tablet (25 mg total) by mouth 2 (two) times daily. 60 tablet 11  . nitroGLYCERIN (NITROSTAT) 0.4 MG SL tablet Place 1 tablet (0.4 mg total) under the tongue every 5 (five) minutes x 3 doses as needed for chest pain. 30 tablet 12  . simvastatin (ZOCOR) 40 MG tablet Take 1 tablet (40 mg total) by mouth daily at 6 PM. 30 tablet 5  . clopidogrel (PLAVIX) 75 MG tablet Take 1 tablet (75 mg total) by mouth daily. 30 tablet 6   No current facility-administered medications for this visit.    Allergies:   Coconut fatty acids and Penicillins    Social History:  The patient  reports that he quit smoking about 19 months ago. His smoking use included Cigarettes. He has a 10.5 pack-year smoking history. He does not have any smokeless tobacco history on file. He reports that he drinks alcohol. He reports that he does not use illicit drugs.   Family History:  The patient's family history includes CAD in his father;  Heart attack in his father; Heart disease in his father.    ROS:  Please see the history of present illness.   Otherwise, review of systems are positive for none.   All other systems are reviewed and negative.    PHYSICAL EXAM: VS:  BP 112/80 mmHg  Pulse 85  Ht 5\' 11"  (1.803 m)  Wt 272 lb 9.6 oz (123.651 kg)  BMI 38.04 kg/m2  SpO2 100% , BMI Body mass index is 38.04 kg/(m^2). GEN: Well nourished, well developed, in no acute distress HEENT: normal Neck: no JVD, carotid bruits, or masses Cardiac: RRR; no murmurs, rubs, or gallops,no edema  Respiratory:  clear to auscultation bilaterally, normal work of breathing GI: soft, nontender, nondistended, + BS MS: no  deformity or atrophy Skin: warm and dry, no rash Neuro:  Strength and sensation are intact Psych: euthymic mood, full affect   EKG:  EKG was ordered today and showed NSR with no ST changes    Recent Labs: 04/26/2014: ALT 35    Lipid Panel    Component Value Date/Time   CHOL 128 04/26/2014 0757   CHOL 138 12/08/2012 0945   TRIG 140 04/26/2014 0757   TRIG 142.0 12/08/2012 0945   HDL 39* 04/26/2014 0757   HDL 38.80* 12/08/2012 0945   CHOLHDL 4 12/08/2012 0945   VLDL 28.4 12/08/2012 0945   LDLCALC 61 04/26/2014 0757   LDLCALC 71 12/08/2012 0945      Wt Readings from Last 3 Encounters:  05/03/14 272 lb 9.6 oz (123.651 kg)  10/12/13 266 lb (120.657 kg)  09/27/13 264 lb (119.75 kg)    ASSESSMENT AND PLAN:  1.  ASCAD in the setting of NSTEMI s/p DES to prox LAD on ASA and Brilinta.  He has been having some DOE which could be related to the Brilinta but also could be due to coronary ischemia. It also could be due to weight gain.  He has gained 6 lbs since Sept 2015.   - continue ASA - continue metoprolol - change Brilinta to Plavix - check Stress myoview to rule out ischemia 2. Dyslipidemia - at goal - continue simvastatin   3.  DOE - see above.  I have encouraged him to get into a routine exercise program if stress test is normal.       Current medicines are reviewed at length with the patient today.  The patient does not have concerns regarding medicines.  The following changes have been made:  no change  Labs/ tests ordered today include: see above assessment and plan  Orders Placed This Encounter  Procedures  . Myocardial Perfusion Imaging  . EKG 12-Lead     Disposition:   FU with me in 6 months   Signed, Quintella ReichertURNER,Clearnce Leja R, MD  05/03/2014 11:07 PM    [facility] [address], [place], [place]  [address] Phone: [phone]; Fax: [phone]

## 2014-05-03 NOTE — Patient Instructions (Addendum)
Medication Instructions:  Your physician has recommended you make the following change in your medication: 1) STOP BRILINTA 2) START PLAVIX 75 mg daily  Labwork: None   Testing/Procedures: Dr. Mayford Knifeurner recommends you have a NUCLEAR STRESS TEST.  Follow-Up: Your physician wants you to follow-up in: 6 months with Dr. Mayford Knifeurner. You will receive a reminder letter in the mail two months in advance. If you don't receive a letter, please call our office to schedule the follow-up appointment.

## 2014-05-10 ENCOUNTER — Ambulatory Visit (HOSPITAL_COMMUNITY): Payer: 59 | Attending: Cardiology | Admitting: Radiology

## 2014-05-10 DIAGNOSIS — R079 Chest pain, unspecified: Secondary | ICD-10-CM | POA: Insufficient documentation

## 2014-05-10 DIAGNOSIS — R0609 Other forms of dyspnea: Secondary | ICD-10-CM | POA: Insufficient documentation

## 2014-05-10 DIAGNOSIS — Z8249 Family history of ischemic heart disease and other diseases of the circulatory system: Secondary | ICD-10-CM | POA: Insufficient documentation

## 2014-05-10 MED ORDER — TECHNETIUM TC 99M SESTAMIBI GENERIC - CARDIOLITE
11.0000 | Freq: Once | INTRAVENOUS | Status: AC | PRN
  Administered 2014-05-10: 11 via INTRAVENOUS

## 2014-05-10 MED ORDER — TECHNETIUM TC 99M SESTAMIBI GENERIC - CARDIOLITE
33.0000 | Freq: Once | INTRAVENOUS | Status: AC | PRN
  Administered 2014-05-10: 33 via INTRAVENOUS

## 2014-05-10 NOTE — Progress Notes (Signed)
[facility] SITE 3 NUCLEAR MED [address] [place], [place] [address] [id number]    Cardiology Nuclear Med Study  Wayne Jordan is a 54 y.o. male     MRN : [id number]     DOB: 01/25/1960  Procedure Date: 05/10/2014  Nuclear Med Background Indication for Stress Test:  Evaluation for Ischemia and Follow-up CAD History:  CAD; Cardiac Risk Factors: Family History - CAD  Symptoms:  Chest Pain and DOE   Nuclear Pre-Procedure Caffeine/Decaff Intake:  7:30pm NPO After: 7:30pm   Lungs:  clear O2 Sat: 95% on room air. IV 0.9% NS with Angio Cath:  22g  IV Site: R Hand  IV Started by:  Frederick Peerseresa Johnson, EMT-P  Chest Size (in):  52 Cup Size: n/a  Height: 5\' 11"  (1.803 m)  Weight:  271 lb (122.925 kg)  BMI:  Body mass index is 37.81 kg/(m^2). Tech Comments: n/a    Nuclear Med Study 1 or 2 day study: 1 day  Stress Test Type:  Stress  Reading MD: n/a  Order Authorizing Provider:  T.Turner, MD  Resting Radionuclide: Technetium 5148m Sestamibi  Resting Radionuclide Dose: 11.0 mCi   Stress Radionuclide:  Technetium 6048m Sestamibi  Stress Radionuclide Dose: 33.0 mCi           Stress Protocol Rest HR: 64 Stress HR: 146  Rest BP: 157/88 Stress BP: 196/91  Exercise Time (min): 6:45 METS: 7.0   Predicted Max HR: 165 bpm % Max HR: 88.48 bpm Rate Pressure Product: [id number]   Dose of Adenosine (mg):  n/a Dose of Lexiscan: n/a mg  Dose of Atropine (mg): n/a Dose of Dobutamine: n/a mcg/kg/min (at max HR)  Stress Test Technologist: Nelson ChimesSharon Brooks, BS-ES  Nuclear Technologist:  Kerby NoraElzbieta Kubak, CNMT     Rest Procedure:  Myocardial perfusion imaging was performed at rest 45 minutes following the intravenous administration of Technetium 6548m Sestamibi. Rest ECG: NSR - Normal EKG  Stress Procedure:  The patient exercised on the treadmill utilizing the [name] Protocol for 6:45 minutes. The patient stopped due to knee pain, SOB and denied any chest pain.  Technetium 6448m Sestamibi was injected  at peak exercise and myocardial perfusion imaging was performed after a brief delay. Stress ECG: No significant change from baseline ECG  QPS Raw Data Images:  Normal; no motion artifact; normal heart/lung ratio. Stress Images:  Normal homogeneous uptake in all areas of the myocardium. Rest Images:  Normal homogeneous uptake in all areas of the myocardium. Subtraction (SDS):  No evidence of ischemia. Transient Ischemic Dilatation (Normal <1.22):  0.95 Lung/Heart Ratio (Normal <0.45):  0.35  Quantitative Gated Spect Images QGS EDV:  94 ml QGS ESV:  37 ml  Impression Exercise Capacity:  Good exercise capacity. BP Response:  Normal blood pressure response. Clinical Symptoms:  No chest pain. ECG Impression:  No significant ST segment change suggestive of ischemia. Comparison with Prior Nuclear Study: No previous nuclear study performed  Overall Impression:  Normal stress nuclear study.  LV Ejection Fraction: 61%.  LV Wall Motion:  NL LV Function; NL Wall Motion   Cassell Clementhomas Avagail Whittlesey MD

## 2014-08-22 ENCOUNTER — Encounter: Payer: Self-pay | Admitting: Cardiology

## 2014-09-26 ENCOUNTER — Other Ambulatory Visit: Payer: BC Managed Care – PPO

## 2014-10-16 ENCOUNTER — Other Ambulatory Visit: Payer: Self-pay | Admitting: *Deleted

## 2014-10-16 MED ORDER — CLOPIDOGREL BISULFATE 75 MG PO TABS: 75.0000 mg | ORAL_TABLET | Freq: Every day | ORAL | Status: DC

## 2014-11-04 ENCOUNTER — Other Ambulatory Visit: Payer: Self-pay | Admitting: Cardiology

## 2014-11-04 MED ORDER — METOPROLOL TARTRATE 25 MG PO TABS: 25.0000 mg | ORAL_TABLET | Freq: Two times a day (BID) | ORAL | Status: DC

## 2014-11-05 ENCOUNTER — Other Ambulatory Visit: Payer: Self-pay | Admitting: *Deleted

## 2014-11-05 MED ORDER — SIMVASTATIN 40 MG PO TABS: 40.0000 mg | ORAL_TABLET | Freq: Every day | ORAL | Status: DC

## 2014-11-05 MED ORDER — METOPROLOL TARTRATE 25 MG PO TABS: 25.0000 mg | ORAL_TABLET | Freq: Two times a day (BID) | ORAL | Status: DC

## 2014-11-05 NOTE — Telephone Encounter (Signed)
Fax received from [facility] for refill on metoprolol and simvastatin. Rx's sent.

## 2014-11-06 NOTE — Progress Notes (Signed)
Cardiology Office Note   Date:  11/07/2014   ID:  Wayne Jordan, DOB 01/19/1960, MRN [id number]  PCP:  Sissy HoffSWAYNE,[name] W, MD    Chief Complaint  Patient presents with  . Coronary Artery Disease  . Hyperlipidemia      History of Present Illness: Wayne Jordan is a 54 y.o. male with a history of ASCAD s/p NSTEMI 09/2012 with cath showing a proximal 80-90% sequential LAD stenosis with luminal irregularities in the LCx and RCA with normal LVF. He underwent DES to prox LAD and was initially  on ASA and Brilinta. He is doing well. He denies any chest pain, LE edema, dizziness, palpitations or syncope. On last OV he was complaining that he was getting SOB when bending over to tie his shoes, playing volleyball and getting in and out of his truck.Nuclear stress test showed no ischemia and his Brilinta was changed to Plavix with resolution of SOB.      Past Medical History  Diagnosis Date  . Personal history of urinary calculi   . 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 Hypercholesteremia   . Coronary artery disease     s/P PCI of the LAD with DES  . Hyperlipidemia     Past Surgical History  Procedure Laterality Date  . Inguinal hernia repair Right 03/08/2012    Procedure: HERNIA REPAIR INGUINAL ADULT;  Surgeon: Axel FillerArmando Ramirez, MD;  [facility];  Service: General;  Laterality: Right;  . Insertion of mesh N/A 03/08/2012    Procedure: INSERTION OF MESH;  Surgeon: Axel FillerArmando Ramirez, MD;  [facility];  Service: General;  Laterality: N/A;  . Left heart catheterization with coronary angiogram Right 10/07/2012    Procedure: LEFT HEART CATHETERIZATION WITH CORONARY ANGIOGRAM;  Surgeon: Corky CraftsJayadeep S Varanasi, MD;  [facility];  Service: Cardiovascular;  Laterality: Right;  . Percutaneous stent intervention  10/07/2012    Procedure: PERCUTANEOUS STENT INTERVENTION;  Surgeon: Corky CraftsJayadeep S  Varanasi, MD;  [facility];  Service: Cardiovascular;;     Current Outpatient Prescriptions  Medication Sig Dispense Refill  . ascorbic acid (VITAMIN C) 500 MG tablet Take 500 mg by mouth daily.    Marland Kitchen. aspirin 81 MG chewable tablet Chew 81 mg by mouth daily.    . clopidogrel (PLAVIX) 75 MG tablet Take 1 tablet (75 mg total) by mouth daily. 90 tablet 0  . metoprolol tartrate (LOPRESSOR) 25 MG tablet Take 1 tablet (25 mg total) by mouth 2 (two) times daily. 180 tablet 0  . nitroGLYCERIN (NITROSTAT) 0.4 MG SL tablet Place 1 tablet (0.4 mg total) under the tongue every 5 (five) minutes x 3 doses as needed for chest pain. 30 tablet 12  . simvastatin (ZOCOR) 40 MG tablet Take 1 tablet (40 mg total) by mouth daily at 6 PM. 90 tablet 0   No current facility-administered medications for this visit.    Allergies:   Coconut fatty acids and Penicillins    Social History:  The patient  reports that he quit smoking about 2 years ago. His smoking use included Cigarettes. He has a 10.5 pack-year smoking history. He does not have any smokeless tobacco history on file. He reports that he drinks alcohol. He reports that he does not use illicit drugs.   Family History:  The patient's family history  includes CAD in his father; Heart attack in his father; Heart disease in his father.    ROS:  Please see the history of present illness.   Otherwise, review of systems are positive for none.   All other systems are reviewed and negative.    PHYSICAL EXAM: VS:  BP 110/68 mmHg  Pulse 69  Ht  (1.803 m)  Wt 269 lb 3.2 oz (122.108 kg)  BMI 37.56 kg/m2  SpO2 95% , BMI Body mass index is 37.56 kg/(m^2). GEN: Well nourished, well developed, in no acute distress HEENT: normal Neck: no JVD, carotid bruits, or masses Cardiac: RRR; no murmurs, rubs, or gallops,no edema  Respiratory:  clear to auscultation bilaterally, normal work of breathing GI: soft, nontender, nondistended, + BS MS: no deformity  or atrophy Skin: warm and dry, no rash Neuro:  Strength and sensation are intact Psych: euthymic mood, full affect   EKG:  EKG is not ordered today.    Recent Labs: 04/26/2014: ALT 35    Lipid Panel    Component Value Date/Time   CHOL 128 04/26/2014 0757   CHOL 138 12/08/2012 0945   TRIG 140 04/26/2014 0757   TRIG 142.0 12/08/2012 0945   HDL 39* 04/26/2014 0757   HDL 38.80* 12/08/2012 0945   CHOLHDL 4 12/08/2012 0945   VLDL 28.4 12/08/2012 0945   LDLCALC 61 04/26/2014 0757   LDLCALC 71 12/08/2012 0945      Wt Readings from Last 3 Encounters:  11/07/14 269 lb 3.2 oz (122.108 kg)  05/10/14 271 lb (122.925 kg)  05/03/14 272 lb 9.6 oz (123.651 kg)    ASSESSMENT AND PLAN:  1. ASCAD in the setting of NSTEMI s/p DES to prox LAD on ASA and Plavix.   - continue ASA, BB, statin, Plavix 2.  Dyslipidemia - at goal - continue simvastatin - recheck FLP and ALT - I have encouraged him to get into a routine exercise program    Current medicines are reviewed at length with the patient today.  The patient does not have concerns regarding medicines.  The following changes have been made:  no change  Labs/ tests ordered today: See above Assessment and Plan No orders of the defined types were placed in this encounter.     Disposition:   FU with me in 1 year  Signed, Quintella Reichert, MD  11/07/2014 9:01 AM    [facility] [address] [place], [place], [place]  [address] Phone: [phone]; Fax: [phone]

## 2014-11-07 ENCOUNTER — Ambulatory Visit (INDEPENDENT_AMBULATORY_CARE_PROVIDER_SITE_OTHER): Payer: 59 | Admitting: Cardiology

## 2014-11-07 ENCOUNTER — Other Ambulatory Visit: Payer: Self-pay | Admitting: Cardiology

## 2014-11-07 ENCOUNTER — Encounter: Payer: Self-pay | Admitting: Cardiology

## 2014-11-07 VITALS — BP 110/68 | HR 69 | Ht 71.0 in | Wt 269.2 lb

## 2014-11-07 DIAGNOSIS — I2510 Atherosclerotic heart disease of native coronary artery without angina pectoris: Secondary | ICD-10-CM

## 2014-11-07 DIAGNOSIS — E782 Mixed hyperlipidemia: Secondary | ICD-10-CM

## 2014-11-07 DIAGNOSIS — I252 Old myocardial infarction: Secondary | ICD-10-CM

## 2014-11-07 LAB — HEPATIC FUNCTION PANEL
ALBUMIN: 4.2 g/dL (ref 3.6–5.1)
ALT: 36 U/L (ref 9–46)
AST: 27 U/L (ref 10–35)
Alkaline Phosphatase: 84 U/L (ref 40–115)
Bilirubin, Direct: 0.1 mg/dL (ref <=0.2)
Indirect Bilirubin: 0.5 mg/dL (ref 0.2–1.2)
Total Bilirubin: 0.6 mg/dL (ref 0.2–1.2)
Total Protein: 7.1 g/dL (ref 6.1–8.1)

## 2014-11-07 MED ORDER — METOPROLOL TARTRATE 25 MG PO TABS: 25.0000 mg | ORAL_TABLET | Freq: Two times a day (BID) | ORAL | Status: DC

## 2014-11-07 NOTE — Patient Instructions (Signed)
Medication Instructions:  Your physician recommends that you continue on your current medications as directed. Please refer to the Current Medication list given to you today.   Labwork: Your physician recommends that you return for FASTING lab work.  Testing/Procedures: None  Follow-Up: Your physician wants you to follow-up in: 1 year with Dr. Turner. You will receive a reminder letter in the mail two months in advance. If you don't receive a letter, please call our office to schedule the follow-up appointment.   Any Other Special Instructions Will Be Listed Below (If Applicable).   

## 2014-11-08 LAB — CARDIO IQ(R) ADVANCED LIPID PANEL

## 2014-11-13 ENCOUNTER — Encounter: Payer: Self-pay | Admitting: Cardiology

## 2014-11-13 LAB — CARDIO IQ(R) ADVANCED LIPID PANEL

## 2014-11-19 ENCOUNTER — Telehealth: Payer: Self-pay

## 2014-11-19 DIAGNOSIS — E782 Mixed hyperlipidemia: Secondary | ICD-10-CM

## 2014-11-19 MED ORDER — ATORVASTATIN CALCIUM 80 MG PO TABS: 80.0000 mg | ORAL_TABLET | Freq: Every day | ORAL | Status: DC

## 2014-11-19 NOTE — Telephone Encounter (Signed)
-----   Message from Quintella Reichertraci R Turner, MD sent at 11/14/2014  8:07 AM EDT ----- LDL not at goal - stop simvastatin and start Lipitor 80mg  daily and recheck FLP and ALT in 6 weeks

## 2014-11-19 NOTE — Telephone Encounter (Signed)
Patient st he just received a 90 day supply of Simvastatin. Per Aundra MilletMegan, [facility], instructed patient to finish this month with Zocor. Instructed patient to START LIPITOR 80 mg December 1. Fasting labs scheduled for February 19, 2015. Patient agrees with treatment plan.

## 2014-12-09 ENCOUNTER — Other Ambulatory Visit: Payer: Self-pay | Admitting: Cardiology

## 2014-12-29 ENCOUNTER — Other Ambulatory Visit: Payer: Self-pay | Admitting: Cardiology

## 2015-01-31 ENCOUNTER — Ambulatory Visit (INDEPENDENT_AMBULATORY_CARE_PROVIDER_SITE_OTHER): Payer: Self-pay | Admitting: Family Medicine

## 2015-01-31 VITALS — BP 120/80 | HR 95 | Temp 98.0°F | Resp 20 | Ht 71.0 in | Wt 259.0 lb

## 2015-01-31 DIAGNOSIS — R0683 Snoring: Secondary | ICD-10-CM

## 2015-01-31 DIAGNOSIS — Z024 Encounter for examination for driving license: Secondary | ICD-10-CM

## 2015-01-31 DIAGNOSIS — Z021 Encounter for pre-employment examination: Secondary | ICD-10-CM

## 2015-01-31 NOTE — Progress Notes (Signed)
Subjective:    Patient ID: Wayne Jordan, male    DOB: [birth year], 55 y.o.   MRN: [id number]  HPI   Wayne Jordan is a 55 y.o. male  DOt physical  Hx of CAD, s/p stent in 09/2012 for NSTEMI. OV with cards 10/16, stable.  Stress test 05/10/14 - EF 61%, normal stress echo. No chest pains, DOE or lightheadedness.   No glasses/corrective lenses to drive. No hx of glaucoma or eye disorders, no difficulty with glare at night.   Snores at night - possible pauses in breathing noticed by spouse.  No recent changes. No daytime somnolence, no usual daytime naps.   No weakness or MSK problems. Hx R inguinal hernia repair. Has umbilical hernia for some time - denide=ies pain or symptoms with   Patient Active Problem List   Diagnosis Date Noted  . CAD- LAD DES in setting of NSTEMI Sept 2014 04/06/2013  . Mixed hyperlipidemia 04/06/2013  . Old MI (myocardial infarction) 04/06/2013  . Encounter for long-term (current) use of other medications 04/06/2013  . Obesity, unspecified 10/08/2012   Past Medical History  Diagnosis Date  . Personal history of urinary calculi   . 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 Hypercholesteremia   . Coronary artery disease     s/P PCI of the LAD with DES  . Hyperlipidemia    Past Surgical History  Procedure Laterality Date  . Inguinal hernia repair Right 03/08/2012    Procedure: HERNIA REPAIR INGUINAL ADULT;  Surgeon: Axel Filler, MD;  Location: [facility] OR;  Service: General;  Laterality: Right;  . Insertion of mesh N/A 03/08/2012    Procedure: INSERTION OF MESH;  Surgeon: Axel Filler, MD;  Location: [facility] OR;  Service: General;  Laterality: N/A;  . Left heart catheterization with coronary angiogram Right 10/07/2012    Procedure: LEFT HEART CATHETERIZATION WITH CORONARY ANGIOGRAM;  Surgeon: Corky Crafts, MD;  Location: [facility] CATH LAB;  Service: Cardiovascular;  Laterality: Right;  .  Percutaneous stent intervention  10/07/2012    Procedure: PERCUTANEOUS STENT INTERVENTION;  Surgeon: Corky Crafts, MD;  Location: [facility] CATH LAB;  Service: Cardiovascular;;   Allergies  Allergen Reactions  . Coconut Fatty Acids Shortness Of Breath and Swelling  . Penicillins Hives and Rash   Prior to Admission medications   Medication Sig Start Date End Date Taking? Authorizing Provider  ascorbic acid (VITAMIN C) 500 MG tablet Take 500 mg by mouth daily.   Yes Historical Provider, MD  aspirin 81 MG chewable tablet Chew 81 mg by mouth daily.   Yes Historical Provider, MD  atorvastatin (LIPITOR) 80 MG tablet Take 1 tablet (80 mg total) by mouth daily. 11/19/14  Yes Quintella Reichert, MD  clopidogrel (PLAVIX) 75 MG tablet Take 1 tablet by mouth  daily 12/09/14  Yes Quintella Reichert, MD  metoprolol tartrate (LOPRESSOR) 25 MG tablet Take 1 tablet (25 mg total) by mouth 2 (two) times daily. 11/07/14  Yes Quintella Reichert, MD  nitroGLYCERIN (NITROSTAT) 0.4 MG SL tablet Place 1 tablet (0.4 mg total) under the tongue every 5 (five) minutes x 3 doses as needed for chest pain. Patient not taking: Reported on 01/31/2015 10/08/12   Jake Bathe, MD   Social History   Social History  . Marital Status: Married    Spouse Name: N/A  . Number of Children: N/A  . Years of Education: N/A  Occupational History  . Not on file.   Social History Main Topics  . Smoking status: Former Smoker -- 1.50 packs/day for 7 years    Types: Cigarettes    Quit date: 10/01/2012  . Smokeless tobacco: Not on file  . Alcohol Use: Yes  . Drug Use: No  . Sexual Activity: Not on file   Other Topics Concern  . Not on file   Social History Narrative        Review of Systems  Constitutional: Negative for fatigue and unexpected weight change.  Eyes: Negative for visual disturbance.  Respiratory: Negative for cough, chest tightness and shortness of breath.   Cardiovascular: Negative for chest pain, palpitations and  leg swelling.  Gastrointestinal: Negative for abdominal pain and blood in stool.  Neurological: Negative for dizziness, light-headedness and headaches.       Objective:   Physical Exam  Constitutional: He is oriented to person, place, and time. He appears well-developed and well-nourished.  HENT:  Head: Normocephalic and atraumatic.  Right Ear: External ear normal.  Left Ear: External ear normal.  Mouth/Throat: Oropharynx is clear and moist.  Eyes: Conjunctivae and EOM are normal. Pupils are equal, round, and reactive to light.  Neck: Normal range of motion. Neck supple. No thyromegaly present.  Cardiovascular: Normal rate, regular rhythm, normal heart sounds and intact distal pulses.   Pulmonary/Chest: Effort normal and breath sounds normal. No respiratory distress. He has no wheezes.  Abdominal: Soft. He exhibits no distension. There is no tenderness. A hernia (umbilical, nontender, longstading by patient. reducible. ) is present. Hernia confirmed negative in the right inguinal area and confirmed negative in the left inguinal area.  Musculoskeletal: Normal range of motion. He exhibits no edema or tenderness.  Lymphadenopathy:    He has no cervical adenopathy.  Neurological: He is alert and oriented to person, place, and time. He has normal reflexes.  Skin: Skin is warm and dry.  Psychiatric: He has a normal mood and affect. His behavior is normal.  Vitals reviewed.  Filed Vitals:   01/31/15 1329  BP: 120/80  Pulse: 95  Temp: 98 F (36.7 C)  TempSrc: Oral  Resp: 20  Height: 5\' 11"  (1.803 m)  Weight: 259 lb (117.482 kg)  SpO2: 98%    Visual Acuity Screening   Right eye Left eye Both eyes  Without correction: 20/25 20/30 20/20   With correction:     Comments: The patient can distinguish the colors red, amber and green.Peripheral Vision: Right eye 85 degrees. Left eye 85 degrees.  Hearing Screening Comments: The patient was able to hear a forced whisper from 10  feet.         Assessment & Plan:  Wayne Jordan is a 55 y.o. male Encounter for commercial driver medical examination (CDME)  Snoring - Plan: Ambulatory referral to Sleep Studies   due to physical. History of coronary disease with stent. Up-to-date on cardiology eval and last echo with normal EF. Does have history of snoring with possible pauses in breathing. Denies daytime somnolence or other symptoms during the day, but suspicious for obstructive sleep apnea. As we are his primary care office, referral was placed for  Sleep study, 1 month card until results of this are known. Advised to not drive if sleepy.   long-standing umbilical hernia, asymptomatic, RTC/ER precautions discussed if any change in hernia, and can discuss with PCP regarding need for repair. No orders of the defined types were placed in this encounter.   Patient Instructions  Based on  information from DOT, I was able to provide you a one-month card, while we wait for the sleep study. If the sleep study is normal, can extend that card back out to a year. If there are signs of sleep apnea and you do need to start CPAP, you would be certified initially for 1 month, then 3 months if tolerating the CPAP  And would need to bring compliance report, ultimately once controlled can return to 1 year card. Do not drive or operate heavy machinery if you are sleepy.    if any increased pain, redness, or swelling of the hernia, be seen immediately. Follow-up to discuss this with your primary care provider as well to determine if repair indicated.

## 2015-01-31 NOTE — Patient Instructions (Addendum)
Based on  information from DOT, I was able to provide you a one-month card, while we wait for the sleep study. If the sleep study is normal, can extend that card back out to a year. If there are signs of sleep apnea and you do need to start CPAP, you would be certified initially for 1 month, then 3 months if tolerating the CPAP  And would need to bring compliance report, ultimately once controlled can return to 1 year card. Do not drive or operate heavy machinery if you are sleepy.    if any increased pain, redness, or swelling of the hernia, be seen immediately. Follow-up to discuss this with your primary care provider as well to determine if repair indicated.

## 2015-02-12 ENCOUNTER — Telehealth: Payer: Self-pay | Admitting: Radiology

## 2015-02-12 NOTE — Telephone Encounter (Signed)
Advised wife Abby of patients appt with Neurology tomorrow with Dr Richardean Chimera / Amy

## 2015-02-13 ENCOUNTER — Encounter: Payer: Self-pay | Admitting: Neurology

## 2015-02-13 ENCOUNTER — Ambulatory Visit (INDEPENDENT_AMBULATORY_CARE_PROVIDER_SITE_OTHER): Payer: 59 | Admitting: Neurology

## 2015-02-13 VITALS — BP 138/84 | HR 82 | Resp 20 | Ht 71.0 in | Wt 284.0 lb

## 2015-02-13 DIAGNOSIS — Z9861 Coronary angioplasty status: Secondary | ICD-10-CM

## 2015-02-13 DIAGNOSIS — R0683 Snoring: Secondary | ICD-10-CM

## 2015-02-13 NOTE — Progress Notes (Signed)
SLEEP MEDICINE CLINIC   Provider:  Melvyn Novas, M D  Referring Provider: Meredith Staggers, MD Primary Care Physician:  Sissy Hoff, MD  Chief Complaint  Patient presents with  . New Patient (Initial Visit)    snoring, never had sleep study, rm 10, alone    HPI:  Wayne Jordan is a 55 y.o. male , seen here as a referral  from Dr. Neva Seat  for a sleep evaluation,  He has been snoring since childhood, a has been obese for  many years and Dr Neva Seat  reported he suffered a myocardial infarction in 2014 at the age of 36.  He considers himself a " big snorer". He doesn't remember gasping for breath or waking up air hungry. 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 He is married with 5 children, his wife works in a Industrial/product designer. His last heart cath wa sin 10-2014, and " went well "   Sleep habits are as follows: he goes to bed between 11 PM and midnight, and usually falls asleep promptly. His bedroom is cold, quiet and dark. He is restless, moving all the time.  He sleeps so fast , his wife has no opportunity to speak to him after entering the bedroom. Usually he sleeps until 4 -5 AM , has one bathroom break at that time and goes to back to sleep and rises at 6 AM - when his wife wakes him. That's on days he is not on the road. He has to drive throughout in the [facility]. He usually can sleep in a hotel, he always makes sure to get 6-7 hours of rest. He doesn't consider himself sleepy in daytime. His wife noted some irregular breathing , crescendo snoring. If he naps , he naps for several hours and thus avoids naps. He feels grumpy.   Sleep medical history and family sleep history: No ENT, nasal or neck surgeries. Father was a loud snorer. The patient has 3 brothers , all snore.   Social history: former smoker, quit in 2001 - ETOH: 2 drinks since 1991, Caffeine : 2-3  cheer wine a day.   Review of Systems: Out of a complete 14 system review, the  patient complains of only the following symptoms, and all other reviewed systems are negative.   Epworth score  3, Fatigue severity score 29  , depression score 2   Social History   Social History  . Marital Status: Married    Spouse Name: N/A  . Number of Children: N/A  . Years of Education: N/A   Occupational History  . Not on file.   Social History Main Topics  . Smoking status: Former Smoker -- 1.50 packs/day for 7 years    Types: Cigarettes    Quit date: 10/01/2012  . Smokeless tobacco: Not on file  . Alcohol Use: Yes  . Drug Use: No  . Sexual Activity: Not on file   Other Topics Concern  . Not on file   Social History Narrative   Drinks 2 cheerwines daily.    Family History  Problem Relation Age of Onset  . Heart disease Father   . Heart attack Father   . CAD Father     Past Medical History  Diagnosis Date  . Personal history of urinary calculi   . 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  Hypercholesteremia   . Coronary artery disease     s/P PCI of the LAD with DES  . Hyperlipidemia     Past Surgical History  Procedure Laterality Date  . Inguinal hernia repair Right 03/08/2012    Procedure: HERNIA REPAIR INGUINAL ADULT;  Surgeon: Axel Filler, MD;  Location: [facility] OR;  Service: General;  Laterality: Right;  . Insertion of mesh N/A 03/08/2012    Procedure: INSERTION OF MESH;  Surgeon: Axel Filler, MD;  Location: [facility] OR;  Service: General;  Laterality: N/A;  . Left heart catheterization with coronary angiogram Right 10/07/2012    Procedure: LEFT HEART CATHETERIZATION WITH CORONARY ANGIOGRAM;  Surgeon: Corky Crafts, MD;  Location: [facility] CATH LAB;  Service: Cardiovascular;  Laterality: Right;  . Percutaneous stent intervention  10/07/2012    Procedure: PERCUTANEOUS STENT INTERVENTION;  Surgeon: Corky Crafts, MD;  Location: [facility] CATH LAB;  Service: Cardiovascular;;     Current Outpatient Prescriptions  Medication Sig Dispense Refill  . ascorbic acid (VITAMIN C) 500 MG tablet Take 500 mg by mouth daily.    Marland Kitchen aspirin 81 MG chewable tablet Chew 81 mg by mouth daily.    Marland Kitchen atorvastatin (LIPITOR) 80 MG tablet Take 1 tablet (80 mg total) by mouth daily. 30 tablet 3  . clopidogrel (PLAVIX) 75 MG tablet Take 1 tablet by mouth  daily 90 tablet 3  . metoprolol tartrate (LOPRESSOR) 25 MG tablet Take 1 tablet (25 mg total) by mouth 2 (two) times daily. 180 tablet 3  . nitroGLYCERIN (NITROSTAT) 0.4 MG SL tablet Place 1 tablet (0.4 mg total) under the tongue every 5 (five) minutes x 3 doses as needed for chest pain. 30 tablet 12   No current facility-administered medications for this visit.    Allergies as of 02/13/2015 - Review Complete 02/13/2015  Allergen Reaction Noted  . Coconut fatty acids Shortness Of Breath and Swelling 10/06/2012  . Penicillins Hives and Rash 02/24/2012    Vitals: BP 138/84 mmHg  Pulse 82  Resp 20  Ht  (1.803 m)  Wt 284 lb (128.822 kg)  BMI 39.63 kg/m2 Last Weight:  Wt Readings from Last 1 Encounters:  02/13/15 284 lb (128.822 kg)   EAV:WUJW mass index is 39.63 kg/(m^2).     Last Height:   Ht Readings from Last 1 Encounters:  02/13/15  (1.803 m)    Physical exam:  General: The patient is awake, alert and appears not in acute distress. The patient is well groomed. Head: Normocephalic, atraumatic. Neck is supple. Mallampati 4, narrow and large tongue.   neck circumference:19. Nasal airflow  restricted , TMJ is not evident . Retrognathia is seen.  Cardiovascular:  Regular rate and rhythm , without  murmurs or carotid bruit, and without distended neck veins. Respiratory: Lungs are clear to auscultation. Skin:  Without evidence of edema, or rash Trunk: BMI is elevated . The patient's posture is erect.  Neurologic exam : The patient is awake and alert, oriented to place and time.   Memory subjective  described as  intact.  Attention span & concentration ability appears normal.  Speech is fluent,  without dysarthria, dysphonia or aphasia.  Mood and affect are appropriate.  Cranial nerves: Pupils are equal and briskly reactive to light. Funduscopic exam without  evidence of pallor or edema.  Extraocular movements in vertical and horizontal planes intact and without nystagmus. Visual fields by finger perimetry are intact. Hearing to finger rub intact. Facial sensation intact to fine touch. Facial motor  strength is symmetric and tongue and uvula move midline. Shoulder shrug was symmetrical.   Motor exam:  Normal tone, muscle bulk and symmetric strength in all extremities. Sensory:  Fine touch, pinprick and vibration were tested in all extremities. Proprioception tested in the upper extremities was normal. Coordination: Rapid alternating movements in the fingers/hands was normal. Finger-to-nose maneuver  normal without evidence of ataxia, dysmetria or tremor. Gait and station: Patient walks without assistive device and is able unassisted to climb up to the exam table. Strength within normal limits.Stance is stable and normal.   Deep tendon reflexes: in the  upper and lower extremities are symmetric and intact. Babinski maneuver response is downgoing.  The patient was advised of the nature of the diagnosed sleep disorder , the treatment options and risks for general a health and wellness arising from not treating the condition.  I spent more than 30 minutes of face to face time with the patient. Greater than 50% of time was spent in counseling and coordination of care. We have discussed the diagnosis and differential and I answered the patient's questions.     Assessment:  After physical and neurologic examination, review of laboratory studies,  Personal review of imaging studies, reports of other /same  Imaging studies ,  Results of polysomnography/ neurophysiology testing and pre-existing records as far as  provided in visit., my assessment is   1) based on the patient's wife's report she has witnessed significant snoring and the patient is aware of this for pretty much all his life. His parents and his brothers have also been loud snores. On trips he was given his own hotel room as nobody would share a room with him. 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  2) Sleep study ordered ASAP. SPLIT ASAP.  3) obesity, low carb diet discussed.Low salt diet for cardiac reasons.    Plan:  Treatment plan and additional workup : RV soon after Study.      Porfirio Mylar [name] MD  02/13/2015   CC: Tally Joe, Md [address] [place], [place] [address]

## 2015-02-19 ENCOUNTER — Other Ambulatory Visit (INDEPENDENT_AMBULATORY_CARE_PROVIDER_SITE_OTHER): Payer: 59 | Admitting: *Deleted

## 2015-02-19 DIAGNOSIS — E782 Mixed hyperlipidemia: Secondary | ICD-10-CM | POA: Diagnosis not present

## 2015-02-19 LAB — LIPID PANEL
Cholesterol: 85 mg/dL — ABNORMAL LOW (ref 125–200)
HDL: 37 mg/dL — ABNORMAL LOW (ref >=40)
LDL CALC: 30 mg/dL (ref <130)
TRIGLYCERIDES: 88 mg/dL (ref <150)
Total CHOL/HDL Ratio: 2.3 Ratio (ref <=5.0)
VLDL: 18 mg/dL (ref <30)

## 2015-02-19 LAB — ALT: ALT: 22 U/L (ref 9–46)

## 2015-02-19 NOTE — Addendum Note (Signed)
Addended by: Tonita Phoenix on: 02/19/2015 07:36 AM   Modules accepted: Orders

## 2015-02-20 ENCOUNTER — Ambulatory Visit (INDEPENDENT_AMBULATORY_CARE_PROVIDER_SITE_OTHER): Payer: 59 | Admitting: Neurology

## 2015-02-20 ENCOUNTER — Telehealth: Payer: Self-pay

## 2015-02-20 DIAGNOSIS — G4730 Sleep apnea, unspecified: Secondary | ICD-10-CM | POA: Diagnosis not present

## 2015-02-20 DIAGNOSIS — Z9861 Coronary angioplasty status: Secondary | ICD-10-CM

## 2015-02-20 DIAGNOSIS — R0683 Snoring: Secondary | ICD-10-CM

## 2015-02-20 DIAGNOSIS — E782 Mixed hyperlipidemia: Secondary | ICD-10-CM

## 2015-02-20 MED ORDER — ATORVASTATIN CALCIUM 80 MG PO TABS: 80.0000 mg | ORAL_TABLET | Freq: Every day | ORAL | Status: DC

## 2015-02-20 NOTE — Telephone Encounter (Signed)
Reviewed medications with patient's wife and DPR, Abbie. Patient was taking both Simvastatin AND Atorvastatin.  Instructed her to have patient ONLY TAKE atorvastatin.  Confirmed with patient and DPR that he is not having any side effects.  FLP and ALT scheduled March 31. DPR agrees with treatment plan.

## 2015-02-20 NOTE — Telephone Encounter (Signed)
-----   Message from Quintella Reichert, MD sent at 02/19/2015  4:40 PM EST ----- Lipids at goal continue current therapy

## 2015-02-21 NOTE — Sleep Study (Signed)
Please see the scanned sleep study interpretation located in the procedure tab in the chart view section.  

## 2015-02-27 ENCOUNTER — Telehealth: Payer: Self-pay

## 2015-02-27 DIAGNOSIS — G4733 Obstructive sleep apnea (adult) (pediatric): Secondary | ICD-10-CM

## 2015-02-27 NOTE — Addendum Note (Signed)
Addended by: Geronimo Running A on: 02/27/2015 02:23 PM   Modules accepted: Orders

## 2015-02-27 NOTE — Telephone Encounter (Signed)
I called pt to discuss sleep study results. 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 PAP therapy is indicated. Dr. Vickey Huger recommends proceeding with an in lab titration study to optimize therapy. I also advised him that weight loss and positional therapy could be entertained. Pt is agreeable to coming in for a cpap titration but he wants to know exactly how much it is going to cost before he is scheduled. I advised him that I would let our sleep lab know. Pt verbalized understanding. I advised him to avoid sedative-hypnotics which may worsen sleep apnea, alcohol, and tobacco. I advised him to lose weight, diet, and exercise if not contraindicated by his other physicians. Pt verbalized understanding.

## 2015-03-18 ENCOUNTER — Ambulatory Visit (INDEPENDENT_AMBULATORY_CARE_PROVIDER_SITE_OTHER): Payer: 59 | Admitting: Neurology

## 2015-03-18 DIAGNOSIS — G4733 Obstructive sleep apnea (adult) (pediatric): Secondary | ICD-10-CM

## 2015-03-18 NOTE — Sleep Study (Signed)
Please see the scanned sleep study interpretation located in the Procedure tab within the Chart Review section. 

## 2015-03-24 ENCOUNTER — Telehealth: Payer: Self-pay | Admitting: Family Medicine

## 2015-03-24 NOTE — Telephone Encounter (Signed)
Here for DOT card.  1 month card on 01/31/15 for sleep apnea testing. 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 PAP therapy is indicated  Had CPAP titration 6 days ago. Has not received a call about results or CPAP, or anything prescribed yet.   No daytime somnolence, still snoring at night. No sleepiness when driving.   Advised to call sleep specialist tomorrow and start CPAP as soon as possible if indicated. Will provide 1 month card, then follow up with me with compliance report, with anticipated 3 month card if doing well, then 1 year card. New page 4 and card completed.

## 2015-03-26 NOTE — Telephone Encounter (Signed)
Patient is calling regarding sleep tests that he has had and to discuss possibly a CPAP machine but the patient is not sure. He has questions. Please call and discuss.

## 2015-03-26 NOTE — Telephone Encounter (Signed)
I called pt back to discuss. No answer. Left a message asking him to call me back.

## 2015-03-26 NOTE — Telephone Encounter (Signed)
Please order now. CD ONO while on PAP

## 2015-03-26 NOTE — Addendum Note (Signed)
Addended by: Geronimo RunningINKINS, Houda Brau A on: 03/26/2015 03:00 PM   Modules accepted: Orders

## 2015-03-26 NOTE — Telephone Encounter (Signed)
Pt returned call. Call when you can . Thanks

## 2015-03-26 NOTE — Telephone Encounter (Signed)
I also advised pt that since his hypoxemia was still present, an ONO on cpap might be considered to confirm that oxygen is resolved. Pt verbalized understanding.

## 2015-03-26 NOTE — Addendum Note (Signed)
Addended by: Geronimo RunningINKINS, Chizuko Trine A on: 03/26/2015 12:21 PM   Modules accepted: Orders

## 2015-03-26 NOTE — Telephone Encounter (Signed)
Dr. Vickey Hugerohmeier,  In the sleep study results, it just says "consider ONO on cpap to confirm hypoxemia is resolved." Do you want this ordered now or wait until his follow up?

## 2015-03-26 NOTE — Telephone Encounter (Signed)
I spoke to pt. I advised him that his cpap titration showed significant improvement with less respiratory evens during titration. Hypoxemia was still present for 250 minutes while titrated to CPAP. Dr. Vickey Hugerohmeier recommend starting CPAP at 13 cm H2O with 1 cm flex or air sense and follow clinical symptomatology. Pt is agreeable to starting cpap. I advised pt that I would send an order to a DME and they would call him within a week to get his cpap set up. Pt verbalized understanding. I advised pt to use his cpap at least four or more hours per night. Pt verbalized understanding. A follow up appt was made for 05/26/15 at 8:30 for insurance purposes. Pt verbalized understanding. Will send to [facility].

## 2015-04-04 DIAGNOSIS — G4733 Obstructive sleep apnea (adult) (pediatric): Secondary | ICD-10-CM | POA: Diagnosis not present

## 2015-04-11 ENCOUNTER — Ambulatory Visit (HOSPITAL_COMMUNITY)
Admission: RE | Admit: 2015-04-11 | Discharge: 2015-04-11 | Disposition: A | Discharge status: Home / Self Care | Payer: 59 | Source: Ambulatory Visit | Attending: Family Medicine | Admitting: Family Medicine

## 2015-04-11 ENCOUNTER — Ambulatory Visit (INDEPENDENT_AMBULATORY_CARE_PROVIDER_SITE_OTHER): Payer: 59 | Admitting: Family Medicine

## 2015-04-11 VITALS — BP 138/80 | HR 77 | Temp 98.0°F | Resp 18 | Ht 71.0 in | Wt 267.2 lb

## 2015-04-11 DIAGNOSIS — K429 Umbilical hernia without obstruction or gangrene: Secondary | ICD-10-CM | POA: Diagnosis not present

## 2015-04-11 DIAGNOSIS — K420 Umbilical hernia with obstruction, without gangrene: Secondary | ICD-10-CM | POA: Insufficient documentation

## 2015-04-11 LAB — POCT CBC
GRANULOCYTE PERCENT: 54.9 % (ref 37–80)
HCT, POC: 43.5 % (ref 43.5–53.7)
Hemoglobin: 15.1 g/dL (ref 14.1–18.1)
Lymph, poc: 2.9 (ref 0.6–3.4)
MCH: 30.8 pg (ref 27–31.2)
MCHC: 34.8 g/dL (ref 31.8–35.4)
MCV: 88.5 fL (ref 80–97)
MID (CBC): 0.7 (ref 0–0.9)
MPV: 7.5 fL (ref 0–99.8)
PLATELET COUNT, POC: 196 10*3/uL (ref 142–424)
POC Granulocyte: 4.4 (ref 2–6.9)
POC LYMPH PERCENT: 36.4 %L (ref 10–50)
POC MID %: 8.7 % (ref 0–12)
RBC: 4.91 M/uL (ref 4.69–6.13)
RDW, POC: 12.9 %
WBC: 8.1 10*3/uL (ref 4.6–10.2)

## 2015-04-11 LAB — POC MICROSCOPIC URINALYSIS (UMFC): Mucus: ABSENT

## 2015-04-11 LAB — POCT URINALYSIS DIP (MANUAL ENTRY)
BILIRUBIN UA: NEGATIVE
BILIRUBIN UA: NEGATIVE
GLUCOSE UA: NEGATIVE
Leukocytes, UA: NEGATIVE
Nitrite, UA: NEGATIVE
Protein Ur, POC: NEGATIVE
SPEC GRAV UA: 1.02
Urobilinogen, UA: 0.2
pH, UA: 6

## 2015-04-11 MED ORDER — IOPAMIDOL (ISOVUE-300) INJECTION 61%
100.0000 mL | Freq: Once | INTRAVENOUS | Status: AC | PRN
  Administered 2015-04-11: 100 mL via INTRAVENOUS

## 2015-04-11 NOTE — Progress Notes (Deleted)
Subjective:    Patient ID: Wayne Jordan, male    DOB: [birth year], 55 y.o.   MRN: [id number] By signing my name below, I, Littie Deeds, attest that this documentation has been prepared under the direction and in the presence of Norberto Sorenson, MD.  Electronically Signed: Littie Deeds, Medical Scribe. 04/11/2015. 7:29 PM.  Chief Complaint  Patient presents with   Abdominal Pain    injury, since yesterday    HPI HPI Comments: Wayne Jordan is a 55 y.o. male who presents to the [facility] complaining of sudden onset abdominal pain that started yesterday. Patient has an umbilical hernia. He was adjusting something in his truck when he felt a pop where the hernia is. He states the hernia has been stuck for a while. The pain is worse when bending over. Patient denies constipation, diarrhea, nausea, and vomiting. His last bowel movement was a few hours ago. His appetite has been normal.    Review of Systems  Constitutional: Negative for appetite change.  Gastrointestinal: Positive for abdominal pain. Negative for nausea, vomiting, diarrhea and constipation.       Objective:  BP 138/80 mmHg   Pulse 77   Temp(Src) 98 F (36.7 C) (Oral)   Resp 18   Ht  (1.803 m)   Wt 267 lb 3.2 oz (121.201 kg)   BMI 37.28 kg/m2   SpO2 95%  Physical Exam  Constitutional: He is oriented to person, place, and time. He appears well-developed and well-nourished. No distress.  HENT:  Head: Normocephalic and atraumatic.  Mouth/Throat: Oropharynx is clear and moist. No oropharyngeal exudate.  Eyes: Pupils are equal, round, and reactive to light.  Neck: Neck supple.  Cardiovascular: Normal rate.   Pulmonary/Chest: Effort normal.  Abdominal: Bowel sounds are decreased. There is no rebound and no guarding. A hernia is present.  Hypoactive bowel sounds. Feels like umbilical hernia is incarcerated with skin warm to touch. Possible referred pain.  Musculoskeletal: He exhibits no edema.    Neurological: He is alert and oriented to person, place, and time. No cranial nerve deficit.  Skin: Skin is warm and dry. No rash noted. No erythema.  Psychiatric: He has a normal mood and affect. His behavior is normal.  Nursing note and vitals reviewed.  Results for orders placed or performed in visit on 04/11/15  POCT CBC  Result Value Ref Range   WBC 8.1 4.6 - 10.2 K/uL   Lymph, poc 2.9 0.6 - 3.4   POC LYMPH PERCENT 36.4 10 - 50 %L   MID (cbc) 0.7 0 - 0.9   POC MID % 8.7 0 - 12 %M   POC Granulocyte 4.4 2 - 6.9   Granulocyte percent 54.9 37 - 80 %G   RBC 4.91 4.69 - 6.13 M/uL   Hemoglobin 15.1 14.1 - 18.1 g/dL   HCT, POC 40.9 81.1 - 53.7 %   MCV 88.5 80 - 97 fL   MCH, POC 30.8 27 - 31.2 pg   MCHC 34.8 31.8 - 35.4 g/dL   RDW, POC 91.4 %   Platelet Count, POC 196 142 - 424 K/uL   MPV 7.5 0 - 99.8 fL  POCT urinalysis dipstick  Result Value Ref Range   Color, UA yellow yellow   Clarity, UA clear clear   Glucose, UA negative negative   Bilirubin, UA negative negative   Ketones, POC UA negative negative   Spec Grav, UA 1.020    Blood, UA trace-intact (A)  negative   pH, UA 6.0    Protein Ur, POC negative negative   Urobilinogen, UA 0.2    Nitrite, UA Negative Negative   Leukocytes, UA Negative Negative  POCT Microscopic Urinalysis ([facility])  Result Value Ref Range   WBC,UR,HPF,POC None None WBC/hpf   RBC,UR,HPF,POC None None RBC/hpf   Bacteria Few (A) None, Too numerous to count   Mucus Absent Absent   Epithelial Cells, UR Per Microscopy Few (A) None, Too numerous to count cells/hpf       Assessment & Plan:   1. Incarcerated umbilical hernia   Pt with absent bowel sounds and non-reducible umbilical hernia so sent for STAT abd/pelvic CT scan at [facility] now. Odd that below the hernia is quite warm to touch. Fortunately, he is relatively asymptomatic otherwise.  Orders Placed This Encounter  Procedures   CT Abdomen Pelvis W Contrast    Standing Status: Future     Number  of Occurrences:      Standing Expiration Date: 07/11/2016    Order Specific Question:  If indicated for the ordered procedure, I authorize the administration of contrast media per Radiology protocol    Answer:  Yes    Order Specific Question:  Reason for Exam (SYMPTOM  OR DIAGNOSIS REQUIRED)    Answer:  concern for incarceration of umbilical hernia    Order Specific Question:  Preferred imaging location?    Answer:  External   POCT CBC   POCT urinalysis dipstick   POCT Microscopic Urinalysis ([facility])   I personally performed the services described in this documentation, which was scribed in my presence. The recorded information has been reviewed and considered, and addended by me as needed.  Norberto SorensonEva Shaw, MD MPH

## 2015-04-11 NOTE — Patient Instructions (Addendum)
YOU ARE TO GO OVER TO [facility] NOW, LET THEM KNOW THAT YOU ARE THERE FOR A CT SCAN.    IF you received an x-ray today, you will receive an invoice from [facility]. Please contact [facility]iology at [phone] with questions or concerns regarding your invoice.   IF you received labwork today, you will receive an invoice from [employer]. Please contact [facility] at [phone] with questions or concerns regarding your invoice.   Our billing staff will not be able to assist you with questions regarding bills from these companies.  You will be contacted with the lab results as soon as they are available. The fastest way to get your results is to activate your My Chart account. Instructions are located on the last page of this paperwork. If you have not heard from [place] regarding the results in 2 weeks, please contact this office.     Ventral Hernia A ventral hernia (also called an incisional hernia) is a hernia that occurs at the site of a previous surgical cut (incision) in the abdomen. The abdominal wall spans from your lower chest down to your pelvis. If the abdominal wall is weakened from a surgical incision, a hernia can occur. A hernia is a bulge of bowel or muscle tissue pushing out on the weakened part of the abdominal wall. Ventral hernias can get bigger from straining or lifting. Obese and older people are at higher risk for a ventral hernia. People who develop infections after surgery or require repeat incisions at the same site on the abdomen are also at increased risk. CAUSES  A ventral hernia occurs because of weakness in the abdominal wall at an incision site.  SYMPTOMS  Common symptoms include:  A visible bulge or lump on the abdominal wall.  Pain or tenderness around the lump.  Increased discomfort if you cough or make a sudden movement. If the hernia has blocked part of the intestine, a serious complication can occur  (incarcerated or strangulated hernia). This can become a problem that requires emergency surgery because the blood flow to the blocked intestine may be cut off. Symptoms may include:  Feeling sick to your stomach (nauseous).  Throwing up (vomiting).  Stomach swelling (distention) or bloating.  Fever.  Rapid heartbeat. DIAGNOSIS  Your health care provider will take a medical history and perform a physical exam. Various tests may be ordered, such as:  Blood tests.  Urine tests.  Ultrasonography.  X-rays.  Computed tomography (CT). TREATMENT  Watchful waiting may be all that is needed for a smaller hernia that does not cause symptoms. Your health care provider may recommend the use of a supportive belt (truss) that helps to keep the abdominal wall intact. For larger hernias or those that cause pain, surgery to repair the hernia is usually recommended. If a hernia becomes strangulated, emergency surgery needs to be done right away. HOME CARE INSTRUCTIONS  Avoid putting pressure or strain on the abdominal area.  Avoid heavy lifting.  Use good body positioning for physical tasks. Ask your health care provider about proper body positioning.  Use a supportive belt as directed by your health care provider.  Maintain a healthy weight.  Eat foods that are high in fiber, such as whole grains, fruits, and vegetables. Fiber helps prevent difficult bowel movements (constipation).  Drink enough fluids to keep your urine clear or pale yellow.  Follow up with your health care provider as directed. SEEK MEDICAL CARE IF:   Your hernia  seems to be getting larger or more painful. SEEK IMMEDIATE MEDICAL CARE IF:   You have abdominal pain that is sudden and sharp.  Your pain becomes severe.  You have repeated vomiting.  You are sweating a lot.  You notice a rapid heartbeat.  You develop a fever. MAKE SURE YOU:   Understand these instructions.  Will watch your condition.  Will  get help right away if you are not doing well or get worse.   This information is not intended to replace advice given to you by your health care provider. Make sure you discuss any questions you have with your health care provider.   Document Released: 12/22/2011 Document Revised: 01/25/2014 Document Reviewed: 12/22/2011 Elsevier Interactive Patient Education [employer]2016 Elsevier Inc.

## 2015-04-11 NOTE — Progress Notes (Signed)
Subjective:    Patient ID: Wayne Jordan, male    DOB: 06/20/1960, 55 y.o.   MRN: [id number] By signing my name below, I, Wayne Jordan, attest that this documentation has been prepared under the direction and in the presence of Norberto SorensonEva Mandalyn Pasqua, MD.  Electronically Signed: Littie Deedsichard Jordan, Medical Scribe. 04/11/2015. 8:27 PM.  Chief Complaint  Patient presents with  . Abdominal Pain    injury, since yesterday    Abdominal Pain Pertinent negatives include no constipation, diarrhea, fever, nausea or vomiting.   HPI Comments: Wayne AppHenry B Ow is a 55 y.o. male who presents to the [facility] complaining of sudden onset abdominal pain that started yesterday. Patient has an umbilical hernia. He was adjusting something in his truck when he felt a pop where the hernia is. He states the hernia has been stuck for a while. The pain is worse when bending over. Patient denies constipation, diarrhea, nausea, and vomiting. His last bowel movement was a few hours ago. His appetite has been normal.   Past Medical History  Diagnosis Date  . Personal history of urinary calculi   . 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 Hypercholesteremia   . Coronary artery disease     s/P PCI of the LAD with DES  . Hyperlipidemia    Past Surgical History  Procedure Laterality Date  . Inguinal hernia repair Right 03/08/2012    Procedure: HERNIA REPAIR INGUINAL ADULT;  Surgeon: Axel FillerArmando Ramirez, MD;  [facility];  Service: General;  Laterality: Right;  . Insertion of mesh N/A 03/08/2012    Procedure: INSERTION OF MESH;  Surgeon: Axel FillerArmando Ramirez, MD;  [facility];  Service: General;  Laterality: N/A;  . Left heart catheterization with coronary angiogram Right 10/07/2012    Procedure: LEFT HEART CATHETERIZATION WITH CORONARY ANGIOGRAM;  Surgeon: Corky CraftsJayadeep S Varanasi, MD;  [facility];  Service: Cardiovascular;  Laterality: Right;   . Percutaneous stent intervention  10/07/2012    Procedure: PERCUTANEOUS STENT INTERVENTION;  Surgeon: Corky CraftsJayadeep S Varanasi, MD;  [facility];  Service: Cardiovascular;;   Current Outpatient Prescriptions on File Prior to Visit  Medication Sig Dispense Refill  . ascorbic acid (VITAMIN C) 500 MG tablet Take 500 mg by mouth daily.    Marland Kitchen. aspirin 81 MG chewable tablet Chew 81 mg by mouth daily.    Marland Kitchen. atorvastatin (LIPITOR) 80 MG tablet Take 1 tablet (80 mg total) by mouth daily. 90 tablet 3  . clopidogrel (PLAVIX) 75 MG tablet Take 1 tablet by mouth  daily 90 tablet 3  . metoprolol tartrate (LOPRESSOR) 25 MG tablet Take 1 tablet (25 mg total) by mouth 2 (two) times daily. 180 tablet 3  . nitroGLYCERIN (NITROSTAT) 0.4 MG SL tablet Place 1 tablet (0.4 mg total) under the tongue every 5 (five) minutes x 3 doses as needed for chest pain. 30 tablet 12   No current facility-administered medications on file prior to visit.   Allergies  Allergen Reactions  . Coconut Fatty Acids Shortness Of Breath and Swelling  . Penicillins Hives and Rash   Family History  Problem Relation Age of Onset  . Heart disease Father   . Heart attack Father   . CAD Father    Social History   Social History  . Marital Status: Married    Spouse Name: N/A  . Number of Children: N/A  .  Years of Education: N/A   Social History Main Topics  . Smoking status: Former Smoker -- 1.50 packs/day for 7 years    Types: Cigarettes    Quit date: 10/01/2012  . Smokeless tobacco: None  . Alcohol Use: Yes  . Drug Use: No  . Sexual Activity: Not Asked   Other Topics Concern  . None   Social History Narrative   Drinks 2 cheerwines daily.     Review of Systems  Constitutional: Positive for activity change. Negative for fever, chills, appetite change, fatigue and unexpected weight change.  Gastrointestinal: Positive for abdominal pain and abdominal distention. Negative for nausea, vomiting, diarrhea, constipation,  blood in stool and anal bleeding.  Skin: Positive for color change. Negative for rash.  Neurological: Positive for weakness. Negative for numbness.  Hematological: Negative for adenopathy. Does not bruise/bleed easily.  Psychiatric/Behavioral: Negative for sleep disturbance. The patient is not nervous/anxious.        Objective:  BP 138/80 mmHg  Pulse 77  Temp(Src) 98 F (36.7 C) (Oral)  Resp 18  Ht  (1.803 m)  Wt 267 lb 3.2 oz (121.201 kg)  BMI 37.28 kg/m2  SpO2 95%  Physical Exam  Constitutional: He is oriented to person, place, and time. He appears well-developed and well-nourished. No distress.  HENT:  Head: Normocephalic and atraumatic.  Mouth/Throat: Oropharynx is clear and moist. No oropharyngeal exudate.  Eyes: Pupils are equal, round, and reactive to light.  Neck: Neck supple.  Cardiovascular: Normal rate.   Pulmonary/Chest: Effort normal.  Abdominal: He exhibits distension and mass. Bowel sounds are absent. There is hepatomegaly. There is tenderness in the periumbilical area. There is no rigidity, no rebound and no guarding. A hernia is present. Hernia confirmed positive in the ventral area.  umbilical hernia warm, purplish hue, protruding - is very tender to touch and not reducible. skin below hernia - suprapubic area - is warm to touch, no skin change. Referred pain.  Musculoskeletal: He exhibits no edema.  Neurological: He is alert and oriented to person, place, and time. No cranial nerve deficit.  Skin: Skin is warm and dry. No rash noted. No erythema.  Psychiatric: He has a normal mood and affect. His behavior is normal.  Nursing note and vitals reviewed.  Results for orders placed or performed in visit on 04/11/15  POCT CBC  Result Value Ref Range   WBC 8.1 4.6 - 10.2 K/uL   Lymph, poc 2.9 0.6 - 3.4   POC LYMPH PERCENT 36.4 10 - 50 %L   MID (cbc) 0.7 0 - 0.9   POC MID % 8.7 0 - 12 %M   POC Granulocyte 4.4 2 - 6.9   Granulocyte percent 54.9 37 - 80 %G     RBC 4.91 4.69 - 6.13 M/uL   Hemoglobin 15.1 14.1 - 18.1 g/dL   HCT, POC 16.1 09.6 - 53.7 %   MCV 88.5 80 - 97 fL   MCH, POC 30.8 27 - 31.2 pg   MCHC 34.8 31.8 - 35.4 g/dL   RDW, POC 04.5 %   Platelet Count, POC 196 142 - 424 K/uL   MPV 7.5 0 - 99.8 fL  POCT urinalysis dipstick  Result Value Ref Range   Color, UA yellow yellow   Clarity, UA clear clear   Glucose, UA negative negative   Bilirubin, UA negative negative   Ketones, POC UA negative negative   Spec Grav, UA 1.020    Blood, UA trace-intact (A) negative  pH, UA 6.0    Protein Ur, POC negative negative   Urobilinogen, UA 0.2    Nitrite, UA Negative Negative   Leukocytes, UA Negative Negative  POCT Microscopic Urinalysis ([facility])  Result Value Ref Range   WBC,UR,HPF,POC None None WBC/hpf   RBC,UR,HPF,POC None None RBC/hpf   Bacteria Few (A) None, Too numerous to count   Mucus Absent Absent   Epithelial Cells, UR Per Microscopy Few (A) None, Too numerous to count cells/hpf       Assessment & Plan:   1. Incarcerated umbilical hernia   Pt with absent bowel sounds and non-reducible umbilical hernia so sent for STAT abd/pelvic CT scan at [facility] now. Odd that below the hernia is quite warm to touch. Fortunately, he is relatively asymptomatic otherwise.  Orders Placed This Encounter  Procedures  . CT Abdomen Pelvis W Contrast    Standing Status: Future     Number of Occurrences:      Standing Expiration Date: 07/11/2016    Order Specific Question:  If indicated for the ordered procedure, I authorize the administration of contrast media per Radiology protocol    Answer:  Yes    Order Specific Question:  Reason for Exam (SYMPTOM  OR DIAGNOSIS REQUIRED)    Answer:  concern for incarceration of umbilical hernia    Order Specific Question:  Preferred imaging location?    Answer:  External  . POCT CBC  . POCT urinalysis dipstick  . POCT Microscopic Urinalysis ([facility])   I personally performed the services described in this  documentation, which was scribed in my presence. The recorded information has been reviewed and considered, and addended by me as needed.  Norberto Sorenson, MD MPH

## 2015-04-13 ENCOUNTER — Encounter: Payer: Self-pay | Admitting: *Deleted

## 2015-04-14 ENCOUNTER — Other Ambulatory Visit (HOSPITAL_COMMUNITY): Payer: 59

## 2015-04-18 ENCOUNTER — Other Ambulatory Visit (INDEPENDENT_AMBULATORY_CARE_PROVIDER_SITE_OTHER): Payer: 59 | Admitting: *Deleted

## 2015-04-18 DIAGNOSIS — E782 Mixed hyperlipidemia: Secondary | ICD-10-CM | POA: Diagnosis not present

## 2015-04-18 LAB — ALT: ALT: 22 U/L (ref 9–46)

## 2015-04-18 LAB — LIPID PANEL
CHOL/HDL RATIO: 2.6 ratio (ref <=5.0)
CHOLESTEROL: 103 mg/dL — AB (ref 125–200)
HDL: 40 mg/dL (ref >=40)
LDL Cholesterol: 41 mg/dL (ref <130)
TRIGLYCERIDES: 112 mg/dL (ref <150)
VLDL: 22 mg/dL (ref <30)

## 2015-04-22 ENCOUNTER — Telehealth: Payer: Self-pay | Admitting: Cardiology

## 2015-04-22 NOTE — Telephone Encounter (Signed)
Notes Recorded by Henrietta DineKathryn A Kemp, RN on 04/22/2015 at 3:38 PM Informed patient of results and verbal understanding expressed.

## 2015-04-22 NOTE — Telephone Encounter (Signed)
F/u  Pt returning Rn phone call- l;ab work- please call work #- [phone]

## 2015-05-02 ENCOUNTER — Encounter: Payer: Self-pay | Admitting: Cardiology

## 2015-05-02 ENCOUNTER — Telehealth: Payer: Self-pay

## 2015-05-02 ENCOUNTER — Telehealth: Payer: Self-pay | Admitting: Cardiology

## 2015-05-02 NOTE — Telephone Encounter (Signed)
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 The pt wants to know if he needs to have a stress test in order to get letter and if so he is requesting to have done asap as his job requires a letter of clearance. Please advise.

## 2015-05-02 NOTE — Telephone Encounter (Signed)
The pt is advised that his letter and a copy of his Myoveiw from 4/16 has been left at the front desk and he can pick up at his convenience between 8 and 5 Monday through Friday. He states that he will come by on Monday to receive.

## 2015-05-02 NOTE — Telephone Encounter (Signed)
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

## 2015-05-02 NOTE — Telephone Encounter (Signed)
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

## 2015-05-02 NOTE — Telephone Encounter (Signed)
**Note De-Identified Wayne Jordan Obfuscation** DOT forms are being filled out by Urgent care MD. 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

## 2015-05-02 NOTE — Telephone Encounter (Signed)
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  He has a history of ASCAD with NSTEMI 09/2012 and underwent PCI of LAD.  He has been stable since with no anginal symptoms.  He was last seen 10/2014 and was asymptomatic from a cardiac standpoint.

## 2015-05-02 NOTE — Telephone Encounter (Signed)
Will need to check with his DOT physician to see if stress test is required

## 2015-05-02 NOTE — Telephone Encounter (Signed)
Patient stated if he has a stress test within the last two years that was fine, that he would not need a stress test. Patient has recent stress test 05/10/14, which result notes state stress test was fine. Patient needs a cardiac clearance letter. Will forward to Dr. Mayford Knifeurner.

## 2015-05-02 NOTE — Telephone Encounter (Signed)
New message   Pt verbalized that he is returning a call to Larita FifeLynn   About his stress test of April of last year  Pt wants a pt of clearance and a copy of his stress test to a Dr.Lee in [place], [place]  For a DOT physical

## 2015-05-02 NOTE — Telephone Encounter (Signed)
I left a detailed message on the pts VM stating that Dr Mayford Knifeurner has written his letter and that it is ready for him to pick up at his convenience between 8 and 5 Monday through Friday.  I left the office phone number for him to call back if he has any questions or concerns.

## 2015-05-05 DIAGNOSIS — G4733 Obstructive sleep apnea (adult) (pediatric): Secondary | ICD-10-CM | POA: Diagnosis not present

## 2015-05-22 DIAGNOSIS — G4730 Sleep apnea, unspecified: Secondary | ICD-10-CM | POA: Diagnosis not present

## 2015-05-23 DIAGNOSIS — G4730 Sleep apnea, unspecified: Secondary | ICD-10-CM | POA: Diagnosis not present

## 2015-05-26 ENCOUNTER — Encounter: Payer: Self-pay | Admitting: Neurology

## 2015-05-26 ENCOUNTER — Ambulatory Visit (INDEPENDENT_AMBULATORY_CARE_PROVIDER_SITE_OTHER): Payer: 59 | Admitting: Neurology

## 2015-05-26 VITALS — BP 124/76 | HR 62 | Resp 20 | Ht 71.5 in | Wt 263.0 lb

## 2015-05-26 DIAGNOSIS — G4733 Obstructive sleep apnea (adult) (pediatric): Secondary | ICD-10-CM | POA: Insufficient documentation

## 2015-05-26 DIAGNOSIS — Z9989 Dependence on other enabling machines and devices: Principal | ICD-10-CM

## 2015-05-26 HISTORY — DX: Morbid (severe) obesity due to excess calories: E66.01

## 2015-05-26 NOTE — Progress Notes (Signed)
SLEEP MEDICINE CLINIC   Provider:  Melvyn Novas, M D  Referring Provider: Meredith Staggers, MD Primary Care Physician:  Sissy Hoff, MD  Chief Complaint  Patient presents with  . Follow-up    cpap going well, rm 11, alone    HPI:  Wayne Jordan is a 55 y.o. male , seen here as a referral  from Dr. Neva Seat  for a sleep evaluation,  He has been snoring since childhood, a has been obese for  many years and Dr Neva Seat  reported he suffered a myocardial infarction in 2014 at the age of 58.  He considers himself a " big snorer". He doesn't remember gasping for breath or waking up air hungry. 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 He is married with 5 children, his wife works in a Industrial/product designer. His last heart cath wa sin 10-2014, and " went well "   Sleep habits are as follows: he goes to bed between 11 PM and midnight, and usually falls asleep promptly. His bedroom is cold, quiet and dark. He is restless, moving all the time.  He sleeps so fast , his wife has no opportunity to speak to him after entering the bedroom. Usually he sleeps until 4 -5 AM , has one bathroom break at that time and goes to back to sleep and rises at 6 AM - when his wife wakes him. That's on days he is not on the road. He has to drive throughout in the [facility]. He usually can sleep in a hotel, he always makes sure to get 6-7 hours of rest. He doesn't consider himself sleepy in daytime. His wife noted some irregular breathing , crescendo snoring. If he naps , he naps for several hours and thus avoids naps. He feels grumpy.   Sleep medical history and family sleep history: No ENT, nasal or neck surgeries. Father was a loud snorer. The patient has 3 brothers , all snore.  Social history: former smoker, quit in 2001 - ETOH: 2 drinks since 1991, Caffeine : 2-3  cheer wine a day.    I had the pleasure of seeing Wayne Jordan today in a revisit from 05/26/2015.  Wayne Jordan  underwent a sleep study on 02/20/2015 upon referral by Dr. Meredith Staggers, he had a mild sleep apnea with an AHI of 12.1, REM AHI 21.1 and supine AHI 23.9. He did have prolonged hypoxemia at 85 minutes of the total night of sleep were considered in hypoxemia with a nadir of 77%. For this reason CPAP was recommended and not a dental device or ENT procedure. He'll return for CPAP titration on February 28 and did extremely well. 13 cm water pressure was determined as the one that gave him the relief with an AHI of 0.0 and the oxygen nadir rose to 89%  He stated today that he needed about a week only to get use to use CPAP. He has a 96.7% excellent compliance with CPAP at home over the last 30 days the average AHI is also very good at 2.4 CPAP is used at 13 cm water with an average user time of 5 hours and 2 minutes at night the patient drives some nights for his work-related requirements and he will use the CPAP than in daytime. A pulse oximetry was performed after he had started the CPAP to confirm that she no longer suffers from hypoxia. His wake time oxygen level average  is 94%, the time was lower than 89% of oxygen was reduced to 9.9 minutes. There is no need for additional oxygen supplementing CPAP.  Review of Systems: Out of a complete 14 system review, the patient complains of only the following symptoms, and all other reviewed systems are negative.  " when I go to sleep, I am out'   Epworth score  3, Fatigue severity score 29  , depression score 2   Social History   Social History  . Marital Status: Married    Spouse Name: N/A  . Number of Children: N/A  . Years of Education: N/A   Occupational History  . Not on file.   Social History Main Topics  . Smoking status: Former Smoker -- 1.50 packs/day for 7 years    Types: Cigarettes    Quit date: 10/01/2012  . Smokeless tobacco: Not on file  . Alcohol Use: Yes  . Drug Use: No  . Sexual Activity: Not on file   Other Topics Concern  .  Not on file   Social History Narrative   Drinks 2 cheerwines daily.    Family History  Problem Relation Age of Onset  . Heart disease Father   . Heart attack Father   . CAD Father     Past Medical History  Diagnosis Date  . Personal history of urinary calculi   . 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 Hypercholesteremia   . Coronary artery disease     s/P PCI of the LAD with DES  . Hyperlipidemia     Past Surgical History  Procedure Laterality Date  . Inguinal hernia repair Right 03/08/2012    Procedure: HERNIA REPAIR INGUINAL ADULT;  Surgeon: Axel Filler, MD;  Location: [facility] OR;  Service: General;  Laterality: Right;  . Insertion of mesh N/A 03/08/2012    Procedure: INSERTION OF MESH;  Surgeon: Axel Filler, MD;  Location: [facility] OR;  Service: General;  Laterality: N/A;  . Left heart catheterization with coronary angiogram Right 10/07/2012    Procedure: LEFT HEART CATHETERIZATION WITH CORONARY ANGIOGRAM;  Surgeon: Corky Crafts, MD;  Location: [facility] CATH LAB;  Service: Cardiovascular;  Laterality: Right;  . Percutaneous stent intervention  10/07/2012    Procedure: PERCUTANEOUS STENT INTERVENTION;  Surgeon: Corky Crafts, MD;  Location[facility] C[facility] LAB;  Service: Cardiovascular;;    Current Outpatient Prescriptions  Medication Sig Dispense Refill  . ascorbic acid (VITAMIN C) 500 MG tablet Take 500 mg by mouth daily.    Marland Kitchen aspirin 81 MG chewable tablet Chew 81 mg by mouth daily.    Marland Kitchen atorvastatin (LIPITOR) 80 MG tablet Take 1 tablet (80 mg total) by mouth daily. 90 tablet 3  . clopidogrel (PLAVIX) 75 MG tablet Take 1 tablet by mouth  daily 90 tablet 3  . metoprolol tartrate (LOPRESSOR) 25 MG tablet Take 1 tablet (25 mg total) by mouth 2 (two) times daily. 180 tablet 3  . nitroGLYCERIN (NITROSTAT) 0.4 MG SL tablet Place 1 tablet (0.4 mg total) under the tongue every 5 (five) minutes x 3 doses as needed  for chest pain. 30 tablet 12   No current facility-administered medications for this visit.    Allergies as of 05/26/2015 - Review Complete 05/26/2015  Allergen Reaction Noted  . Coconut fatty acids Shortness Of Breath and Swelling 10/06/2012  . Penicillins Hives and Rash 02/24/2012    Vitals: BP 124/76 mmHg  Pulse 62  Resp 20  Ht 5' 11.5" (1.816 m)  Wt 263 lb (119.296 kg)  BMI 36.17 kg/m2 Last Weight:  Wt Readings from Last 1 Encounters:  05/26/15 263 lb (119.296 kg)   ZHY:QMVHBMI:Body mass index is 36.17 kg/(m^2).     Last Height:   Ht Readings from Last 1 Encounters:  05/26/15 5' 11.5" (1.816 m)    Physical exam:  General: The patient is awake, alert and appears not in acute distress. The patient is well groomed. Head: Normocephalic, atraumatic. Neck is supple. Mallampati 4, narrow and large tongue.   neck circumference:19. Nasal airflow  restricted , TMJ is not evident . Retrognathia is seen.  Cardiovascular:  Regular rate and rhythm , without  murmurs or carotid bruit, and without distended neck veins. Respiratory: Lungs are clear to auscultation. Skin:  Without evidence of edema, or rash Trunk: BMI is elevated. The patient's posture is erect.  Neurologic exam : The patient is awake and alert, oriented to place and time.   Memory subjective  described as intact.  Attention span & concentration ability appears normal.  Speech is fluent,  without dysarthria, dysphonia or aphasia.  Mood and affect are appropriate.  Cranial nerves: Pupils are equal and briskly reactive to light. Funduscopic exam without  evidence of pallor or edema.  Extraocular movements in vertical and horizontal planes intact and without nystagmus. Visual fields by finger perimetry are intact. Hearing to finger rub intact. Facial sensation intact to fine touch. Facial motor strength is symmetric and tongue and uvula move midline. Shoulder shrug was symmetrical.   Motor exam:  Normal tone, muscle bulk and  symmetric strength in all extremities.  The patient was advised of the nature of the diagnosed sleep disorder , the treatment options and risks for general a health and wellness arising from not treating the condition.  I spent more than 15 minutes of face to face time with the patient. Greater than 50% of time was spent in counseling and coordination of care. We have discussed the diagnosis and differential and I answered the patient's questions.     Assessment:  After physical and neurologic examination, review of laboratory studies,  Personal review of imaging studies, reports of other /same  Imaging studies ,  Results of polysomnography/ neurophysiology testing and pre-existing records as far as provided in visit., my assessment is   1)  OSA was mild, but REM and supine accentuated, dream wear mask and dream station at 13 work well for him.  Data reviewed, studies reviewed.   2) Hypoxemia was severe during baseline PSG , now corrected with CPAP only. ONO follow up on CPAP was negative for hypoxia.   3) obesity, low carb diet discussed.Low salt diet for cardiac reasons.   4) allowed to drive- no DOT concerns due to high compliance. .    Plan:  Treatment plan and additional workup :  RV in 12 month with CPAP data.   Wayne Mylararmen Jaeshawn Silvio MD  05/26/2015   CC: Tally Joeavid Swayne, Md [address] [place] [place], [place] [id number]

## 2015-06-04 DIAGNOSIS — G4733 Obstructive sleep apnea (adult) (pediatric): Secondary | ICD-10-CM | POA: Diagnosis not present

## 2015-07-05 DIAGNOSIS — G4733 Obstructive sleep apnea (adult) (pediatric): Secondary | ICD-10-CM | POA: Diagnosis not present

## 2015-07-06 DIAGNOSIS — H5213 Myopia, bilateral: Secondary | ICD-10-CM | POA: Diagnosis not present

## 2015-10-14 ENCOUNTER — Ambulatory Visit (INDEPENDENT_AMBULATORY_CARE_PROVIDER_SITE_OTHER): Payer: 59 | Admitting: Family Medicine

## 2015-10-14 VITALS — BP 122/72 | HR 73 | Temp 98.3°F | Resp 17 | Ht 72.0 in | Wt 266.0 lb

## 2015-10-14 DIAGNOSIS — Z23 Encounter for immunization: Secondary | ICD-10-CM | POA: Diagnosis not present

## 2015-10-14 DIAGNOSIS — B356 Tinea cruris: Secondary | ICD-10-CM

## 2015-10-14 MED ORDER — FLUCONAZOLE 150 MG PO TABS
150.0000 mg | ORAL_TABLET | Freq: Once | ORAL | 0 refills | Status: AC
Start: 2015-10-14 — End: 2015-10-14

## 2015-10-14 MED ORDER — KETOCONAZOLE 2 % EX CREA: 1.0000 "application " | TOPICAL_CREAM | Freq: Two times a day (BID) | CUTANEOUS | 4 refills | Status: DC

## 2015-10-14 NOTE — Progress Notes (Signed)
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 He's had a right groin rash for over week. He's had problems with this from time to time the past but usually clears after a few days. The rash is right groin only.  He's had no fever. Says it applying cool water seems to ease some of the discomfort.  Patient requests flu vaccine  Objective:BP 122/72 (BP Location: Right Arm, Patient Position: Sitting, Cuff Size: Normal)   Pulse 73   Temp 98.3 F (36.8 C) (Oral)   Resp 17   Ht 6' (1.829 m)   Wt 266 lb (120.7 kg)   SpO2 95%   BMI 36.08 kg/m  No acute distress Right groin reveals reddened and hyperpigmented confluent rash with sharp borders.  Assessment: Tinea crura, moderate and getting worse  Plan: Nizoral cream and Diflucan tablet  Tinea cruris - Plan: fluconazole (DIFLUCAN) 150 MG tablet, ketoconazole (NIZORAL) 2 % cream  Encounter for immunization - Plan: Flu Vaccine QUAD 36+ mos IM    signed, Sheila OatsKurt Nikitta Sobiech M.D.

## 2015-10-14 NOTE — Patient Instructions (Addendum)
   IF you received an x-ray today, you will receive an invoice from [facility]. Please contact  Radiology at [phone] with questions or concerns regarding your invoice.   IF you received labwork today, you will receive an invoice from [facility]. Please contact [facility] at [phone] with questions or concerns regarding your invoice.   Our billing staff will not be able to assist you with questions regarding bills from these companies.  You will be contacted with the lab results as soon as they are available. The fastest way to get your results is to activate your My Chart account. Instructions are located on the last page of this paperwork. If you have not heard from us regarding the results in 2 weeks, please contact this office.    Jock Itch Jock itch (tinea cruris) is a fungal infection of the skin in the groin area. It is sometimes called ringworm, even though it is not caused by worms. It is caused by a fungus, which is a type of germ that thrives in dark, damp places. Jock itch causes a rash and itching in the groin and upper thigh area. It usually goes away in 2-3 weeks with treatment. CAUSES The fungus that causes jock itch may be spread by:  Touching a fungus infection elsewhere on your body--such as athlete's foot--and then touching your groin area.  Sharing towels or clothing with an infected person. RISK FACTORS Jock itch is most common in men and adolescent boys. This condition is more likely to develop from:  Being in hot, humid climates.  Wearing tight-fitting clothing or wet bathing suits for long periods of time.  Participating in sports.  Being overweight.  Having diabetes. SYMPTOMS Symptoms of jock itch may include:  A red, pink, or brown rash in the groin area. The rash may spread to the thighs, anus, and buttocks.  Dry and scaly skin on or around the rash.  Itchiness. DIAGNOSIS Most often, a health  care provider can make the diagnosis by looking at your rash. Sometimes, a scraping of the infected skin will be taken. This sample may be tested by looking at it under a microscope or by trying to grow the fungus from the sample (culture).  TREATMENT Treatment for this condition may include:  Antifungal medicine to kill the fungus. This may be in various forms:  Skin cream or ointment.  Medicine taken by mouth.  Skin cream or ointment to reduce the itching.  Compresses or medicated powders to dry the infected skin. HOME CARE INSTRUCTIONS  Take medicines only as directed by your health care provider. Apply skin creams or ointments exactly as directed.  Wear loose-fitting clothing.  Men should wear cotton boxer shorts.  Women should wear cotton underwear.  Change your underwear every day to keep your groin dry.  Avoid hot baths.  Dry your groin area well after bathing.  Use a separate towel to dry your groin area. This will help to prevent a spreading of the infection to other areas of your body.  Do not scratch the affected area.  Do not share towels with other people. SEEK MEDICAL CARE IF:  Your rash does not improve or it gets worse after 2 weeks of treatment.  Your rash is spreading.  Your rash returns after treatment is finished.  You have a fever.  You have redness, swelling, or pain in the area around your rash.  You have fluid, blood, or pus coming from your rash.  Your   have your rash for more than 4 weeks.   This information is not intended to replace advice given to you by your health care provider. Make sure you discuss any questions you have with your health care provider.   Document Released: 12/25/2001 Document Revised: 01/25/2014 Document Reviewed: 10/16/2013 Elsevier Interactive Patient Education 2016 Elsevier Inc.  

## 2015-10-28 ENCOUNTER — Other Ambulatory Visit: Payer: Self-pay | Admitting: Cardiology

## 2015-12-15 ENCOUNTER — Other Ambulatory Visit: Payer: Self-pay | Admitting: Cardiology

## 2015-12-21 ENCOUNTER — Other Ambulatory Visit: Payer: Self-pay | Admitting: Cardiology

## 2016-02-16 ENCOUNTER — Other Ambulatory Visit: Payer: Self-pay | Admitting: *Deleted

## 2016-02-16 MED ORDER — NITROGLYCERIN 0.4 MG SL SUBL: 0.4000 mg | SUBLINGUAL_TABLET | SUBLINGUAL | 3 refills | Status: DC | PRN

## 2016-02-16 MED ORDER — METOPROLOL TARTRATE 25 MG PO TABS: 25.0000 mg | ORAL_TABLET | Freq: Two times a day (BID) | ORAL | 0 refills | Status: DC

## 2016-02-16 MED ORDER — CLOPIDOGREL BISULFATE 75 MG PO TABS: 75.0000 mg | ORAL_TABLET | Freq: Every day | ORAL | 0 refills | Status: DC

## 2016-02-27 ENCOUNTER — Encounter: Payer: Self-pay | Admitting: Cardiology

## 2016-03-03 ENCOUNTER — Encounter: Payer: Self-pay | Admitting: Cardiology

## 2016-03-03 ENCOUNTER — Ambulatory Visit (INDEPENDENT_AMBULATORY_CARE_PROVIDER_SITE_OTHER): Payer: BC Managed Care – PPO | Admitting: Cardiology

## 2016-03-03 ENCOUNTER — Encounter (INDEPENDENT_AMBULATORY_CARE_PROVIDER_SITE_OTHER): Payer: Self-pay

## 2016-03-03 VITALS — BP 116/80 | HR 96 | Ht 71.0 in | Wt 258.8 lb

## 2016-03-03 DIAGNOSIS — E782 Mixed hyperlipidemia: Secondary | ICD-10-CM

## 2016-03-03 DIAGNOSIS — I2510 Atherosclerotic heart disease of native coronary artery without angina pectoris: Secondary | ICD-10-CM

## 2016-03-03 LAB — LIPID PANEL
CHOL/HDL RATIO: 3 ratio (ref 0.0–5.0)
Cholesterol, Total: 118 mg/dL (ref 100–199)
HDL: 40 mg/dL (ref >39)
LDL CALC: 57 mg/dL (ref 0–99)
TRIGLYCERIDES: 107 mg/dL (ref 0–149)
VLDL Cholesterol Cal: 21 mg/dL (ref 5–40)

## 2016-03-03 LAB — HEPATIC FUNCTION PANEL
ALBUMIN: 4.5 g/dL (ref 3.5–5.5)
ALT: 28 IU/L (ref 0–44)
AST: 24 IU/L (ref 0–40)
Alkaline Phosphatase: 141 IU/L — ABNORMAL HIGH (ref 39–117)
BILIRUBIN TOTAL: 1 mg/dL (ref 0.0–1.2)
BILIRUBIN, DIRECT: 0.28 mg/dL (ref 0.00–0.40)
Total Protein: 7.4 g/dL (ref 6.0–8.5)

## 2016-03-03 MED ORDER — METOPROLOL TARTRATE 25 MG PO TABS: 25.0000 mg | ORAL_TABLET | Freq: Two times a day (BID) | ORAL | 3 refills | Status: DC

## 2016-03-03 MED ORDER — ATORVASTATIN CALCIUM 80 MG PO TABS: 80.0000 mg | ORAL_TABLET | Freq: Every day | ORAL | 3 refills | Status: DC

## 2016-03-03 MED ORDER — CLOPIDOGREL BISULFATE 75 MG PO TABS: 75.0000 mg | ORAL_TABLET | Freq: Every day | ORAL | 3 refills | Status: DC

## 2016-03-03 NOTE — Addendum Note (Signed)
Addended by: Gunnar FusiKEMP, Lauralyn Shadowens A on: 03/03/2016 11:42 AM   Modules accepted: Orders

## 2016-03-03 NOTE — Patient Instructions (Signed)
Medication Instructions:  Your physician recommends that you continue on your current medications as directed. Please refer to the Current Medication list given to you today.   Labwork: TODAY: Lipids, liver  Testing/Procedures: None  Follow-Up: Your physician wants you to follow-up in: 1 year with Dr. Mayford Knifeurner. You will receive a reminder letter in the mail two months in advance. If you don't receive a letter, please call our office to schedule the follow-up appointment.   Any Other Special Instructions Will Be Listed Below (If Applicable).     If you need a refill on your cardiac medications before your next appointment, please call your pharmacy.

## 2016-03-03 NOTE — Progress Notes (Signed)
Cardiology Office Note    Date:  03/03/2016   ID:  Wayne AppHenry B Jordan, DOB 06/17/1960, MRN [id number]  PCP:  Sissy HoffSWAYNE,[name] W, MD  Cardiologist:  Armanda Magicraci Sahan Pen, MD   Chief Complaint  Patient presents with  . Coronary Artery Disease  . Hyperlipidemia    History of Present Illness:  Wayne Jordan is a 56 y.o. male with a history of ASCAD s/p NSTEMI 09/2012 with cath showing a proximal 80-90% sequential LAD stenosis with luminal irregularities in the LCx and RCA with normal LVF. He underwent DES to prox LAD and was initially  on ASA and Brilinta. He is doing well. He denies any chest pain, LE edema, dizziness, palpitations or syncope. He plays volleyball for exercise.  Past Medical History:  Diagnosis Date  . 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 Coronary artery disease    s/P PCI of the LAD with DES.  Nuclear stress test 04/2014 with no ischemia  . Hypercholesteremia   . Hyperlipidemia   . Personal history of urinary calculi   . 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     Past Surgical History:  Procedure Laterality Date  . INGUINAL HERNIA REPAIR Right 03/08/2012   Procedure: HERNIA REPAIR INGUINAL ADULT;  Surgeon: Axel FillerArmando Ramirez, MD;  [facility];  Service: General;  Laterality: Right;  . INSERTION OF MESH N/A 03/08/2012   Procedure: INSERTION OF MESH;  Surgeon: Axel FillerArmando Ramirez, MD;  Location: [facility] OR;  Service: General;  Laterality: N/A;  . LEFT HEART CATHETERIZATION WITH CORONARY ANGIOGRAM Right 10/07/2012   Procedure: LEFT HEART CATHETERIZATION WITH CORONARY ANGIOGRAM;  Surgeon: Corky CraftsJayadeep S Varanasi, MD;  [facility];  Service: Cardiovascular;  Laterality: Right;  . PERCUTANEOUS STENT INTERVENTION  10/07/2012   Procedure: PERCUTANEOUS STENT INTERVENTION;  Surgeon: Corky CraftsJayadeep S Varanasi, MD;  [facility];  Service: Cardiovascular;;    Current Medications: Outpatient Medications Prior to Visit  Medication Sig Dispense Refill  .  ascorbic acid (VITAMIN C) 500 MG tablet Take 500 mg by mouth daily.    Marland Kitchen. aspirin 81 MG chewable tablet Chew 81 mg by mouth daily.    Marland Kitchen. atorvastatin (LIPITOR) 80 MG tablet Take 1 tablet (80 mg total) by mouth daily. 90 tablet 3  . clopidogrel (PLAVIX) 75 MG tablet Take 1 tablet (75 mg total) by mouth daily. *Please keep upcoming appointment for further refills* 30 tablet 0  . metoprolol tartrate (LOPRESSOR) 25 MG tablet Take 1 tablet (25 mg total) by mouth 2 (two) times daily. *Please keep upcoming appointment for further refills* 60 tablet 0  . nitroGLYCERIN (NITROSTAT) 0.4 MG SL tablet Place 1 tablet (0.4 mg total) under the tongue every 5 (five) minutes x 3 doses as needed for chest pain. 25 tablet 3  . ketoconazole (NIZORAL) 2 % cream Apply 1 application topically 2 (two) times daily. (Patient not taking: Reported on 03/03/2016) 60 g 4   No facility-administered medications prior to visit.      Allergies:   Coconut fatty acids and Penicillins   Social History   Social History  . Marital status: Married    Spouse name: N/A  . Number of children: N/A  . Years of education: N/A   Social History Main Topics  . Smoking status: Former Smoker    Packs/day: 1.50    Years: 7.00    Types: Cigarettes    Quit date: 10/01/2012  . Smokeless tobacco: Never Used  . Alcohol use Yes  .  Drug use: No  . Sexual activity: Not Asked   Other Topics Concern  . None   Social History Narrative   Drinks 2 cheerwines daily.     Family History:  The patient's family history includes CAD in his father; Heart attack in his father; Heart disease in his father.   ROS:   Please see the history of present illness.    ROS All other systems reviewed and are negative.  No flowsheet data found.     PHYSICAL EXAM:   VS:  BP 116/80   Pulse 96   Ht 5\' 11"  (1.803 m)   Wt 258 lb 12.8 oz (117.4 kg)   BMI 36.10 kg/m    GEN: Well nourished, well developed, in no acute distress  HEENT: normal  Neck: no  JVD, carotid bruits, or masses Cardiac: RRR; no murmurs, rubs, or gallops,no edema.  Intact distal pulses bilaterally.  Respiratory:  clear to auscultation bilaterally, normal work of breathing GI: soft, nontender, nondistended, + BS MS: no deformity or atrophy  Skin: warm and dry, no rash Neuro:  Alert and Oriented x 3, Strength and sensation are intact Psych: euthymic mood, full affect  Wt Readings from Last 3 Encounters:  03/03/16 258 lb 12.8 oz (117.4 kg)  10/14/15 266 lb (120.7 kg)  05/26/15 263 lb (119.3 kg)      Studies/Labs Reviewed:   EKG:  EKG is not ordered today.   Recent Labs: 04/11/2015: Hemoglobin 15.1 04/18/2015: ALT 22   Lipid Panel    Component Value Date/Time   CHOL 103 (L) 04/18/2015 0930   CHOL CANCELED 11/07/2014 0921   CHOL SEE BELOW 11/07/2014 0921   CHOL 128 04/26/2014 0757   TRIG 112 04/18/2015 0930   TRIG CANCELED 11/07/2014 0921   TRIG SEE BELOW 11/07/2014 0921   TRIG 140 04/26/2014 0757   HDL 40 04/18/2015 0930   HDL CANCELED 11/07/2014 0921   HDL SEE BELOW 11/07/2014 0921   HDL 39 (L) 04/26/2014 0757   CHOLHDL 2.6 04/18/2015 0930   VLDL 22 04/18/2015 0930   LDLCALC 41 04/18/2015 0930   LDLCALC CANCELED 11/07/2014 0921   LDLCALC SEE BELOW 11/07/2014 0921   LDLCALC 61 04/26/2014 0757    Additional studies/ records that were reviewed today include:  none    ASSESSMENT:    1. Coronary artery disease involving native coronary artery of native heart without angina pectoris   2. Mixed hyperlipidemia      PLAN:  In order of problems listed above:  1. ASCAD  s/p NSTEMI 09/2012 with cath showing a proximal 80-90% sequential LAD stenosis with luminal irregularities in the LCx and RCA s/p DES to prox LAD. He is doing well with no complaints of chest pain.  Nuclear stress test was normal 04/2014. He will continue on ASA/Plavix/BB and statin.    2. Hyperlipidemia - LDL goal < 70. He will continue on statin. Check FLP and  ALT.    Medication Adjustments/Labs and Tests Ordered: Current medicines are reviewed at length with the patient today.  Concerns regarding medicines are outlined above.  Medication changes, Labs and Tests ordered today are listed in the Patient Instructions below.  There are no Patient Instructions on file for this visit.   Signed, Armanda Magic, MD  03/03/2016 11:04 AM    [facility] [address] [place], [place], [place]  [address] Phone: [phone]; Fax: [phone]

## 2016-03-05 ENCOUNTER — Telehealth: Payer: Self-pay | Admitting: Cardiology

## 2016-03-05 NOTE — Telephone Encounter (Signed)
New message ° ° ° °Pt is returning call about his labs. °

## 2016-03-15 ENCOUNTER — Other Ambulatory Visit: Payer: Self-pay | Admitting: Cardiology

## 2016-03-15 NOTE — Telephone Encounter (Signed)
clopidogrel (PLAVIX) 75 MG tablet  Medication  Date: 03/03/2016 Department: [facility] [address] Office Ordering/Authorizing: Quintella Reichertraci R Turner, MD  Order Providers   Prescribing Provider Encounter Provider  Quintella Reichertraci R Turner, MD Quintella Reichertraci R Turner, MD  Medication Detail    Disp Refills Start End   clopidogrel (PLAVIX) 75 MG tablet 90 tablet 3 03/03/2016 02/26/2017   Sig - Route: Take 1 tablet (75 mg total) by mouth daily. - Oral   E-Prescribing Status: Receipt confirmed by pharmacy (03/03/2016 11:15 AM EST)   Pharmacy   [facility] [place] [place] - [address] AT PORTAL TO [address]   metoprolol tartrate (LOPRESSOR) 25 MG tablet  Medication  Date: 03/03/2016 Department[facility] [address] Office Ordering/Authorizing: Quintella Reichertraci R Turner, MD  Order Providers   Prescribing Provider Encounter Provider  Quintella Reichertraci R Turner, MD Quintella Reichertraci R Turner, MD  Medication Detail    Disp Refills Start End   metoprolol tartrate (LOPRESSOR) 25 MG tablet 180 tablet 3 03/03/2016 02/26/2017   Sig - Route: Take 1 tablet (25 mg total) by mouth 2 (two) times daily. - Oral   E-Prescribing Status: Receipt confirmed by pharmacy (03/03/2016 11:15 AM EST)   Pharmacy   [facility] [place] [place] - [address] AT [address] TO [address]

## 2016-04-05 ENCOUNTER — Telehealth: Payer: Self-pay | Admitting: Cardiology

## 2016-04-05 NOTE — Telephone Encounter (Signed)
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 Placed in [facility] Box.

## 2016-04-07 ENCOUNTER — Telehealth: Payer: Self-pay

## 2016-04-07 NOTE — Telephone Encounter (Signed)
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  Per Dr. Mayford Knifeurner, patient will need a myoview prior to sending letter as patient has not had one in 2 years.  Left message to call back to review instructions and schedule test.

## 2016-04-12 NOTE — Telephone Encounter (Signed)
Left message to call back  

## 2016-05-05 NOTE — Telephone Encounter (Signed)
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  He states he will call to review instructions and schedule test when he is ready - it will probably be later this summer. He was grateful for follow-up.

## 2016-05-25 ENCOUNTER — Ambulatory Visit: Payer: 59 | Admitting: Adult Health

## 2016-07-04 ENCOUNTER — Encounter: Payer: Self-pay | Admitting: Adult Health

## 2016-07-05 ENCOUNTER — Encounter: Payer: Self-pay | Admitting: Adult Health

## 2016-07-05 ENCOUNTER — Ambulatory Visit (INDEPENDENT_AMBULATORY_CARE_PROVIDER_SITE_OTHER): Payer: BC Managed Care – PPO | Admitting: Adult Health

## 2016-07-05 VITALS — BP 130/86 | HR 65 | Wt 270.8 lb

## 2016-07-05 DIAGNOSIS — G4733 Obstructive sleep apnea (adult) (pediatric): Secondary | ICD-10-CM

## 2016-07-05 DIAGNOSIS — Z9989 Dependence on other enabling machines and devices: Secondary | ICD-10-CM

## 2016-07-05 DIAGNOSIS — H5203 Hypermetropia, bilateral: Secondary | ICD-10-CM | POA: Diagnosis not present

## 2016-07-05 NOTE — Patient Instructions (Signed)
Continue using CPAP nightly >4 hours every night If your symptoms worsen or you develop new symptoms please let us know.

## 2016-07-05 NOTE — Progress Notes (Addendum)
PATIENT: Wayne Jordan DOB: 04/03/1960  REASON FOR VISIT: follow up- OSA on CPAP HISTORY FROM: patient  HISTORY OF PRESENT ILLNESS: 07/05/16 Mr. Austad is a 56 year old male with a history of obstructive sleep apnea on CPAP. He returns today for a compliance download. His download indicates that he uses machine 5 out of 30 days for compliance of 16.7%. He uses machine greater than 4 hours for compliance of 6.7%. His average use is 3 hours and 59 minutes. His residual AHI is 2.5 on 13 cm water. The patient states that he has not changed his supplies in over a year. He states he typically takes the mask off because it moves and causes a leak. In the past the patient has had good compliance. He returns today for an evaluation.   HISTORY 05/26/15 Copied from Dr. Oliva Bustard notes: ZARON ZWIEFELHOFER is a 56 y.o. male , seen here as a referral  from Dr. Neva Seat  for a sleep evaluation,  He has been snoring since childhood, a has been obese for  many years and Dr Neva Seat  reported he suffered a myocardial infarction in 2014 at the age of 76.  He considers himself a " big snorer". He doesn't remember gasping for breath or waking up air hungry. 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 He is married with 5 children, his wife works in a Industrial/product designer. His last heart cath wa sin 10-2014, and " went well "   Sleep habits are as follows: he goes to bed between 11 PM and midnight, and usually falls asleep promptly. His bedroom is cold, quiet and dark. He is restless, moving all the time.  He sleeps so fast , his wife has no opportunity to speak to him after entering the bedroom. Usually he sleeps until 4 -5 AM , has one bathroom break at that time and goes to back to sleep and rises at 6 AM - when his wife wakes him. That's on days he is not on the road. He has to drive throughout in the [facility]. He usually can sleep in a hotel, he always makes sure to get 6-7 hours of  rest. He doesn't consider himself sleepy in daytime. His wife noted some irregular breathing , crescendo snoring. If he naps , he naps for several hours and thus avoids naps. He feels grumpy.   Sleep medical history and family sleep history: No ENT, nasal or neck surgeries. Father was a loud snorer. The patient has 3 brothers , all snore.  Social history: former smoker, quit in 2001 - ETOH: 2 drinks since 1991, Caffeine : 2-3  cheer wine a day.    I had the pleasure of seeing Mr. Creswell today in a revisit from 05/26/2015.  Mr. Feutz underwent a sleep study on 02/20/2015 upon referral by Dr. Meredith Staggers, he had a mild sleep apnea with an AHI of 12.1, REM AHI 21.1 and supine AHI 23.9. He did have prolonged hypoxemia at 85 minutes of the total night of sleep were considered in hypoxemia with a nadir of 77%. For this reason CPAP was recommended and not a dental device or ENT procedure. He'll return for CPAP titration on February 28 and did extremely well. 13 cm water pressure was determined as the one that gave him the relief with an AHI of 0.0 and the oxygen nadir rose to 89%  He stated today that he needed about  a week only to get use to use CPAP. He has a 96.7% excellent compliance with CPAP at home over the last 30 days the average AHI is also very good at 2.4 CPAP is used at 13 cm water with an average user time of 5 hours and 2 minutes at night the patient drives some nights for his work-related requirements and he will use the CPAP than in daytime. A pulse oximetry was performed after he had started the CPAP to confirm that she no longer suffers from hypoxia. His wake time oxygen level average is 94%, the time was lower than 89% of oxygen was reduced to 9.9 minutes. There is no need for additional oxygen supplementing CPAP.   REVIEW OF SYSTEMS: Out of a complete 14 system review of symptoms, the patient complains only of the following symptoms, and all other reviewed systems are  negative.  See history of present illness  ALLERGIES: Allergies  Allergen Reactions  . Coconut Fatty Acids Shortness Of Breath and Swelling  . Penicillins Hives and Rash    HOME MEDICATIONS: Outpatient Medications Prior to Visit  Medication Sig Dispense Refill  . ascorbic acid (VITAMIN C) 500 MG tablet Take 500 mg by mouth daily.    Marland Kitchen aspirin 81 MG chewable tablet Chew 81 mg by mouth daily.    Marland Kitchen atorvastatin (LIPITOR) 80 MG tablet Take 1 tablet (80 mg total) by mouth daily. 90 tablet 3  . clopidogrel (PLAVIX) 75 MG tablet Take 1 tablet (75 mg total) by mouth daily. 90 tablet 3  . metoprolol tartrate (LOPRESSOR) 25 MG tablet Take 1 tablet (25 mg total) by mouth 2 (two) times daily. 180 tablet 3  . nitroGLYCERIN (NITROSTAT) 0.4 MG SL tablet Place 1 tablet (0.4 mg total) under the tongue every 5 (five) minutes x 3 doses as needed for chest pain. 25 tablet 3   No facility-administered medications prior to visit.     PAST MEDICAL HISTORY: Past Medical History:  Diagnosis Date  . 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 Coronary artery disease    s/P PCI of the LAD with DES.  Nuclear stress test 04/2014 with no ischemia  . Hypercholesteremia   . Hyperlipidemia   . Personal history of urinary calculi   . 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     PAST SURGICAL HISTORY: Past Surgical History:  Procedure Laterality Date  . INGUINAL HERNIA REPAIR Right 03/08/2012   Procedure: HERNIA REPAIR INGUINAL ADULT;  Surgeon: Axel Filler, MD;  Location: [facility] OR;  Service: General;  Laterality: Right;  . INSERTION OF MESH N/A 03/08/2012   Procedure: INSERTION OF MESH;  Surgeon: Axel Filler, MD;  Location: [facility] OR;  Service: General;  Laterality: N/A;  . LEFT HEART CATHETERIZATION WITH CORONARY ANGIOGRAM Right 10/07/2012   Procedure: LEFT HEART CATHETERIZATION WITH CORONARY ANGIOGRAM;  Surgeon: Corky Crafts, MD;  Location: [facility] CATH LAB;  Service: Cardiovascular;   Laterality: Right;  . PERCUTANEOUS STENT INTERVENTION  10/07/2012   Procedure: PERCUTANEOUS STENT INTERVENTION;  Surgeon: Corky Crafts, MD;  Location: [facility] CATH LAB;  Service: Cardiovascular;;    FAMILY HISTORY: Family History  Problem Relation Age of Onset  . Heart disease Father   . Heart attack Father   . CAD Father     SOCIAL HISTORY: Social History   Social History  . Marital status: Married    Spouse name: N/A  . Number of children: N/A  . Years of education: N/A  Occupational History  . Not on file.   Social History Main Topics  . Smoking status: Former Smoker    Packs/day: 1.50    Years: 7.00    Types: Cigarettes    Quit date: 10/01/2012  . Smokeless tobacco: Never Used  . Alcohol use Yes  . Drug use: No  . Sexual activity: Not on file   Other Topics Concern  . Not on file   Social History Narrative   Drinks 2 cheerwines daily.      PHYSICAL EXAM  Vitals:   07/05/16 0802  BP: 130/86  Pulse: 65  Weight: 270 lb 12.8 oz (122.8 kg)   Body mass index is 37.77 kg/m.  Generalized: Well developed, in no acute distress   Neurological examination  Mentation: Alert oriented to time, place, history taking. Follows all commands speech and language fluent Cranial nerve II-XII: Pupils were equal round reactive to light. Extraocular movements were full, visual field were full on confrontational test. Facial sensation and strength were normal. Uvula tongue midline. Head turning and shoulder shrug  were normal and symmetric.Mallampati 3+ Motor: The motor testing reveals 5 over 5 strength of all 4 extremities. Good symmetric motor tone is noted throughout.  Sensory: Sensory testing is intact to soft touch on all 4 extremities. No evidence of extinction is noted.  Coordination: Cerebellar testing reveals good finger-nose-finger and heel-to-shin bilaterally.  Gait and station: Gait is normal.   DIAGNOSTIC DATA (LABS, IMAGING, TESTING) - I reviewed patient  records, labs, notes, testing and imaging myself where available.  Lab Results  Component Value Date   WBC 8.1 04/11/2015   HGB 15.1 04/11/2015   HCT 43.5 04/11/2015   MCV 88.5 04/11/2015   PLT 234 03/28/2013      Component Value Date/Time   NA 141 03/28/2013 0626   K 4.4 03/28/2013 0626   CL 106 03/28/2013 0626   CO2 21 03/28/2013 0626   GLUCOSE 126 (H) 03/28/2013 0626   BUN 12 03/28/2013 0626   CREATININE 0.97 03/28/2013 0626   CALCIUM 9.5 03/28/2013 0626   PROT 7.4 03/03/2016 1144   ALBUMIN 4.5 03/03/2016 1144   AST 24 03/03/2016 1144   ALT 28 03/03/2016 1144   ALKPHOS 141 (H) 03/03/2016 1144   BILITOT 1.0 03/03/2016 1144   GFRNONAA >90 03/28/2013 0626   GFRAA >90 03/28/2013 0626   Lab Results  Component Value Date   CHOL 118 03/03/2016   HDL 40 03/03/2016   LDLCALC 57 03/03/2016   TRIG 107 03/03/2016   CHOLHDL 3.0 03/03/2016   Lab Results  Component Value Date   HGBA1C 5.6 10/07/2012      ASSESSMENT AND PLAN 56 y.o. year old male  has a past medical history of Acute myocardial infarction of other anterior wall, initial episode of care; Coronary artery disease; Hypercholesteremia; Hyperlipidemia; Personal history of urinary calculi; and 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. here with:  1. Obstructive sleep apnea on CPAP  The patient's compliance has decreased since his last visit 1 year ago. I will send a prescription to his DME company asking that they send him new supplies. Hopefully with new supplies he will wear the machine longer and each night. I discussed with the patient the risk associated with untreated sleep apnea. He voices understanding. He will follow-up in one year with Dr. Vickey Hugerohmeier.  I spent 15 minutes with the patient. 50% of this time was spent reviewing the patient's CPAP download as well as risk associated with untreated sleep apnea  Butch Penny, MSN, NP-C 07/05/2016, 8:03 AM [facility] [address], [address] [place], [place] [address] [phone]  I reviewed the above note and documentation by the Nurse Practitioner and agree with the history, physical exam, assessment and plan as outlined above. I was immediately available for face-to-face consultation. Huston Foley, MD, PhD [facility])

## 2017-01-25 ENCOUNTER — Telehealth: Payer: Self-pay | Admitting: Cardiology

## 2017-01-25 DIAGNOSIS — I2510 Atherosclerotic heart disease of native coronary artery without angina pectoris: Secondary | ICD-10-CM

## 2017-01-25 NOTE — Telephone Encounter (Signed)
New message    Patient calling for order to get stress test prior to scheduling yearly appt. No order in Epic  Please advise.

## 2017-01-25 NOTE — Telephone Encounter (Signed)
Per phone note from 03/2016 patient needs stress test prior to visit with Dr. Mayford Knifeurner to sign [employer] license. Patient is calling in to schedule stress. Order placed and sent to schedulers to make appt.

## 2017-02-03 ENCOUNTER — Telehealth (HOSPITAL_COMMUNITY): Payer: Self-pay | Admitting: *Deleted

## 2017-02-03 NOTE — Telephone Encounter (Signed)
Patient given detailed instructions per Myocardial Perfusion Study Information Sheet for the test on 02/08/17. Patient notified to arrive 15 minutes early and that it is imperative to arrive on time for appointment to keep from having the test rescheduled.  If you need to cancel or reschedule your appointment, please call the office within 24 hours of your appointment. . Patient verbalized understanding. Wayne Jordan    

## 2017-02-08 ENCOUNTER — Ambulatory Visit (HOSPITAL_COMMUNITY): Payer: BC Managed Care – PPO | Attending: Cardiovascular Disease

## 2017-02-08 ENCOUNTER — Encounter (INDEPENDENT_AMBULATORY_CARE_PROVIDER_SITE_OTHER): Payer: Self-pay

## 2017-02-08 DIAGNOSIS — I2510 Atherosclerotic heart disease of native coronary artery without angina pectoris: Secondary | ICD-10-CM | POA: Diagnosis present

## 2017-02-08 LAB — MYOCARDIAL PERFUSION IMAGING
CHL CUP NUCLEAR SSS: 6
CSEPPHR: 103 {beats}/min
LHR: 0.4
LVDIAVOL: 114 mL (ref 62–150)
LVSYSVOL: 44 mL
Rest HR: 66 {beats}/min
SDS: 4
SRS: 2
TID: 0.98

## 2017-02-08 MED ORDER — TECHNETIUM TC 99M TETROFOSMIN IV KIT
10.2000 | PACK | Freq: Once | INTRAVENOUS | Status: AC | PRN
  Administered 2017-02-08: 10.2 via INTRAVENOUS
  Filled 2017-02-08: qty 11

## 2017-02-08 MED ORDER — TECHNETIUM TC 99M TETROFOSMIN IV KIT
32.9000 | PACK | Freq: Once | INTRAVENOUS | Status: AC | PRN
  Administered 2017-02-08: 32.9 via INTRAVENOUS
  Filled 2017-02-08: qty 33

## 2017-02-08 MED ORDER — REGADENOSON 0.4 MG/5ML IV SOLN
0.4000 mg | Freq: Once | INTRAVENOUS | Status: AC
  Administered 2017-02-08: 0.4 mg via INTRAVENOUS

## 2017-02-14 ENCOUNTER — Ambulatory Visit: Payer: BC Managed Care – PPO | Admitting: Cardiology

## 2017-02-14 ENCOUNTER — Encounter: Payer: Self-pay | Admitting: Cardiology

## 2017-02-14 VITALS — BP 120/80 | HR 76 | Ht 71.0 in | Wt 269.8 lb

## 2017-02-14 DIAGNOSIS — I2510 Atherosclerotic heart disease of native coronary artery without angina pectoris: Secondary | ICD-10-CM

## 2017-02-14 DIAGNOSIS — E782 Mixed hyperlipidemia: Secondary | ICD-10-CM | POA: Diagnosis not present

## 2017-02-14 MED ORDER — CLOPIDOGREL BISULFATE 75 MG PO TABS: 75.0000 mg | ORAL_TABLET | Freq: Every day | ORAL | 3 refills | Status: DC

## 2017-02-14 MED ORDER — METOPROLOL TARTRATE 25 MG PO TABS: 25.0000 mg | ORAL_TABLET | Freq: Two times a day (BID) | ORAL | 3 refills | Status: DC

## 2017-02-14 MED ORDER — ATORVASTATIN CALCIUM 80 MG PO TABS: 80.0000 mg | ORAL_TABLET | Freq: Every day | ORAL | 3 refills | Status: DC

## 2017-02-14 NOTE — Patient Instructions (Signed)
Medication Instructions:  Your physician recommends that you continue on your current medications as directed. Please refer to the Current Medication list given to you today.  Labwork: Your physician recommends that you return for lab work in: 1 week for fasting lipids and liver function    Testing/Procedures: None ordered   Follow-Up: Your physician wants you to follow-up in: 1 year with Dr. Mayford Knifeurner. You will receive a reminder letter in the mail two months in advance. If you don't receive a letter, please call our office to schedule the follow-up appointment.  Any Other Special Instructions Will Be Listed Below (If Applicable).     If you need a refill on your cardiac medications before your next appointment, please call your pharmacy.

## 2017-02-14 NOTE — Progress Notes (Signed)
Cardiology Office Note:    Date:  02/14/2017   ID:  [name], DOB 06/25/1960, MRN [id number]  PCP:  Tally Joe, MD  Cardiologist:  No primary care provider on file.    Referring MD: Tally Joe, MD   Chief Complaint  Patient presents with  . Coronary Artery Disease  . Hyperlipidemia    History of Present Illness:    Wayne Jordan is a 57 y.o. male with a hx of ASCAD s/p NSTEMI 09/2012 with cath showing a proximal 80-90% sequential LAD stenosis with luminal irregularities in the LCx and RCA with normal LVF. He underwent DES to prox LAD and was initially on ASA and Brilinta but has been transitioned to Plavix.  He is here today for followup and is doing well.  He denies any chest pain or pressure, SOB, DOE, PND, orthopnea, LE edema, dizziness, palpitations or syncope. He is compliant with his meds and is tolerating meds with no SE.    Past Medical History:  Diagnosis Date  . 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 Coronary artery disease    s/P PCI of the LAD with DES.  Nuclear stress test 04/2014 with no ischemia  . Hypercholesteremia   . Hyperlipidemia   . OSA on CPAP    followed by Dr. Jessie Foot  . Personal history of urinary calculi   . 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     Past Surgical History:  Procedure Laterality Date  . INGUINAL HERNIA REPAIR Right 03/08/2012   Procedure: HERNIA REPAIR INGUINAL ADULT;  Surgeon: Axel Filler, MD;  Location: [facility] OR;  Service: General;  Laterality: Right;  . INSERTION OF MESH N/A 03/08/2012   Procedure: INSERTION OF MESH;  Surgeon: Axel Filler, MD;  Location: [facility] OR;  Service: General;  Laterality: N/A;  . LEFT HEART CATHETERIZATION WITH CORONARY ANGIOGRAM Right 10/07/2012   Procedure: LEFT HEART CATHETERIZATION WITH CORONARY ANGIOGRAM;  Surgeon: Corky Crafts, MD;  Location[facility] CATH LAB;  Service: Cardiovascular;  Laterality: Right;  . PERCUTANEOUS STENT INTERVENTION  10/07/2012   Procedure: PERCUTANEOUS STENT INTERVENTION;  Surgeon: Corky Crafts, MD;  Location: [facility] CATH LAB;  Service: Cardiovascular;;    Current Medications: Current Meds  Medication Sig  . ascorbic acid (VITAMIN C) 500 MG tablet Take 500 mg by mouth daily.  Marland Kitchen aspirin 81 MG chewable tablet Chew 81 mg by mouth daily.  Marland Kitchen atorvastatin (LIPITOR) 80 MG tablet Take 1 tablet (80 mg total) by mouth daily.  . clopidogrel (PLAVIX) 75 MG tablet Take 1 tablet (75 mg total) by mouth daily.  . metoprolol tartrate (LOPRESSOR) 25 MG tablet Take 1 tablet (25 mg total) by mouth 2 (two) times daily.  . nitroGLYCERIN (NITROSTAT) 0.4 MG SL tablet Place 1 tablet (0.4 mg total) under the tongue every 5 (five) minutes x 3 doses as needed for chest pain.     Allergies:   Coconut fatty acids and Penicillins   Social History   Socioeconomic History  . Marital status: Married    Spouse name: None  . Number of children: None  . Years of education: None  . Highest education level: None  Social Needs  . Financial resource strain: None  . Food insecurity - worry: None  . Food insecurity - inability: None  . Transportation needs - medical: None  . Transportation needs - non-medical: None  Occupational History  . None  Tobacco Use  . Smoking status: Former Smoker  Packs/day: 1.50    Years: 7.00    Pack years: 10.50    Types: Cigarettes    Last attempt to quit: 10/01/2012    Years since quitting: 4.3  . Smokeless tobacco: Never Used  Substance and Sexual Activity  . Alcohol use: Yes  . Drug use: No  . Sexual activity: None  Other Topics Concern  . None  Social History Narrative   Drinks 2 cheerwines daily.     Family History: The patient's family history includes CAD in his father; Heart attack in his father; Heart disease in his father.  ROS:   Please see the history of present illness.    ROS  All other systems reviewed and negative.   EKGs/Labs/Other Studies Reviewed:    The following  studies were reviewed today: none  EKG:  EKG is not ordered today.    Recent Labs: 03/03/2016: ALT 28   Recent Lipid Panel    Component Value Date/Time   CHOL 118 03/03/2016 1144   CHOL CANCELED 11/07/2014 0921   CHOL SEE BELOW 11/07/2014 0921   CHOL 128 04/26/2014 0757   TRIG 107 03/03/2016 1144   TRIG CANCELED 11/07/2014 0921   TRIG SEE BELOW 11/07/2014 0921   TRIG 140 04/26/2014 0757   HDL 40 03/03/2016 1144   HDL CANCELED 11/07/2014 0921   HDL SEE BELOW 11/07/2014 0921   HDL 39 (L) 04/26/2014 0757   CHOLHDL 3.0 03/03/2016 1144   CHOLHDL 2.6 04/18/2015 0930   VLDL 22 04/18/2015 0930   LDLCALC 57 03/03/2016 1144   LDLCALC CANCELED 11/07/2014 0921   LDLCALC SEE BELOW 11/07/2014 0921   LDLCALC 61 04/26/2014 0757    Physical Exam:    VS:  BP 120/80   Pulse 76   Ht 5\' 11"  (1.803 m)   Wt 269 lb 12.8 oz (122.4 kg)   BMI 37.63 kg/m     Wt Readings from Last 3 Encounters:  02/14/17 269 lb 12.8 oz (122.4 kg)  02/08/17 258 lb (117 kg)  07/05/16 270 lb 12.8 oz (122.8 kg)     GEN:  Well nourished, well developed in no acute distress HEENT: Normal NECK: No JVD; No carotid bruits LYMPHATICS: No lymphadenopathy CARDIAC: RRR, no murmurs, rubs, gallops RESPIRATORY:  Clear to auscultation without rales, wheezing or rhonchi  ABDOMEN: Soft, non-tender, non-distended MUSCULOSKELETAL:  No edema; No deformity  SKIN: Warm and dry NEUROLOGIC:  Alert and oriented x 3 PSYCHIATRIC:  Normal affect   ASSESSMENT:    1. Coronary artery disease involving native coronary artery of native heart without angina pectoris   2. Mixed hyperlipidemia    PLAN:    In order of problems listed above:  1.  ASCAD - He will continue on ASA 81mg  daily, Plavix 75mg  daily, Lopressor 25mg  BID and statin.   2.  Hyperlipidemia with LDL goal < 70.  He will continue on Lipitor 80mg  daily.  I will get an FLP and ALT.    Medication Adjustments/Labs and Tests Ordered: Current medicines are reviewed at  length with the patient today.  Concerns regarding medicines are outlined above.  No orders of the defined types were placed in this encounter.  No orders of the defined types were placed in this encounter.   Signed, Wayne Magicraci Dashanna Kinnamon, MD  02/14/2017 2:43 PM    [facility]

## 2017-02-21 ENCOUNTER — Other Ambulatory Visit: Payer: BC Managed Care – PPO | Admitting: *Deleted

## 2017-02-21 DIAGNOSIS — E782 Mixed hyperlipidemia: Secondary | ICD-10-CM

## 2017-02-21 LAB — HEPATIC FUNCTION PANEL
ALK PHOS: 125 IU/L — AB (ref 39–117)
ALT: 26 IU/L (ref 0–44)
AST: 21 IU/L (ref 0–40)
Albumin: 4.3 g/dL (ref 3.5–5.5)
BILIRUBIN TOTAL: 0.6 mg/dL (ref 0.0–1.2)
BILIRUBIN, DIRECT: 0.19 mg/dL (ref 0.00–0.40)
Total Protein: 6.6 g/dL (ref 6.0–8.5)

## 2017-02-21 LAB — LIPID PANEL
CHOL/HDL RATIO: 2.2 ratio (ref 0.0–5.0)
Cholesterol, Total: 96 mg/dL — ABNORMAL LOW (ref 100–199)
HDL: 43 mg/dL (ref >39)
LDL Calculated: 30 mg/dL (ref 0–99)
Triglycerides: 113 mg/dL (ref 0–149)
VLDL Cholesterol Cal: 23 mg/dL (ref 5–40)

## 2017-02-22 ENCOUNTER — Telehealth: Payer: Self-pay

## 2017-02-22 NOTE — Telephone Encounter (Signed)
Spoke with patient and per Dr. Radford Pax lipids at goal continue current therapy. Forwarded labs to PCP to review elevated alk phos. Patient verbalized understanding.

## 2017-02-22 NOTE — Telephone Encounter (Signed)
-----   Message from Donnisha Robertson, RN sent at 02/22/2017  8:15 AM EST ----- To CMA pool 

## 2017-02-22 NOTE — Telephone Encounter (Signed)
Left message to call back for recent lab results.  

## 2017-02-22 NOTE — Telephone Encounter (Signed)
-----   Message from Phineas Semenonnisha Robertson, RN sent at 02/22/2017  8:15 AM EST ----- To CMA pool

## 2017-03-22 ENCOUNTER — Ambulatory Visit (INDEPENDENT_AMBULATORY_CARE_PROVIDER_SITE_OTHER): Payer: BC Managed Care – PPO | Admitting: Family Medicine

## 2017-03-22 DIAGNOSIS — Z23 Encounter for immunization: Secondary | ICD-10-CM

## 2017-03-22 NOTE — Progress Notes (Signed)
Flu shot

## 2017-03-30 ENCOUNTER — Encounter: Payer: Self-pay | Admitting: Neurology

## 2017-06-13 ENCOUNTER — Encounter: Payer: Self-pay | Admitting: Family Medicine

## 2017-07-05 ENCOUNTER — Telehealth: Payer: Self-pay | Admitting: Neurology

## 2017-07-05 ENCOUNTER — Ambulatory Visit: Payer: BC Managed Care – PPO | Admitting: Neurology

## 2017-07-05 ENCOUNTER — Encounter: Payer: Self-pay | Admitting: Neurology

## 2017-07-05 VITALS — BP 112/82 | HR 66 | Ht 71.0 in | Wt 269.5 lb

## 2017-07-05 DIAGNOSIS — G4733 Obstructive sleep apnea (adult) (pediatric): Secondary | ICD-10-CM

## 2017-07-05 DIAGNOSIS — Z9989 Dependence on other enabling machines and devices: Secondary | ICD-10-CM | POA: Diagnosis not present

## 2017-07-05 NOTE — Progress Notes (Addendum)
PATIENT: Wayne Jordan DOB: 08/24/1960                                                                    SLEEP MEDICINE CLINIC  REASON FOR VISIT: follow up- OSA on CPAP HISTORY FROM: patient  HISTORY OF PRESENT ILLNESS:  18-2019, Mr Wayne Jordan, a meanwhile 57 year old Caucasian male patient , presents for a compliance visit on CPAP- compliance has been very poor for this month , as was last year's June .  Download from the CPAP machine showed percent of days with device usage was 86.7% In march and April , only 4 days without using the machine, over 4 hours used for 77%.  Average AHI is only 2 apneas per hour which is a good resolution, CPAP is set at 13 cmH2O.  The patient reports that his nasal pillow easily dislodged, especially in summer when he sweats.  Around the holidays and in winter the use was easier for him, but right now he typically takes the mask off because it moves and causes the air leak.  I able care has contacted him for new supplies and stated that they would be covered by insurance, but he received also a bill over $200,00. He is irritated, and not happy with CPAP.  The patient was suffered a MI several years ago, CAD - angioplasty and stenting- is still on Plavix, Lopressor, Nitrostat, Lipitor, aspirin and vitamin C.  What I would like to do is to offer the patient a different interface.  He also may not want to use a humidifier in the summer month as it may additionally worsen diaphoresis. His bedroom temperature is 70 degrees with an additional fan    07/05/16 Mr. Wayne Jordan is a 57 year old male with a history of obstructive sleep apnea on CPAP. He returns today for a compliance download. His download indicates that he uses machine 5 out of 30 days for compliance of 16.7%. He uses machine greater than 4 hours for compliance of 6.7%. His average use is 3 hours and 59 minutes. His residual AHI is 2.5 on 13 cm water. The patient states that he has not changed his supplies in  over a year. He states he typically takes the mask off because it moves and causes a leak. In the past the patient has had good compliance. He returns today for an evaluation.   HISTORY 05/26/15 Copied from Dr. Oliva Bustardohmeier's notes: Wayne Wayne Jordan is a 57 y.o. male , seen here as a referral  from Dr. Neva SeatGreene  for a sleep evaluation, He has been snoring since childhood, a has been obese for  many years and Dr Neva SeatGreene  reported he suffered a myocardial infarction in 2014 at the age of 89.  He considers himself a " big snorer". He doesn't remember gasping for breath or waking up air hungry. 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 He is married with 5 children, his wife works in a Industrial/product designermedical office. His last heart cath wa sin 10-2014, and " went well "   Sleep habits are as follows: he goes to bed between 11 PM and midnight, and usually falls asleep promptly. His bedroom is  cold, quiet and dark. He is restless, moving all the time.  He sleeps so fast , his wife has no opportunity to speak to him after entering the bedroom. Usually he sleeps until 4 -5 AM , has one bathroom break at that time and goes to back to sleep and rises at 6 AM - when his wife wakes him. That's on days he is not on the road. He has to drive throughout in the [facility]. He usually can sleep in a hotel, he always makes sure to get 6-7 hours of rest. He doesn't consider himself sleepy in daytime. His wife noted some irregular breathing , crescendo snoring. If he naps , he naps for several hours and thus avoids naps. He feels grumpy.   Sleep medical history and family sleep history: No ENT, nasal or neck surgeries. Father was a loud snorer. The patient has 3 brothers , all snore.  Social history: former smoker, quit in 2001 - ETOH: 2 drinks since 1991, Caffeine : 2-3  cheer wine a day.    I had the pleasure of seeing Mr. Symes today in a revisit from 05/26/2015.  Mr. Wayne Jordan underwent a sleep study on  02/20/2015 upon referral by Dr. Meredith Staggers, he had a mild sleep apnea with an AHI of 12.1, REM AHI 21.1 and supine AHI 23.9. He did have prolonged hypoxemia at 85 minutes of the total night of sleep were considered in hypoxemia with a nadir of 77%. For this reason CPAP was recommended and not a dental device or ENT procedure. He'll return for CPAP titration on February 28 and did extremely well. 13 cm water pressure was determined as the one that gave him the relief with an AHI of 0.0 and the oxygen nadir rose to 89%  He stated today that he needed about a week only to get use to use CPAP. He has a 96.7% excellent compliance with CPAP at home over the last 30 days the average AHI is also very good at 2.4 CPAP is used at 13 cm water with an average user time of 5 hours and 2 minutes at night the patient drives some nights for his work-related requirements and he will use the CPAP than in daytime. A pulse oximetry was performed after he had started the CPAP to confirm that she no longer suffers from hypoxia. His wake time oxygen level average is 94%, the time was lower than 89% of oxygen was reduced to 9.9 minutes. There is no need for additional oxygen supplementing CPAP.   REVIEW OF SYSTEMS: Out of a complete 14 system review of symptoms, the patient complains only of the following symptoms, and all other reviewed systems are negative.  See history of present illness  ALLERGIES: Allergies  Allergen Reactions  . Coconut Fatty Acids Shortness Of Breath and Swelling  . Penicillins Hives and Rash    HOME MEDICATIONS: Outpatient Medications Prior to Visit  Medication Sig Dispense Refill  . ascorbic acid (VITAMIN C) 500 MG tablet Take 500 mg by mouth daily.    Marland Kitchen aspirin 81 MG chewable tablet Chew 81 mg by mouth daily.    Marland Kitchen atorvastatin (LIPITOR) 80 MG tablet Take 1 tablet (80 mg total) by mouth daily. 90 tablet 3  . clopidogrel (PLAVIX) 75 MG tablet Take 1 tablet (75 mg total) by mouth daily.  90 tablet 3  . metoprolol tartrate (LOPRESSOR) 25 MG tablet Take 1 tablet (25 mg total) by mouth 2 (two) times daily. 180 tablet 3  .  nitroGLYCERIN (NITROSTAT) 0.4 MG SL tablet Place 1 tablet (0.4 mg total) under the tongue every 5 (five) minutes x 3 doses as needed for chest pain. 25 tablet 3   No facility-administered medications prior to visit.     PAST MEDICAL HISTORY: Past Medical History:  Diagnosis Date  . 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 Coronary artery disease    s/P PCI of the LAD with DES.  Nuclear stress test 04/2014 with no ischemia  . Hypercholesteremia   . Hyperlipidemia   . OSA on CPAP    followed by Dr. Jessie Foot  . Personal history of urinary calculi   . 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     PAST SURGICAL HISTORY: Past Surgical History:  Procedure Laterality Date  . INGUINAL HERNIA REPAIR Right 03/08/2012   Procedure: HERNIA REPAIR INGUINAL ADULT;  Surgeon: Axel Filler, MD;  [facility];  Service: General;  Laterality: Right;  . INSERTION OF MESH N/A 03/08/2012   Procedure: INSERTION OF MESH;  Surgeon: Axel Filler, MD;  Location: [facility] OR;  Service: General;  Laterality: N/A;  . LEFT HEART CATHETERIZATION WITH CORONARY ANGIOGRAM Right 10/07/2012   Procedure: LEFT HEART CATHETERIZATION WITH CORONARY ANGIOGRAM;  Surgeon: Corky Crafts, MD;  Location: [facility] CATH LAB;  Service: Cardiovascular;  Laterality: Right;  . PERCUTANEOUS STENT INTERVENTION  10/07/2012   Procedure: PERCUTANEOUS STENT INTERVENTION;  Surgeon: Corky Crafts, MD;  Location: [facility] CATH LAB;  Service: Cardiovascular;;    FAMILY HISTORY: Family History  Problem Relation Age of Onset  . Heart disease Father   . Heart attack Father   . CAD Father     SOCIAL HISTORY: Social History   Socioeconomic History  . Marital status: Married    Spouse name: Not on file  . Number of children: Not on file  . Years of education: Not on file  . Highest  education level: Not on file  Occupational History  . Not on file  Social Needs  . Financial resource strain: Not on file  . Food insecurity:    Worry: Not on file    Inability: Not on file  . Transportation needs:    Medical: Not on file    Non-medical: Not on file  Tobacco Use  . Smoking status: Former Smoker    Packs/day: 1.50    Years: 7.00    Pack years: 10.50    Types: Cigarettes    Last attempt to quit: 10/01/2012    Years since quitting: 4.7  . Smokeless tobacco: Never Used  Substance and Sexual Activity  . Alcohol use: Yes  . Drug use: No  . Sexual activity: Not on file  Lifestyle  . Physical activity:    Days per week: Not on file    Minutes per session: Not on file  . Stress: Not on file  Relationships  . Social connections:    Talks on phone: Not on file    Gets together: Not on file    Attends religious service: Not on file    Active member of club or organization: Not on file    Attends meetings of clubs or organizations: Not on file    Relationship status: Not on file  . Intimate partner violence:    Fear of current or ex partner: Not on file    Emotionally abused: Not on file    Physically abused: Not on file    Forced sexual activity: Not on file  Other Topics Concern  . Not on file  Social History Narrative   Drinks 2 cheerwines daily.      PHYSICAL EXAM  Vitals:   07/05/17 0805  BP: 112/82  Pulse: 66  Weight: 269 lb 8 oz (122.2 kg)  Height: 5\' 11"  (1.803 m)   Body mass index is 37.59 kg/m.  Generalized: Well developed, in no acute distress   Neck size 19.5"  Mallampati 5- no braces or expanders, crowded dental status/    Neurological examination  Mentation: Alert oriented to time, place, history taking. Follows all commands speech and language fluent Cranial nerve Pupils were equal round reactive to light. Extraocular movements were full, visual field were full on confrontational test. Facial sensation and strength were normal.  Uvula invisible -  tongue midline. Head turning and shoulder shrug  were normal and symmetric. DIAGNOSTIC DATA (LABS, IMAGING, TESTING) - I reviewed patient records, labs, notes, testing and imaging myself where available.  CPAP downloads, March, April and June 2019.   Lab Results  Component Value Date   WBC 8.1 04/11/2015   HGB 15.1 04/11/2015   HCT 43.5 04/11/2015   MCV 88.5 04/11/2015   PLT 234 03/28/2013      Component Value Date/Time   NA 141 03/28/2013 0626   K 4.4 03/28/2013 0626   CL 106 03/28/2013 0626   CO2 21 03/28/2013 0626   GLUCOSE 126 (H) 03/28/2013 0626   BUN 12 03/28/2013 0626   CREATININE 0.97 03/28/2013 0626   CALCIUM 9.5 03/28/2013 0626   PROT 6.6 02/21/2017 0934   ALBUMIN 4.3 02/21/2017 0934   AST 21 02/21/2017 0934   ALT 26 02/21/2017 0934   ALKPHOS 125 (H) 02/21/2017 0934   BILITOT 0.6 02/21/2017 0934   GFRNONAA >90 03/28/2013 0626   GFRAA >90 03/28/2013 0626   Lab Results  Component Value Date   CHOL 96 (L) 02/21/2017   HDL 43 02/21/2017   LDLCALC 30 02/21/2017   TRIG 113 02/21/2017   CHOLHDL 2.2 02/21/2017   Lab Results  Component Value Date   HGBA1C 5.6 10/07/2012      ASSESSMENT AND PLAN 57 y.o. year old male  has a past medical history of Acute myocardial infarction of other anterior wall, initial episode of care, Coronary artery disease, Hypercholesteremia, Hyperlipidemia, OSA on CPAP, Personal history of urinary calculi, and 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. here with:  1. Obstructive sleep apnea on CPAP- compliance drops in summer.      I will send a prescription to his DME company asking that they send him new supplies and fitting him for a new mask. He is reportedly sweating under the headgear , condensation water -  . Hopefully with new supplies he will wear the machine longer and each night. He needs to lose weight.  I discussed with the patient the risk associated with untreated sleep apnea. He voices understanding.  I spent 20  minutes with the patient. 50% of this time was spent reviewing the patient's CPAP download as well as risk associated with untreated sleep apnea  RV in 12 month with NP.    Melvyn Novas, MD   07/05/2017, 8:49 AM [facility] [address] [place], [place] [address] [phone]  I

## 2017-07-05 NOTE — Telephone Encounter (Signed)
Pt was fitted with a respironics dreamwear wisp. Medium nasal cushion and large headgear.

## 2018-01-24 ENCOUNTER — Ambulatory Visit (INDEPENDENT_AMBULATORY_CARE_PROVIDER_SITE_OTHER): Payer: BC Managed Care – PPO | Admitting: Cardiology

## 2018-01-24 ENCOUNTER — Encounter: Payer: Self-pay | Admitting: Cardiology

## 2018-01-24 VITALS — BP 110/70 | HR 76 | Ht 71.0 in | Wt 274.8 lb

## 2018-01-24 DIAGNOSIS — E782 Mixed hyperlipidemia: Secondary | ICD-10-CM | POA: Diagnosis not present

## 2018-01-24 DIAGNOSIS — Z9989 Dependence on other enabling machines and devices: Secondary | ICD-10-CM

## 2018-01-24 DIAGNOSIS — I2510 Atherosclerotic heart disease of native coronary artery without angina pectoris: Secondary | ICD-10-CM

## 2018-01-24 DIAGNOSIS — G4733 Obstructive sleep apnea (adult) (pediatric): Secondary | ICD-10-CM

## 2018-01-24 NOTE — Progress Notes (Signed)
Cardiology Office Note:    Date:  01/24/2018   ID:  Wayne AppHenry B Hanback, DOB 07/01/1960, MRN [id number]  PCP:  [name], [name], MD  Cardiologist:  Gypsy Balsamobert Brissia Delisa, MD    Referring MD: [name], [name], MD   Chief Complaint  Patient presents with  . Follow-up  Doing well  History of Present Illness:    Wayne Jordan is a 58 y.o. male with coronary artery disease status post PTCA and drug-eluting stent to proximal LAD in face of acute non-STEMI in 2014 doing well denies have any chest pain tightness squeezing pressure burning chest.  He tried to go to the gym on the regular basis but rarely have a chance to do it and admits that he does not push himself heart while exercising.  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  He will have cholesterol checked within the next few weeks and ask him to provide me with a copy of it.  Past Medical History:  Diagnosis Date  . 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 Coronary artery disease    s/P PCI of the LAD with DES.  Nuclear stress test 04/2014 with no ischemia  . Hypercholesteremia   . Hyperlipidemia   . OSA on CPAP    followed by Dr. Jessie Footoeimyer  . Personal history of urinary calculi   . 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     Past Surgical History:  Procedure Laterality Date  . INGUINAL HERNIA REPAIR Right 03/08/2012   Procedure: HERNIA REPAIR INGUINAL ADULT;  Surgeon: Axel FillerArmando Ramirez, MD;  [facility];  Service: General;  Laterality: Right;  . INSERTION OF MESH N/A 03/08/2012   Procedure: INSERTION OF MESH;  Surgeon: Axel FillerArmando Ramirez, MD;  Location: [facility] OR;  Service: General;  Laterality: N/A;  . LEFT HEART CATHETERIZATION WITH CORONARY ANGIOGRAM Right 10/07/2012   Procedure: LEFT HEART CATHETERIZATION WITH CORONARY ANGIOGRAM;  Surgeon: Corky CraftsJayadeep S Varanasi, MD;  [facility];  Service: Cardiovascular;  Laterality: Right;  .  PERCUTANEOUS STENT INTERVENTION  10/07/2012   Procedure: PERCUTANEOUS STENT INTERVENTION;  Surgeon: Corky CraftsJayadeep S Varanasi, MD;  [facility];  Service: Cardiovascular;;    Current Medications: Current Meds  Medication Sig  . ascorbic acid (VITAMIN C) 500 MG tablet Take 500 mg by mouth daily.  Marland Kitchen. aspirin 81 MG chewable tablet Chew 81 mg by mouth daily.  Marland Kitchen. atorvastatin (LIPITOR) 80 MG tablet Take 1 tablet (80 mg total) by mouth daily.  . clopidogrel (PLAVIX) 75 MG tablet Take 1 tablet (75 mg total) by mouth daily.  . metoprolol tartrate (LOPRESSOR) 25 MG tablet Take 1 tablet (25 mg total) by mouth 2 (two) times daily.  . nitroGLYCERIN (NITROSTAT) 0.4 MG SL tablet Place 1 tablet (0.4 mg total) under the tongue every 5 (five) minutes x 3 doses as needed for chest pain.     Allergies:   Coconut fatty acids and Penicillins   Social History   Socioeconomic History  . Marital status: Married    Spouse name: Not on file  . Number of children: Not on file  . Years of education: Not on file  . Highest education level: Not on file  Occupational History  . Not on file  Social Needs  . Financial resource strain: Not on file  . Food insecurity:    Worry: Not on file    Inability:  Not on file  . Transportation needs:    Medical: Not on file    Non-medical: Not on file  Tobacco Use  . Smoking status: Former Smoker    Packs/day: 1.50    Years: 7.00    Pack years: 10.50    Types: Cigarettes    Last attempt to quit: 10/01/2012    Years since quitting: 5.3  . Smokeless tobacco: Never Used  Substance and Sexual Activity  . Alcohol use: Yes  . Drug use: No  . Sexual activity: Not on file  Lifestyle  . Physical activity:    Days per week: Not on file    Minutes per session: Not on file  . Stress: Not on file  Relationships  . Social connections:    Talks on phone: Not on file    Gets together: Not on file    Attends religious service: Not on file    Active member of club or  organization: Not on file    Attends meetings of clubs or organizations: Not on file    Relationship status: Not on file  Other Topics Concern  . Not on file  Social History Narrative   Drinks 2 cheerwines daily.     Family History: The patient's family history includes CAD in his father; Heart attack in his father; Heart disease in his father. ROS:   Please see the history of present illness.    All 14 point review of systems negative except as described per history of present illness  EKGs/Labs/Other Studies Reviewed:      Recent Labs: 02/21/2017: ALT 26  Recent Lipid Panel    Component Value Date/Time   CHOL 96 (L) 02/21/2017 0934   CHOL CANCELED 11/07/2014 0921   CHOL SEE BELOW 11/07/2014 0921   CHOL 128 04/26/2014 0757   TRIG 113 02/21/2017 0934   TRIG CANCELED 11/07/2014 0921   TRIG SEE BELOW 11/07/2014 0921   TRIG 140 04/26/2014 0757   HDL 43 02/21/2017 0934   HDL CANCELED 11/07/2014 0921   HDL SEE BELOW 11/07/2014 0921   HDL 39 (L) 04/26/2014 0757   CHOLHDL 2.2 02/21/2017 0934   CHOLHDL 2.6 04/18/2015 0930   VLDL 22 04/18/2015 0930   LDLCALC 30 02/21/2017 0934   LDLCALC CANCELED 11/07/2014 0921   LDLCALC SEE BELOW 11/07/2014 0921   LDLCALC 61 04/26/2014 0757    Physical Exam:    VS:  BP 110/70   Pulse 76   Ht 5\' 11"  (1.803 m)   Wt 274 lb 12.8 oz (124.6 kg)   SpO2 96%   BMI 38.33 kg/m     Wt Readings from Last 3 Encounters:  01/24/18 274 lb 12.8 oz (124.6 kg)  07/05/17 269 lb 8 oz (122.2 kg)  02/14/17 269 lb 12.8 oz (122.4 kg)     GEN:  Well nourished, well developed in no acute distress HEENT: Normal NECK: No JVD; No carotid bruits LYMPHATICS: No lymphadenopathy CARDIAC: RRR, no murmurs, no rubs, no gallops RESPIRATORY:  Clear to auscultation without rales, wheezing or rhonchi  ABDOMEN: Soft, non-tender, non-distended MUSCULOSKELETAL:  No edema; No deformity  SKIN: Warm and dry LOWER EXTREMITIES: no swelling NEUROLOGIC:  Alert and oriented  x 3 PSYCHIATRIC:  Normal affect   ASSESSMENT:    1. Coronary artery disease involving native coronary artery of native heart without angina pectoris   2. OSA on CPAP   3. Mixed hyperlipidemia    PLAN:    In order of problems listed above:  1. Coronary disease status post PTCA and stenting to proximal LAD in 2014 doing well from that point of view on appropriate medication which I will continue.  He is on dual antiplatelets therapy which I think is a reasonable continuing in  he is situation. 2. Obstructive sleep apnea use CPAP mask on a regular basis 3. Mixed dyslipidemia continue using high intensity statin.  Awaiting results of his fasting lipid profile from primary care physician  We spent a great deal of time talking about risk factors modification exercises on the regular basis advised him to get Fitbit, we talked about diet, we talked about Mediterranean diet.   Medication Adjustments/Labs and Tests Ordered: Current medicines are reviewed at length with the patient today.  Concerns regarding medicines are outlined above.  No orders of the defined types were placed in this encounter.  Medication changes: No orders of the defined types were placed in this encounter.   Signed, Georgeanna Leaobert J. Kamaya Keckler, MD, [facility] 01/24/2018 1:40 PM    [facility]

## 2018-01-24 NOTE — Patient Instructions (Signed)
Medication Instructions:   Your physician recommends that you continue on your current medications as directed. Please refer to the Current Medication list given to you today.  If you need a refill on your cardiac medications before your next appointment, please call your pharmacy.   Lab work:  NONE  If you have labs (blood work) drawn today and your tests are completely normal, you will receive your results only by: Marland Kitchen MyChart Message (if you have MyChart) OR . A paper copy in the mail If you have any lab test that is abnormal or we need to change your treatment, we will call you to review the results.  Testing/Procedures:  NONE  Follow-Up: At [facility], you and your health needs are our priority.  As part of our continuing mission to provide you with exceptional heart care, we have created designated Provider Care Teams.  These Care Teams include your primary Cardiologist (physician) and Advanced Practice Providers (APPs -  Physician Assistants and Nurse Practitioners) who all work together to provide you with the care you need, when you need it. You will need a follow up appointment in 6 months.  Please call our office 2 months in advance to schedule this appointment.  You may see Gypsy Balsam, MD or another member of our [facility] Provider Team in [place]: Norman Herrlich, MD . Belva Crome, MD

## 2018-03-06 ENCOUNTER — Telehealth: Payer: Self-pay | Admitting: Neurology

## 2018-03-06 NOTE — Telephone Encounter (Signed)
Pt needs last 30 days cpap emailed to him. He is having DOT physical tomorrow and will need to take it with. He will have it at [facility]. Please call to advise

## 2018-03-06 NOTE — Telephone Encounter (Signed)
Called the patient back and advised that in the care orchestrator it appears he has not used machine since 02/06/2018. Pt states that he has. Advised the patient I will reach out to [employer] to make sure that we are connected to his machine.  Pt states he will have the doc office call out office tomorrow and we can fax over the information to their office. Advised that would not be a problem Advised the pt I will call if I have an issue getting [employer] to get me his report for the last 30 days. Pt verbalized understanding.   If pt office calls tomorrow just get a fax number and who I attention the report to

## 2018-03-07 NOTE — Telephone Encounter (Signed)
Called the patient back to let him know that we dont have any information past 02/06/2018 off of the machine. Patient will come in and bring his machine so that way we can download the new data.

## 2018-03-19 ENCOUNTER — Other Ambulatory Visit: Payer: Self-pay | Admitting: Cardiology

## 2018-03-21 ENCOUNTER — Other Ambulatory Visit: Payer: Self-pay

## 2018-03-21 NOTE — Telephone Encounter (Signed)
180 day med refill sent for atorvastatin, clopidogrel, and metoprolol per pharmacy request.

## 2018-07-03 ENCOUNTER — Telehealth: Payer: Self-pay | Admitting: Neurology

## 2018-07-03 NOTE — Telephone Encounter (Signed)
Due to current COVID 19 pandemic, our office is severely reducing in office visits until further notice, in order to minimize the risk to our patients and healthcare providers.   Called patient to discuss a virtual visit and to r/s appt due to Dr. Brett Fairy schedule template change. Unable to LVM, none provided. If patient calls back, he will need an afternoon VV with MD or in office when office re-opens in July.

## 2018-07-04 ENCOUNTER — Encounter: Payer: Self-pay | Admitting: Adult Health

## 2018-07-05 ENCOUNTER — Encounter: Payer: Self-pay | Admitting: Neurology

## 2018-07-05 NOTE — Telephone Encounter (Signed)
Called the patient to review his chart and change over to a VV and the patient didn't realize he had an apt. He was unable to make this date and time I have placed him on Ward Givens, NP scheduled for 6/18 at 3 pm.  Our office is now providing the capability to offer the patients virtual visits at this time. Informed of what that process looks like and informed that the Virtual visit will still be billed through insurance as such. Due to [place],informed the patient since the appointment is taking place over the phone/internet app, we can't guarantee the security of the phone line. With that said if we do move forward I would have to get verbal consent to complete the Video Visit/Phone call. Patient gave verbal consent to move forward with the video visit. I have reviewed the patient's chart and made sure that everything is up to date. Patient is also made aware that since this is a video visit we are able to complete the visit but a physical exam is not able to be done since the patient is not present in person. Pt request the link be texted/emailed to them. Pt informed that the front staff will contact the patient aprox 30 minutes prior to the scheduled appointment to "check them in" and make sure that everything is ready for the appointment to get started. Pt verbalized understanding of this information and will states to be ready for the visit at least 15-30 min prior to the visit. Reminded the patient once more that this is treated as a Office visit and the patient must be prepared for the visit and ready at the time of their appointment preferably in a well lit area where they have good connection for the visit. Pt verbalized understanding. Link was sent to the patient email for a [email or web address] visit [email or web address].

## 2018-07-06 ENCOUNTER — Other Ambulatory Visit: Payer: Self-pay

## 2018-07-06 ENCOUNTER — Ambulatory Visit (INDEPENDENT_AMBULATORY_CARE_PROVIDER_SITE_OTHER): Payer: BC Managed Care – PPO | Admitting: Adult Health

## 2018-07-06 DIAGNOSIS — Z9989 Dependence on other enabling machines and devices: Secondary | ICD-10-CM | POA: Diagnosis not present

## 2018-07-06 DIAGNOSIS — G4733 Obstructive sleep apnea (adult) (pediatric): Secondary | ICD-10-CM

## 2018-07-06 NOTE — Progress Notes (Signed)
  [facility] [address] [place]. [place] [address] [phone]     Virtual Visit via Telephone Note  I connected with Wayne Jordan on 07/06/18 at  3:00 PM EDT by telephone located remotely at [facility] and verified that I am speaking with the correct person using two identifiers who reports being located at home.    Visit scheduled by Ward Givens. I discussed the limitations, risks, security and privacy concerns of performing an evaluation and management service by telephone and the availability of in person appointments. I also discussed with the patient that there may be a patient responsible charge related to this service. The patient expressed understanding and agreed to proceed.   Patient was scheduled for video visit however the video would not work.  He was converted to a telephone visit   History of Present Illness:  Wayne Jordan is a 58 y.o. male who has been followed in this office for  OSA on CPAP.  He was initially scheduled for face-to-face office follow up visit today time but due to [place], visit rescheduled for non-face-to-face telephone visit with patients consent. Unable to participate in video visit due to lack of access to device with camera.     The patient states that download indicates that he used his machine 27 out of 30 days for compliance of 90%.  He uses machine greater than 4 hours each night.  On average he uses his machine 7 hours and 37 minutes.  His residual AHI is 2.2 on 13 cm of water.  He states occasionally he will feel the mask leaking however he had a mask refitting and does like his new mask.  He denies any new issues.   Observations/Objective:  Generalized: Well developed, in no acute distress   Neurological examination  Mentation: Alert oriented to time, place, history taking. speech and language fluent  Assessment and Plan:  1. OSA on CPAP  The patient's CPAP download shows excellent  compliance and good treatment of his apnea.  He is encouraged to continue using CPAP nightly and greater than 4 hours each night.  He is advised that if his symptoms worsen or he develops new symptoms he should let us know.  He will follow-up in 1 year  Follow Up Instructions:    FU 1 year   I discussed the assessment and treatment plan with the patient.  The patient was provided an opportunity to ask questions and all were answered to their satisfaction. The patient agreed with the plan and verbalized an understanding of the instructions.   I provided 12 minutes of non-face-to-face time during this encounter.    Ward Givens NP-C  [facility] [address] [place], [place] [address]  Phone [phone] Fax [phone]

## 2018-07-11 ENCOUNTER — Ambulatory Visit: Payer: BC Managed Care – PPO | Admitting: Neurology

## 2018-07-13 ENCOUNTER — Ambulatory Visit (INDEPENDENT_AMBULATORY_CARE_PROVIDER_SITE_OTHER): Payer: BC Managed Care – PPO | Admitting: Cardiology

## 2018-07-13 ENCOUNTER — Encounter: Payer: Self-pay | Admitting: Cardiology

## 2018-07-13 ENCOUNTER — Other Ambulatory Visit: Payer: Self-pay

## 2018-07-13 VITALS — BP 114/70 | HR 77 | Wt 284.8 lb

## 2018-07-13 DIAGNOSIS — G4733 Obstructive sleep apnea (adult) (pediatric): Secondary | ICD-10-CM

## 2018-07-13 DIAGNOSIS — I252 Old myocardial infarction: Secondary | ICD-10-CM

## 2018-07-13 DIAGNOSIS — E782 Mixed hyperlipidemia: Secondary | ICD-10-CM | POA: Diagnosis not present

## 2018-07-13 DIAGNOSIS — I2510 Atherosclerotic heart disease of native coronary artery without angina pectoris: Secondary | ICD-10-CM

## 2018-07-13 DIAGNOSIS — Z9989 Dependence on other enabling machines and devices: Secondary | ICD-10-CM

## 2018-07-13 NOTE — Patient Instructions (Signed)
Medication Instructions:  Your physician recommends that you continue on your current medications as directed. Please refer to the Current Medication list given to you today.  If you need a refill on your cardiac medications before your next appointment, please call your pharmacy.   Lab work: Your physician recommends that you return for lab work today: lipids   If you have labs (blood work) drawn today and your tests are completely normal, you will receive your results only by: Marland Kitchen. MyChart Message (if you have MyChart) OR . A paper copy in the mail If you have any lab test that is abnormal or we need to change your treatment, we will call you to review the results.  Testing/Procedures: Your physician has requested that you have an echocardiogram. Echocardiography is a painless test that uses sound waves to create images of your heart. It provides your doctor with information about the size and shape of your heart and how well your heart's chambers and valves are working. This procedure takes approximately one hour. There are no restrictions for this procedure.    Follow-Up: At [facility], you and your health needs are our priority.  As part of our continuing mission to provide you with exceptional heart care, we have created designated Provider Care Teams.  These Care Teams include your primary Cardiologist (physician) and Advanced Practice Providers (APPs -  Physician Assistants and Nurse Practitioners) who all work together to provide you with the care you need, when you need it. You will need a follow up appointment in 6 months.  Please call our office 2 months in advance to schedule this appointment.  You may see Gypsy Balsamobert Krasowski, MD or another member of our [facility] Provider Team in [place]: Norman HerrlichBrian Munley, MD . Belva Cromeajan Revankar, MD  Any Other Special Instructions Will Be Listed Below (If Applicable).  Echocardiogram An echocardiogram is a procedure that uses painless sound  waves (ultrasound) to produce an image of the heart. Images from an echocardiogram can provide important information about:  Signs of coronary artery disease (CAD).  Aneurysm detection. An aneurysm is a weak or damaged part of an artery wall that bulges out from the normal force of blood pumping through the body.  Heart size and shape. Changes in the size or shape of the heart can be associated with certain conditions, including heart failure, aneurysm, and CAD.  Heart muscle function.  Heart valve function.  Signs of a past heart attack.  Fluid buildup around the heart.  Thickening of the heart muscle.  A tumor or infectious growth around the heart valves. Tell a health care provider about:  Any allergies you have.  All medicines you are taking, including vitamins, herbs, eye drops, creams, and over-the-counter medicines.  Any blood disorders you have.  Any surgeries you have had.  Any medical conditions you have.  Whether you are pregnant or may be pregnant. What are the risks? Generally, this is a safe procedure. However, problems may occur, including:  Allergic reaction to dye (contrast) that may be used during the procedure. What happens before the procedure? No specific preparation is needed. You may eat and drink normally. What happens during the procedure?   An IV tube may be inserted into one of your veins.  You may receive contrast through this tube. A contrast is an injection that improves the quality of the pictures from your heart.  A gel will be applied to your chest.  A wand-like tool (transducer) will be moved over  your chest. The gel will help to transmit the sound waves from the transducer.  The sound waves will harmlessly bounce off of your heart to allow the heart images to be captured in real-time motion. The images will be recorded on a computer. The procedure may vary among health care providers and hospitals. What happens after the procedure?   You may return to your normal, everyday life, including diet, activities, and medicines, unless your health care provider tells you not to do that. Summary  An echocardiogram is a procedure that uses painless sound waves (ultrasound) to produce an image of the heart.  Images from an echocardiogram can provide important information about the size and shape of your heart, heart muscle function, heart valve function, and fluid buildup around your heart.  You do not need to do anything to prepare before this procedure. You may eat and drink normally.  After the echocardiogram is completed, you may return to your normal, everyday life, unless your health care provider tells you not to do that. This information is not intended to replace advice given to you by your health care provider. Make sure you discuss any questions you have with your health care provider. Document Released: 01/02/2000 Document Revised: 02/07/2016 Document Reviewed: 02/07/2016 Elsevier Interactive Patient Education  [employer].   '

## 2018-07-13 NOTE — Progress Notes (Signed)
Cardiology Office Note:    Date:  07/13/2018   ID:  Wayne Jordan, DOB 09/23/1960, MRN [id number]  PCP:  [name], [name], MD  Cardiologist:  Gypsy Balsamobert Daeshaun Specht, MD    Referring MD: [name], [name], MD   Chief Complaint  Patient presents with  . Follow-up  Doing well  History of Present Illness:    Wayne Jordan is a 58 y.o. male past medical history significant for acute myocardial infarction, status post stent to LAD which was drug-eluting in 2014, morbid obesity, hypercholesterolemia, obstructive sleep apnea comes today 2 months follow-up overall doing well, asymptomatic, no chest pain, no tightness, no pressure, no burning no squeezing in the chest.  Does yoga in the morning also try to do some exercises and afternoon blood meets the fact that he is somewhat sedentary.  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  Past Medical History:  Diagnosis Date  . 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 Coronary artery disease    s/P PCI of the LAD with DES.  Nuclear stress test 04/2014 with no ischemia  . Hypercholesteremia   . Hyperlipidemia   . OSA on CPAP    followed by Dr. Jessie Footoeimyer  . Personal history of urinary calculi   . 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     Past Surgical History:  Procedure Laterality Date  . INGUINAL HERNIA REPAIR Right 03/08/2012   Procedure: HERNIA REPAIR INGUINAL ADULT;  Surgeon: Axel FillerArmando Ramirez, MD;  [facility];  Service: General;  Laterality: Right;  . INSERTION OF MESH N/A 03/08/2012   Procedure: INSERTION OF MESH;  Surgeon: Axel FillerArmando Ramirez, MD;  Location: [facility] OR;  Service: General;  Laterality: N/A;  . LEFT HEART CATHETERIZATION WITH CORONARY ANGIOGRAM Right 10/07/2012   Procedure: LEFT HEART CATHETERIZATION WITH CORONARY ANGIOGRAM;  Surgeon: Corky CraftsJayadeep S Varanasi, MD;  [facility];  Service: Cardiovascular;  Laterality: Right;  . PERCUTANEOUS STENT INTERVENTION  10/07/2012   Procedure: PERCUTANEOUS  STENT INTERVENTION;  Surgeon: Corky CraftsJayadeep S Varanasi, MD;  [facility];  Service: Cardiovascular;;    Current Medications: Current Meds  Medication Sig  . ascorbic acid (VITAMIN C) 500 MG tablet Take 500 mg by mouth daily.  Marland Kitchen. aspirin 81 MG chewable tablet Chew 81 mg by mouth daily.  Marland Kitchen. atorvastatin (LIPITOR) 80 MG tablet TAKE 1 TABLET DAILY  . clopidogrel (PLAVIX) 75 MG tablet TAKE 1 TABLET DAILY  . metoprolol tartrate (LOPRESSOR) 25 MG tablet TAKE 1 TABLET TWICE A DAY  . nitroGLYCERIN (NITROSTAT) 0.4 MG SL tablet Place 1 tablet (0.4 mg total) under the tongue every 5 (five) minutes x 3 doses as needed for chest pain.     Allergies:   Coconut fatty acids and Penicillins   Social History   Socioeconomic History  . Marital status: Married    Spouse name: Not on file  . Number of children: Not on file  . Years of education: Not on file  . Highest education level: Not on file  Occupational History  . Not on file  Social Needs  . Financial resource strain: Not on file  . Food insecurity    Worry: Not on file    Inability: Not on file  . Transportation needs    Medical: Not on file    Non-medical: Not on file  Tobacco Use  . Smoking status: Former Smoker    Packs/day: 1.50    Years: 7.00  Pack years: 10.50    Types: Cigarettes    Quit date: 10/01/2012    Years since quitting: 5.7  . Smokeless tobacco: Never Used  Substance and Sexual Activity  . Alcohol use: Yes  . Drug use: No  . Sexual activity: Not on file  Lifestyle  . Physical activity    Days per week: Not on file    Minutes per session: Not on file  . Stress: Not on file  Relationships  . Social Musicianconnections    Talks on phone: Not on file    Gets together: Not on file    Attends religious service: Not on file    Active member of club or organization: Not on file    Attends meetings of clubs or organizations: Not on file    Relationship status: Not on file  Other Topics Concern  . Not on file   Social History Narrative   Drinks 2 cheerwines daily.     Family History: The patient's family history includes CAD in his father; Heart attack in his father; Heart disease in his father. ROS:   Please see the history of present illness.    All 14 point review of systems negative except as described per history of present illness  EKGs/Labs/Other Studies Reviewed:      Recent Labs: No results found for requested labs within last 8760 hours.  Recent Lipid Panel    Component Value Date/Time   CHOL 96 (L) 02/21/2017 0934   CHOL CANCELED 11/07/2014 0921   CHOL SEE BELOW 11/07/2014 0921   CHOL 128 04/26/2014 0757   TRIG 113 02/21/2017 0934   TRIG CANCELED 11/07/2014 0921   TRIG SEE BELOW 11/07/2014 0921   TRIG 140 04/26/2014 0757   HDL 43 02/21/2017 0934   HDL CANCELED 11/07/2014 0921   HDL SEE BELOW 11/07/2014 0921   HDL 39 (L) 04/26/2014 0757   CHOLHDL 2.2 02/21/2017 0934   CHOLHDL 2.6 04/18/2015 0930   VLDL 22 04/18/2015 0930   LDLCALC 30 02/21/2017 0934   LDLCALC CANCELED 11/07/2014 0921   LDLCALC SEE BELOW 11/07/2014 0921   LDLCALC 61 04/26/2014 0757    Physical Exam:    VS:  BP 114/70   Pulse 77   Wt 284 lb 12.8 oz (129.2 kg)   SpO2 96%   BMI 39.72 kg/m     Wt Readings from Last 3 Encounters:  07/13/18 284 lb 12.8 oz (129.2 kg)  01/24/18 274 lb 12.8 oz (124.6 kg)  07/05/17 269 lb 8 oz (122.2 kg)     GEN:  Well nourished, well developed in no acute distress HEENT: Normal NECK: No JVD; No carotid bruits LYMPHATICS: No lymphadenopathy CARDIAC: RRR, no murmurs, no rubs, no gallops RESPIRATORY:  Clear to auscultation without rales, wheezing or rhonchi  ABDOMEN: Soft, non-tender, non-distended MUSCULOSKELETAL:  No edema; No deformity  SKIN: Warm and dry LOWER EXTREMITIES: no swelling NEUROLOGIC:  Alert and oriented x 3 PSYCHIATRIC:  Normal affect   ASSESSMENT:    1. Coronary artery disease involving native coronary artery of native heart without  angina pectoris   2. Mixed hyperlipidemia   3. Morbid obesity due to excess calories (HCC)   4. OSA on CPAP   5. Old MI (myocardial infarction)    PLAN:    In order of problems listed above:  1. 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  He is asymptomatic on appropriate medication which I will  continue 2. Mixed dyslipidemia high intensity statin which I will continue will check fasting lipid profile today 3. Morbid obesity he understand that he is to lose weight and will work on diet 4. Obstructive sleep apnea using CPAP mask I advised him to continue 5. All them I will check an echocardiogram as well as EKG.   Medication Adjustments/Labs and Tests Ordered: Current medicines are reviewed at length with the patient today.  Concerns regarding medicines are outlined above.  No orders of the defined types were placed in this encounter.  Medication changes: No orders of the defined types were placed in this encounter.   Signed, Park Liter, MD, [facility] 07/13/2018 2:27 PM    [facility]

## 2018-07-14 LAB — LIPID PANEL
Chol/HDL Ratio: 3 ratio (ref 0.0–5.0)
Cholesterol, Total: 111 mg/dL (ref 100–199)
HDL: 37 mg/dL — ABNORMAL LOW (ref >39)
LDL Calculated: 36 mg/dL (ref 0–99)
Triglycerides: 188 mg/dL — ABNORMAL HIGH (ref 0–149)
VLDL Cholesterol Cal: 38 mg/dL (ref 5–40)

## 2018-07-25 ENCOUNTER — Other Ambulatory Visit: Payer: Self-pay

## 2018-07-25 ENCOUNTER — Ambulatory Visit (HOSPITAL_BASED_OUTPATIENT_CLINIC_OR_DEPARTMENT_OTHER)
Admission: RE | Admit: 2018-07-25 | Discharge: 2018-07-25 | Disposition: A | Discharge status: Home / Self Care | Payer: BC Managed Care – PPO | Source: Ambulatory Visit | Attending: Cardiology | Admitting: Cardiology

## 2018-07-25 DIAGNOSIS — I2510 Atherosclerotic heart disease of native coronary artery without angina pectoris: Secondary | ICD-10-CM | POA: Insufficient documentation

## 2018-07-25 MED ORDER — PERFLUTREN LIPID MICROSPHERE
1.0000 mL | INTRAVENOUS | Status: AC | PRN
  Administered 2018-07-25: 3 mL via INTRAVENOUS
  Filled 2018-07-25: qty 10

## 2018-07-25 NOTE — Progress Notes (Signed)
  Echocardiogram 2D Echocardiogram has been performed.  Zara Council 07/25/2018, 3:20 PM

## 2018-07-27 ENCOUNTER — Telehealth: Payer: Self-pay | Admitting: *Deleted

## 2018-07-27 NOTE — Telephone Encounter (Signed)
-----   Message from Park Liter, MD sent at 07/27/2018  1:09 PM EDT ----- Normal lvef

## 2018-07-27 NOTE — Telephone Encounter (Signed)
Telephone call to patient. Informed of echo results. 

## 2018-07-30 ENCOUNTER — Emergency Department (HOSPITAL_COMMUNITY)
Admission: EM | Admit: 2018-07-30 | Discharge: 2018-07-31 | Disposition: A | Discharge status: Home / Self Care | Payer: BC Managed Care – PPO | Attending: Emergency Medicine | Admitting: Emergency Medicine

## 2018-07-30 ENCOUNTER — Emergency Department (HOSPITAL_COMMUNITY): Payer: BC Managed Care – PPO

## 2018-07-30 ENCOUNTER — Encounter (HOSPITAL_COMMUNITY): Payer: Self-pay | Admitting: Emergency Medicine

## 2018-07-30 DIAGNOSIS — I2510 Atherosclerotic heart disease of native coronary artery without angina pectoris: Secondary | ICD-10-CM | POA: Diagnosis not present

## 2018-07-30 DIAGNOSIS — Z79899 Other long term (current) drug therapy: Secondary | ICD-10-CM | POA: Insufficient documentation

## 2018-07-30 DIAGNOSIS — Y9389 Activity, other specified: Secondary | ICD-10-CM | POA: Diagnosis not present

## 2018-07-30 DIAGNOSIS — S61422A Laceration with foreign body of left hand, initial encounter: Secondary | ICD-10-CM | POA: Insufficient documentation

## 2018-07-30 DIAGNOSIS — I252 Old myocardial infarction: Secondary | ICD-10-CM | POA: Diagnosis not present

## 2018-07-30 DIAGNOSIS — S61412A Laceration without foreign body of left hand, initial encounter: Secondary | ICD-10-CM | POA: Diagnosis present

## 2018-07-30 DIAGNOSIS — Z23 Encounter for immunization: Secondary | ICD-10-CM | POA: Diagnosis not present

## 2018-07-30 DIAGNOSIS — Y999 Unspecified external cause status: Secondary | ICD-10-CM | POA: Diagnosis not present

## 2018-07-30 DIAGNOSIS — S92412A Displaced fracture of proximal phalanx of left great toe, initial encounter for closed fracture: Secondary | ICD-10-CM | POA: Diagnosis not present

## 2018-07-30 DIAGNOSIS — S62617A Displaced fracture of proximal phalanx of left little finger, initial encounter for closed fracture: Secondary | ICD-10-CM | POA: Insufficient documentation

## 2018-07-30 DIAGNOSIS — Z7902 Long term (current) use of antithrombotics/antiplatelets: Secondary | ICD-10-CM | POA: Diagnosis not present

## 2018-07-30 DIAGNOSIS — Z03818 Encounter for observation for suspected exposure to other biological agents ruled out: Secondary | ICD-10-CM | POA: Insufficient documentation

## 2018-07-30 DIAGNOSIS — Y92410 Unspecified street and highway as the place of occurrence of the external cause: Secondary | ICD-10-CM | POA: Insufficient documentation

## 2018-07-30 LAB — CBC WITH DIFFERENTIAL/PLATELET
Abs Immature Granulocytes: 0.02 10*3/uL (ref 0.00–0.07)
Basophils Absolute: 0.1 10*3/uL (ref 0.0–0.1)
Basophils Relative: 1 %
Eosinophils Absolute: 0.1 10*3/uL (ref 0.0–0.5)
Eosinophils Relative: 1 %
HCT: 47 % (ref 39.0–52.0)
Hemoglobin: 15.3 g/dL (ref 13.0–17.0)
Immature Granulocytes: 0 %
Lymphocytes Relative: 36 %
Lymphs Abs: 3.2 10*3/uL (ref 0.7–4.0)
MCH: 30.8 pg (ref 26.0–34.0)
MCHC: 32.6 g/dL (ref 30.0–36.0)
MCV: 94.8 fL (ref 80.0–100.0)
Monocytes Absolute: 0.7 10*3/uL (ref 0.1–1.0)
Monocytes Relative: 8 %
Neutro Abs: 4.7 10*3/uL (ref 1.7–7.7)
Neutrophils Relative %: 54 %
Platelets: 210 10*3/uL (ref 150–400)
RBC: 4.96 MIL/uL (ref 4.22–5.81)
RDW: 13.2 % (ref 11.5–15.5)
WBC: 8.8 10*3/uL (ref 4.0–10.5)
nRBC: 0 % (ref 0.0–0.2)

## 2018-07-30 LAB — COMPREHENSIVE METABOLIC PANEL
ALT: 44 U/L (ref 0–44)
AST: 34 U/L (ref 15–41)
Albumin: 4.5 g/dL (ref 3.5–5.0)
Alkaline Phosphatase: 104 U/L (ref 38–126)
Anion gap: 10 (ref 5–15)
BUN: 13 mg/dL (ref 6–20)
CO2: 24 mmol/L (ref 22–32)
Calcium: 9.1 mg/dL (ref 8.9–10.3)
Chloride: 106 mmol/L (ref 98–111)
Creatinine, Ser: 1.05 mg/dL (ref 0.61–1.24)
GFR calc Af Amer: >60 mL/min (ref >60)
GFR calc non Af Amer: >60 mL/min (ref >60)
Glucose, Bld: 104 mg/dL — ABNORMAL HIGH (ref 70–99)
Potassium: 4 mmol/L (ref 3.5–5.1)
Sodium: 140 mmol/L (ref 135–145)
Total Bilirubin: 0.6 mg/dL (ref 0.3–1.2)
Total Protein: 7.6 g/dL (ref 6.5–8.1)

## 2018-07-30 LAB — LIPASE, BLOOD: Lipase: 38 U/L (ref 11–51)

## 2018-07-30 MED ORDER — LIDOCAINE-EPINEPHRINE (PF) 2 %-1:200000 IJ SOLN
10.0000 mL | Freq: Once | INTRAMUSCULAR | Status: AC
  Administered 2018-07-30: 10 mL
  Filled 2018-07-30: qty 10

## 2018-07-30 MED ORDER — IOHEXOL 300 MG/ML  SOLN
100.0000 mL | Freq: Once | INTRAMUSCULAR | Status: AC | PRN
  Administered 2018-07-30: 100 mL via INTRAVENOUS

## 2018-07-30 MED ORDER — TETANUS-DIPHTH-ACELL PERTUSSIS 5-2.5-18.5 LF-MCG/0.5 IM SUSP
0.5000 mL | Freq: Once | INTRAMUSCULAR | Status: AC
  Administered 2018-07-30: 0.5 mL via INTRAMUSCULAR
  Filled 2018-07-30: qty 0.5

## 2018-07-30 MED ORDER — ACETAMINOPHEN 325 MG PO TABS
650.0000 mg | ORAL_TABLET | Freq: Once | ORAL | Status: AC
  Administered 2018-07-30: 650 mg via ORAL
  Filled 2018-07-30: qty 2

## 2018-07-30 NOTE — ED Notes (Signed)
Irrigated left little finger and forearm with NS, removed wedding ring and placed it in pts billfold per his instruction, given tetanus shot, and tylenol for pain. Supplies at bedside for suturing.
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
## 2018-07-31 LAB — SARS CORONAVIRUS 2 BY RT PCR (HOSPITAL ORDER, PERFORMED IN ~~LOC~~ HOSPITAL LAB): SARS Coronavirus 2: NEGATIVE

## 2018-07-31 MED ORDER — HYDROCODONE-ACETAMINOPHEN 5-325 MG PO TABS: 1.0000 | ORAL_TABLET | Freq: Four times a day (QID) | ORAL | 0 refills | Status: DC | PRN

## 2018-07-31 NOTE — Discharge Instructions (Addendum)
There were fractures noted to the left small finger as well as the left big toe. Acetaminophen: May take acetaminophen (generic for Tylenol), as needed, for pain. Your daily total maximum amount of acetaminophen from all sources should be limited to 4000mg /day for persons without liver problems, or 2000mg /day for those with liver problems. Vicodin: May take Vicodin (hydrocodone-acetaminophen) as needed for severe pain.   Do not drive or perform other dangerous activities while taking this medication as it can cause drowsiness as well as changes in reaction time and judgement.   Please note that each pill of Vicodin contains 325 mg of acetaminophen (generic for Tylenol) and the above dosage limits apply. Ice: May apply ice to the area over the next 24 hours for 15 minutes at a time to reduce swelling. Elevation: Keep the extremity elevated as often as possible to reduce pain and inflammation. Support: Keep the splints clean and dry. Follow up: For both the finger fracture and toe fracture, follow-up with the hand specialist listed.  Call to make an appointment. Return: Return to the ED for numbness, weakness, increasing pain, overall worsening symptoms, loss of function, or if symptoms are not improving, you have tried to follow up with the orthopedic specialist, and have been unable to do so.  For prescription assistance, may try using prescription discount sites or apps, such as goodrx.com  Wound Care - Laceration  Scar reduction: Application of a topical antibiotic ointment, such as Neosporin, after the wound has begun to close and heal well can decrease scab formation and reduce scarring. After the wound has healed and wound closures have been removed, application of ointments such as Aquaphor can also reduce scar formation.  The key to scar reduction is keeping the skin well hydrated and supple. Drinking plenty of water throughout the day (At least eight 8oz glasses of water a day) is essential  to staying well hydrated.  Sun exposure: Keep the wound out of the sun. After the wound has healed, continue to protect it from the sun by wearing protective clothing or applying sunscreen.  Pain: You may use Tylenol for pain.  Suture/staple removal: Suture should fall out on its own.  If it does not, may return in 10 days for suture removal.  Return to the ED sooner should the wound edges come apart or signs of infection arise, such as spreading redness, puffiness/swelling, pus draining from the wound, severe increase in pain, fever over 100.99F, or any other major issues.  For prescription assistance, may try using prescription discount sites or apps, such as goodrx.com

## 2018-08-01 ENCOUNTER — Encounter: Payer: Self-pay | Admitting: Orthopaedic Surgery

## 2018-08-01 ENCOUNTER — Ambulatory Visit (INDEPENDENT_AMBULATORY_CARE_PROVIDER_SITE_OTHER): Payer: BC Managed Care – PPO | Admitting: Orthopaedic Surgery

## 2018-08-01 ENCOUNTER — Other Ambulatory Visit: Payer: Self-pay

## 2018-08-01 DIAGNOSIS — S62617D Displaced fracture of proximal phalanx of left little finger, subsequent encounter for fracture with routine healing: Secondary | ICD-10-CM

## 2018-08-01 DIAGNOSIS — S92415A Nondisplaced fracture of proximal phalanx of left great toe, initial encounter for closed fracture: Secondary | ICD-10-CM

## 2018-08-01 HISTORY — DX: Displaced fracture of proximal phalanx of left little finger, subsequent encounter for fracture with routine healing: S62.617D

## 2018-08-01 HISTORY — DX: Nondisplaced fracture of proximal phalanx of left great toe, initial encounter for closed fracture: S92.415A
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
## 2018-08-10 ENCOUNTER — Telehealth: Payer: Self-pay

## 2018-08-10 NOTE — Telephone Encounter (Signed)
FYI-  Patient called stating that he is having left hip pain, stated that it's not that bad and would address it with Dr. Erlinda Hong when in comes in for his 61.  Stated that his right elbow is locking up, but again will speak with Dr. Erlinda Hong at his [place]. Advised patient that if it gets any worse to call our office and make an appointment to be seen.  Patient voiced that he understands.

## 2018-08-10 NOTE — Telephone Encounter (Signed)
Your welcome.  Thank you.

## 2018-08-10 NOTE — Telephone Encounter (Signed)
Ok thanks 

## 2018-08-22 ENCOUNTER — Ambulatory Visit (INDEPENDENT_AMBULATORY_CARE_PROVIDER_SITE_OTHER): Payer: BC Managed Care – PPO | Admitting: Orthopaedic Surgery

## 2018-08-22 ENCOUNTER — Encounter: Payer: Self-pay | Admitting: Orthopaedic Surgery

## 2018-08-22 ENCOUNTER — Ambulatory Visit: Payer: Self-pay

## 2018-08-22 DIAGNOSIS — S92415A Nondisplaced fracture of proximal phalanx of left great toe, initial encounter for closed fracture: Secondary | ICD-10-CM | POA: Diagnosis not present
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
## 2018-09-12 ENCOUNTER — Other Ambulatory Visit: Payer: Self-pay | Admitting: Cardiology

## 2018-09-13 NOTE — Telephone Encounter (Signed)
This is a [place] pt °

## 2018-09-20 ENCOUNTER — Other Ambulatory Visit: Payer: Self-pay

## 2018-09-20 ENCOUNTER — Ambulatory Visit (INDEPENDENT_AMBULATORY_CARE_PROVIDER_SITE_OTHER): Payer: BC Managed Care – PPO | Admitting: Orthopaedic Surgery

## 2018-09-20 ENCOUNTER — Ambulatory Visit: Payer: Self-pay

## 2018-09-20 ENCOUNTER — Encounter: Payer: Self-pay | Admitting: Orthopaedic Surgery

## 2018-09-20 DIAGNOSIS — S62617D Displaced fracture of proximal phalanx of left little finger, subsequent encounter for fracture with routine healing: Secondary | ICD-10-CM | POA: Diagnosis not present

## 2018-09-20 DIAGNOSIS — M1612 Unilateral primary osteoarthritis, left hip: Secondary | ICD-10-CM

## 2018-09-20 HISTORY — DX: Unilateral primary osteoarthritis, left hip: M16.12
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
## 2018-09-22 ENCOUNTER — Telehealth: Payer: Self-pay | Admitting: Orthopaedic Surgery

## 2018-09-22 NOTE — Telephone Encounter (Signed)
Ivin Booty from [facility] [employer] left a voicemail requesting the last office notes and operative notes faxed to them at [phone].  Thank you.

## 2018-09-22 NOTE — Telephone Encounter (Signed)
Faxed to provided number  

## 2018-10-06 ENCOUNTER — Encounter: Payer: Self-pay | Admitting: Cardiology

## 2018-10-06 ENCOUNTER — Ambulatory Visit (INDEPENDENT_AMBULATORY_CARE_PROVIDER_SITE_OTHER): Payer: BC Managed Care – PPO | Admitting: Cardiology

## 2018-10-06 ENCOUNTER — Ambulatory Visit (INDEPENDENT_AMBULATORY_CARE_PROVIDER_SITE_OTHER): Payer: BC Managed Care – PPO | Admitting: Orthopaedic Surgery

## 2018-10-06 ENCOUNTER — Other Ambulatory Visit: Payer: Self-pay

## 2018-10-06 ENCOUNTER — Ambulatory Visit: Payer: Self-pay

## 2018-10-06 ENCOUNTER — Encounter: Payer: Self-pay | Admitting: Emergency Medicine

## 2018-10-06 VITALS — BP 136/74 | HR 86 | Wt 279.0 lb

## 2018-10-06 DIAGNOSIS — G4733 Obstructive sleep apnea (adult) (pediatric): Secondary | ICD-10-CM | POA: Diagnosis not present

## 2018-10-06 DIAGNOSIS — S62617D Displaced fracture of proximal phalanx of left little finger, subsequent encounter for fracture with routine healing: Secondary | ICD-10-CM | POA: Diagnosis not present

## 2018-10-06 DIAGNOSIS — M1612 Unilateral primary osteoarthritis, left hip: Secondary | ICD-10-CM

## 2018-10-06 DIAGNOSIS — E782 Mixed hyperlipidemia: Secondary | ICD-10-CM

## 2018-10-06 DIAGNOSIS — I2510 Atherosclerotic heart disease of native coronary artery without angina pectoris: Secondary | ICD-10-CM

## 2018-10-06 DIAGNOSIS — Z9989 Dependence on other enabling machines and devices: Secondary | ICD-10-CM

## 2018-10-06 DIAGNOSIS — I252 Old myocardial infarction: Secondary | ICD-10-CM | POA: Diagnosis not present
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
## 2018-10-06 NOTE — Patient Instructions (Signed)
Medication Instructions:  Your physician recommends that you continue on your current medications as directed. Please refer to the Current Medication list given to you today.  If you need a refill on your cardiac medications before your next appointment, please call your pharmacy.   Lab work: None.  If you have labs (blood work) drawn today and your tests are completely normal, you will receive your results only by: . MyChart Message (if you have MyChart) OR . A paper copy in the mail If you have any lab test that is abnormal or we need to change your treatment, we will call you to review the results.  Testing/Procedures: None.   Follow-Up: At [facility], you and your health needs are our priority.  As part of our continuing mission to provide you with exceptional heart care, we have created designated Provider Care Teams.  These Care Teams include your primary Cardiologist (physician) and Advanced Practice Providers (APPs -  Physician Assistants and Nurse Practitioners) who all work together to provide you with the care you need, when you need it. You will need a follow up appointment in 5 months.  Please call our office 2 months in advance to schedule this appointment.  You may see Robert Krasowski, MD or another member of our [facility] Provider Team in [place]: Brian Munley, MD . Rajan Revankar, MD  Any Other Special Instructions Will Be Listed Below (If Applicable).     

## 2018-10-06 NOTE — Addendum Note (Signed)
Addended by: Ashok Norris on: 10/06/2018 04:53 PM   Modules accepted: Orders
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
## 2018-10-09 ENCOUNTER — Telehealth: Payer: Self-pay | Admitting: Orthopaedic Surgery

## 2018-10-09 NOTE — Telephone Encounter (Signed)
Received voicemail message from [place] with [employer] needing a call back concerning the attending physician statement that was faxed over 10/02/2018. The number to contact Fara Olden is [phone]

## 2018-10-10 NOTE — Telephone Encounter (Signed)
Tried calling. LM for Fara Olden to [facility] to discuss.

## 2018-10-16 ENCOUNTER — Telehealth: Payer: Self-pay | Admitting: Orthopaedic Surgery

## 2018-10-16 NOTE — Telephone Encounter (Signed)
See message.

## 2018-10-16 NOTE — Telephone Encounter (Signed)
Called patient no answer LMOM.

## 2018-10-16 NOTE — Telephone Encounter (Signed)
Patient called stating that he is suppose to be having hip surgery, which has not been scheduled yet, he would like to know if he could go ahead and get a flu shot.  LT#[phone].  Thank you.

## 2018-10-16 NOTE — Telephone Encounter (Signed)
Will not have any effect on the surgery.

## 2018-10-24 ENCOUNTER — Encounter: Payer: Self-pay | Admitting: Orthopaedic Surgery

## 2018-10-25 ENCOUNTER — Other Ambulatory Visit: Payer: Self-pay

## 2018-10-25 ENCOUNTER — Encounter (HOSPITAL_BASED_OUTPATIENT_CLINIC_OR_DEPARTMENT_OTHER): Payer: Self-pay

## 2018-10-25 NOTE — Progress Notes (Signed)
Chart reviewed with Dr. Doroteo Glassman. Ok to proceed with surgery as scheduled.

## 2018-10-26 NOTE — Progress Notes (Signed)

## 2018-10-28 ENCOUNTER — Other Ambulatory Visit (HOSPITAL_COMMUNITY)
Admission: RE | Admit: 2018-10-28 | Discharge: 2018-10-28 | Disposition: A | Discharge status: Home / Self Care | Payer: BC Managed Care – PPO | Source: Ambulatory Visit | Attending: Orthopaedic Surgery | Admitting: Orthopaedic Surgery

## 2018-10-28 DIAGNOSIS — Z01812 Encounter for preprocedural laboratory examination: Secondary | ICD-10-CM | POA: Insufficient documentation

## 2018-10-28 DIAGNOSIS — Z20828 Contact with and (suspected) exposure to other viral communicable diseases: Secondary | ICD-10-CM | POA: Insufficient documentation

## 2018-10-29 LAB — NOVEL CORONAVIRUS, NAA (HOSP ORDER, SEND-OUT TO REF LAB; TAT 18-24 HRS): SARS-CoV-2, NAA: NOT DETECTED

## 2018-10-30 ENCOUNTER — Ambulatory Visit
Admission: RE | Admit: 2018-10-30 | Discharge: 2018-10-30 | Disposition: A | Discharge status: Home / Self Care | Payer: BC Managed Care – PPO | Source: Ambulatory Visit | Attending: Orthopaedic Surgery | Admitting: Orthopaedic Surgery

## 2018-10-30 ENCOUNTER — Other Ambulatory Visit: Payer: Self-pay | Admitting: Radiology

## 2018-10-30 ENCOUNTER — Other Ambulatory Visit: Payer: Self-pay | Admitting: Orthopaedic Surgery

## 2018-10-30 DIAGNOSIS — M25742 Osteophyte, left hand: Secondary | ICD-10-CM

## 2018-10-31 ENCOUNTER — Telehealth: Payer: Self-pay | Admitting: *Deleted

## 2018-10-31 NOTE — Telephone Encounter (Signed)
-----   Message from Leandrew Koyanagi, MD sent at 10/30/2018  9:15 AM EDT ----- Regarding: RE: STAT CT scan Thanks ----- Message ----- From: Maureen Chatters, CMA Sent: 10/30/2018   8:29 AM EDT To: Leandrew Koyanagi, MD Subject: RE: STAT CT scan                               I just put in the order for CT left small finger. I sent the order to [facility] imaging as a STAT, hopefully someone will contact pt and get him scheduled. I will keep eye out to double check.   Gabriel Cirri ----- Message ----- From: Leandrew Koyanagi, MD Sent: 10/29/2018   9:52 PM EDT To: Laurann Montana, RT, Maureen Chatters, CMA, # Subject: STAT CT scan                                   Hi ladies, Not sure who is in clinic tomorrow.  I need to get a STAT CT scan w/o contrast on his left small finger before his surgery wed please.  Thanks.

## 2018-11-01 ENCOUNTER — Encounter (HOSPITAL_BASED_OUTPATIENT_CLINIC_OR_DEPARTMENT_OTHER): Payer: Self-pay | Admitting: Anesthesiology

## 2018-11-01 ENCOUNTER — Ambulatory Visit (HOSPITAL_BASED_OUTPATIENT_CLINIC_OR_DEPARTMENT_OTHER): Payer: BC Managed Care – PPO | Admitting: Anesthesiology

## 2018-11-01 ENCOUNTER — Other Ambulatory Visit: Payer: Self-pay

## 2018-11-01 ENCOUNTER — Ambulatory Visit (HOSPITAL_BASED_OUTPATIENT_CLINIC_OR_DEPARTMENT_OTHER)
Admission: RE | Admit: 2018-11-01 | Discharge: 2018-11-01 | Disposition: A | Discharge status: Home / Self Care | Payer: BC Managed Care – PPO | Attending: Orthopaedic Surgery | Admitting: Orthopaedic Surgery

## 2018-11-01 ENCOUNTER — Encounter (HOSPITAL_BASED_OUTPATIENT_CLINIC_OR_DEPARTMENT_OTHER)
Admission: RE | Disposition: A | Discharge status: Home / Self Care | Payer: Self-pay | Source: Home / Self Care | Attending: Orthopaedic Surgery

## 2018-11-01 DIAGNOSIS — Z87891 Personal history of nicotine dependence: Secondary | ICD-10-CM | POA: Diagnosis not present

## 2018-11-01 DIAGNOSIS — Z880 Allergy status to penicillin: Secondary | ICD-10-CM | POA: Diagnosis not present

## 2018-11-01 DIAGNOSIS — Z6839 Body mass index (BMI) 39.0-39.9, adult: Secondary | ICD-10-CM | POA: Diagnosis not present

## 2018-11-01 DIAGNOSIS — Z955 Presence of coronary angioplasty implant and graft: Secondary | ICD-10-CM | POA: Insufficient documentation

## 2018-11-01 DIAGNOSIS — M1612 Unilateral primary osteoarthritis, left hip: Secondary | ICD-10-CM | POA: Diagnosis not present

## 2018-11-01 DIAGNOSIS — S62617D Displaced fracture of proximal phalanx of left little finger, subsequent encounter for fracture with routine healing: Secondary | ICD-10-CM | POA: Diagnosis not present

## 2018-11-01 DIAGNOSIS — Z79899 Other long term (current) drug therapy: Secondary | ICD-10-CM | POA: Diagnosis not present

## 2018-11-01 DIAGNOSIS — S62617P Displaced fracture of proximal phalanx of left little finger, subsequent encounter for fracture with malunion: Secondary | ICD-10-CM | POA: Diagnosis present

## 2018-11-01 DIAGNOSIS — G4733 Obstructive sleep apnea (adult) (pediatric): Secondary | ICD-10-CM | POA: Insufficient documentation

## 2018-11-01 DIAGNOSIS — E7800 Pure hypercholesterolemia, unspecified: Secondary | ICD-10-CM | POA: Insufficient documentation

## 2018-11-01 DIAGNOSIS — Z7982 Long term (current) use of aspirin: Secondary | ICD-10-CM | POA: Diagnosis not present

## 2018-11-01 DIAGNOSIS — E782 Mixed hyperlipidemia: Secondary | ICD-10-CM | POA: Diagnosis not present

## 2018-11-01 DIAGNOSIS — I252 Old myocardial infarction: Secondary | ICD-10-CM | POA: Insufficient documentation

## 2018-11-01 DIAGNOSIS — X58XXXD Exposure to other specified factors, subsequent encounter: Secondary | ICD-10-CM | POA: Insufficient documentation

## 2018-11-01 DIAGNOSIS — Z7902 Long term (current) use of antithrombotics/antiplatelets: Secondary | ICD-10-CM | POA: Diagnosis not present

## 2018-11-01 DIAGNOSIS — I2510 Atherosclerotic heart disease of native coronary artery without angina pectoris: Secondary | ICD-10-CM | POA: Insufficient documentation

## 2018-11-01 HISTORY — PX: METACARPAL OSTEOTOMY: SHX5036

## 2018-11-01 SURGERY — OSTEOTOMY, METACARPAL BONE
Anesthesia: Monitor Anesthesia Care | Site: Finger | Laterality: Left

## 2018-11-01 MED ORDER — OXYCODONE HCL 5 MG/5ML PO SOLN: 5.0000 mg | Freq: Once | ORAL | Status: DC | PRN

## 2018-11-01 MED ORDER — HYDROCODONE-ACETAMINOPHEN 5-325 MG PO TABS: 1.0000 | ORAL_TABLET | Freq: Three times a day (TID) | ORAL | 0 refills | Status: DC | PRN

## 2018-11-01 MED ORDER — ONDANSETRON HCL 4 MG/2ML IJ SOLN: 4.0000 mg | Freq: Once | INTRAMUSCULAR | Status: DC | PRN

## 2018-11-01 MED ORDER — MIDAZOLAM HCL 2 MG/2ML IJ SOLN
1.0000 mg | INTRAMUSCULAR | Status: DC | PRN
  Administered 2018-11-01: 11:00:00 2 mg via INTRAVENOUS

## 2018-11-01 MED ORDER — FENTANYL CITRATE (PF) 100 MCG/2ML IJ SOLN: 25.0000 ug | INTRAMUSCULAR | Status: DC | PRN

## 2018-11-01 MED ORDER — CLINDAMYCIN PHOSPHATE 900 MG/50ML IV SOLN
INTRAVENOUS | Status: AC
  Filled 2018-11-01: qty 50

## 2018-11-01 MED ORDER — CHLORHEXIDINE GLUCONATE 4 % EX LIQD: 60.0000 mL | Freq: Once | CUTANEOUS | Status: DC

## 2018-11-01 MED ORDER — LACTATED RINGERS IV SOLN
INTRAVENOUS | Status: DC
  Administered 2018-11-01: 10:00:00 via INTRAVENOUS

## 2018-11-01 MED ORDER — ONDANSETRON HCL 4 MG/2ML IJ SOLN
INTRAMUSCULAR | Status: DC | PRN
  Administered 2018-11-01: 4 mg via INTRAVENOUS

## 2018-11-01 MED ORDER — CLINDAMYCIN PHOSPHATE 900 MG/50ML IV SOLN
900.0000 mg | INTRAVENOUS | Status: AC
  Administered 2018-11-01: 11:00:00 900 mg via INTRAVENOUS

## 2018-11-01 MED ORDER — FENTANYL CITRATE (PF) 100 MCG/2ML IJ SOLN
INTRAMUSCULAR | Status: AC
  Filled 2018-11-01: qty 2

## 2018-11-01 MED ORDER — LIDOCAINE 2% (20 MG/ML) 5 ML SYRINGE
INTRAMUSCULAR | Status: DC | PRN
  Administered 2018-11-01: 25 mg via INTRAVENOUS

## 2018-11-01 MED ORDER — FENTANYL CITRATE (PF) 100 MCG/2ML IJ SOLN
50.0000 ug | INTRAMUSCULAR | Status: DC | PRN
  Administered 2018-11-01: 100 ug via INTRAVENOUS

## 2018-11-01 MED ORDER — SCOPOLAMINE 1 MG/3DAYS TD PT72: 1.0000 | MEDICATED_PATCH | Freq: Once | TRANSDERMAL | Status: DC

## 2018-11-01 MED ORDER — PROPOFOL 10 MG/ML IV BOLUS
INTRAVENOUS | Status: DC | PRN
  Administered 2018-11-01: 200 mg via INTRAVENOUS

## 2018-11-01 MED ORDER — MIDAZOLAM HCL 2 MG/2ML IJ SOLN
INTRAMUSCULAR | Status: AC
  Filled 2018-11-01: qty 2

## 2018-11-01 MED ORDER — ONDANSETRON HCL 4 MG PO TABS: 4.0000 mg | ORAL_TABLET | Freq: Three times a day (TID) | ORAL | 0 refills | Status: DC | PRN

## 2018-11-01 MED ORDER — OXYCODONE HCL 5 MG PO TABS: 5.0000 mg | ORAL_TABLET | Freq: Once | ORAL | Status: DC | PRN

## 2018-11-01 SURGICAL SUPPLY — 58 items
ADH SKN CLS APL DERMABOND .7 (GAUZE/BANDAGES/DRESSINGS)
BAND INSRT 18 STRL LF DISP RB (MISCELLANEOUS) ×1
BAND RUBBER #18 3X1/16 STRL (MISCELLANEOUS) ×3 IMPLANT
BLADE ARTHRO LOK 4 BEAVER (BLADE) IMPLANT
BLADE ARTHRO LOK 4MM BEAVER (BLADE)
BLADE SURG 15 STRL LF DISP TIS (BLADE) ×2 IMPLANT
BLADE SURG 15 STRL SS (BLADE) ×6
BNDG CMPR 9X4 STRL LF SNTH (GAUZE/BANDAGES/DRESSINGS) ×1
BNDG COHESIVE 1X5 TAN STRL LF (GAUZE/BANDAGES/DRESSINGS) IMPLANT
BNDG CONFORM 2 STRL LF (GAUZE/BANDAGES/DRESSINGS) ×3 IMPLANT
BNDG ELASTIC 3X5.8 VLCR STR LF (GAUZE/BANDAGES/DRESSINGS) IMPLANT
BNDG ESMARK 4X9 LF (GAUZE/BANDAGES/DRESSINGS) ×3 IMPLANT
BRUSH SCRUB EZ PLAIN DRY (MISCELLANEOUS) ×3 IMPLANT
CORD BIPOLAR FORCEPS 12FT (ELECTRODE) ×3 IMPLANT
COVER BACK TABLE REUSABLE LG (DRAPES) ×3 IMPLANT
COVER MAYO STAND REUSABLE (DRAPES) ×3 IMPLANT
COVER WAND RF STERILE (DRAPES) IMPLANT
CUFF TOURN SGL QUICK 18X4 (TOURNIQUET CUFF) IMPLANT
DECANTER SPIKE VIAL GLASS SM (MISCELLANEOUS) IMPLANT
DERMABOND ADVANCED (GAUZE/BANDAGES/DRESSINGS)
DERMABOND ADVANCED .7 DNX12 (GAUZE/BANDAGES/DRESSINGS) IMPLANT
DRAPE EXTREMITY T 121X128X90 (DISPOSABLE) ×3 IMPLANT
DRAPE IMP U-DRAPE 54X76 (DRAPES) ×3 IMPLANT
DRAPE SURG 17X23 STRL (DRAPES) ×3 IMPLANT
GAUZE 4X4 16PLY RFD (DISPOSABLE) IMPLANT
GAUZE SPONGE 4X4 12PLY STRL (GAUZE/BANDAGES/DRESSINGS) ×3 IMPLANT
GAUZE XEROFORM 1X8 LF (GAUZE/BANDAGES/DRESSINGS) ×3 IMPLANT
GLOVE BIO SURGEON STRL SZ7 (GLOVE) ×2 IMPLANT
GLOVE BIOGEL PI IND STRL 7.0 (GLOVE) ×1 IMPLANT
GLOVE BIOGEL PI IND STRL 7.5 (GLOVE) IMPLANT
GLOVE BIOGEL PI INDICATOR 7.0 (GLOVE) ×2
GLOVE BIOGEL PI INDICATOR 7.5 (GLOVE) ×4
GLOVE ECLIPSE 7.0 STRL STRAW (GLOVE) ×3 IMPLANT
GLOVE SKINSENSE NS SZ7.5 (GLOVE) ×2
GLOVE SKINSENSE STRL SZ7.5 (GLOVE) ×1 IMPLANT
GLOVE SURG SYN 7.5  E (GLOVE) ×4
GLOVE SURG SYN 7.5 E (GLOVE) ×2 IMPLANT
GLOVE SURG SYN 7.5 PF PI (GLOVE) ×2 IMPLANT
GOWN STRL REIN XL XLG (GOWN DISPOSABLE) ×3 IMPLANT
GOWN STRL REUS W/ TWL LRG LVL3 (GOWN DISPOSABLE) ×1 IMPLANT
GOWN STRL REUS W/ TWL XL LVL3 (GOWN DISPOSABLE) ×2 IMPLANT
GOWN STRL REUS W/TWL LRG LVL3 (GOWN DISPOSABLE) ×3
GOWN STRL REUS W/TWL XL LVL3 (GOWN DISPOSABLE) ×6
NDL HYPO 25X1 1.5 SAFETY (NEEDLE) IMPLANT
NEEDLE HYPO 25X1 1.5 SAFETY (NEEDLE) IMPLANT
NS IRRIG 1000ML POUR BTL (IV SOLUTION) ×2 IMPLANT
PACK BASIN DAY SURGERY FS (CUSTOM PROCEDURE TRAY) ×3 IMPLANT
STOCKINETTE 4X48 STRL (DRAPES) IMPLANT
SUT ETHILON 2 0 FS 18 (SUTURE) IMPLANT
SUT ETHILON 4 0 PS 2 18 (SUTURE) ×3 IMPLANT
SUT VIC AB 0 CT1 27 (SUTURE)
SUT VIC AB 0 CT1 27XBRD ANBCTR (SUTURE) IMPLANT
SUT VIC AB 2-0 CT1 27 (SUTURE)
SUT VIC AB 2-0 CT1 TAPERPNT 27 (SUTURE) IMPLANT
SYR BULB 3OZ (MISCELLANEOUS) IMPLANT
SYR CONTROL 10ML LL (SYRINGE) IMPLANT
TOWEL GREEN STERILE FF (TOWEL DISPOSABLE) ×3 IMPLANT
TRAY DSU PREP LF (CUSTOM PROCEDURE TRAY) ×3 IMPLANT

## 2018-11-01 NOTE — Op Note (Signed)
   Date of Surgery: 11/01/2018  INDICATIONS: Mr. Otten is a 58 y.o.-year-old male with a symptomatic malunion of left small finger proximal phalanx fracture;  The patient did consent to the procedure after discussion of the risks and benefits.  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  PROCEDURE: Osteotomy of proximal phalanx of left small finger  SURGEON: N. Eduard Roux, M.D.  ASSIST: Ciro Backer [place], [place]; necessary for the timely completion of procedure and due to complexity of procedure.  ANESTHESIA:  general, axillary block  IV FLUIDS AND URINE: See anesthesia.  ESTIMATED BLOOD LOSS: minimal mL.  IMPLANTS: none  DRAINS: noen  COMPLICATIONS: see description of procedure.  DESCRIPTION OF PROCEDURE: The patient was brought to the operating room and placed supine on the operating table.  The patient had been signed prior to the procedure and this was documented. The patient had the anesthesia placed by the anesthesiologist.  A time-out was performed to confirm that this was the correct patient, site, side and location. The patient did receive antibiotics prior to the incision and was re-dosed during the procedure as needed at indicated intervals.  A tourniquet was placed.  The patient had the operative extremity prepped and draped in the standard surgical fashion.    A mid axial incision was made on the ulnar side of the left small finger.  Dissection was performed dorsal to the neurovascular bundles.  Retractors were placed.  The extensor hood was then visualized and this was sharply elevated off of the proximal phalanx volarly with a Beaver blade.  The flexor tendon and the flexor tendon sheath were retracted radially and the malunion of the proximal phalanx was encountered.  I took the small finger through a range of motion and could see that the FDS and FDP tendons were in continuity.  The prominence  on the volar cortex due to the malunion was identified and osteotomy of the volar cortex was performed with a rondure.  This was then smoothed out with a rasp.  I then checked the range of motion again to make sure that the tendon glided smoothly over the volar cortex of the proximal phalanx.  The tourniquet was then deflated and hemostasis was obtained.  This was thoroughly irrigated with normal saline.  The extensor hood was closed with interrupted 2-0 Vicryl.  The skin was closed with a 4-0 nylon.  Sterile dressings were applied.  Patient tolerated the procedure well had no immediate complications.  POSTOPERATIVE PLAN: Discharge home.  Begin hand therapy in 2 days.  Follow-up in 1 week.  Azucena Cecil, MD 11:48 AM

## 2018-11-01 NOTE — Anesthesia Procedure Notes (Signed)
Procedure Name: LMA Insertion Date/Time: 11/01/2018 11:19 AM Performed by: Lieutenant Diego, CRNA Pre-anesthesia Checklist: Patient identified, Emergency Drugs available, Suction available and Patient being monitored Patient Re-evaluated:Patient Re-evaluated prior to induction Oxygen Delivery Method: Circle system utilized Preoxygenation: Pre-oxygenation with 100% oxygen Induction Type: IV induction Ventilation: Mask ventilation without difficulty LMA: LMA inserted LMA Size: 5.0 Number of attempts: 1 Placement Confirmation: positive ETCO2 and breath sounds checked- equal and bilateral Tube secured with: Tape Dental Injury: Teeth and Oropharynx as per pre-operative assessment

## 2018-11-01 NOTE — Anesthesia Postprocedure Evaluation (Signed)
Anesthesia Post Note  Patient: Wayne Jordan  Procedure(s) Performed: Left small finger osteophyte removal (Left Finger)     Patient location during evaluation: PACU Anesthesia Type: Regional and General Level of consciousness: awake and alert Pain management: pain level controlled Vital Signs Assessment: post-procedure vital signs reviewed and stable Respiratory status: spontaneous breathing, nonlabored ventilation, respiratory function stable and patient connected to nasal cannula oxygen Cardiovascular status: blood pressure returned to baseline and stable Postop Assessment: no apparent nausea or vomiting Anesthetic complications: no    Last Vitals:  Vitals:   11/01/18 1201 11/01/18 1215  BP:  135/87  Pulse: 71 67  Resp: 18 15  Temp:    SpO2: 96% 98%    Last Pain:  Vitals:   11/01/18 1200  TempSrc:   PainSc: Asleep                 Eleanora Guinyard COKER

## 2018-11-01 NOTE — Anesthesia Procedure Notes (Signed)
Anesthesia Regional Block: Axillary brachial plexus block   Pre-Anesthetic Checklist: ,, timeout performed, Correct Patient, Correct Site, Correct Laterality, Correct Procedure,, site marked, risks and benefits discussed, Surgical consent,  Pre-op evaluation,  At surgeon's request and post-op pain management  Laterality: Left  Prep: chloraprep       Needles:  Injection technique: Single-shot  Needle Type: Stimulator Needle - 40          Additional Needles:   Narrative:  Start time: 11/01/2018 10:30 AM End time: 11/01/2018 10:40 AM Injection made incrementally with aspirations every 5 mL.  Performed by: Personally   Additional Notes: 30 cc 2% lidocaine with 3 cc 4.2% NaHCO3 injected easily

## 2018-11-01 NOTE — Anesthesia Preprocedure Evaluation (Signed)
Anesthesia Evaluation  Patient identified by MRN, date of birth, ID band Patient awake    Reviewed: Allergy & Precautions, NPO status , Patient's Chart, lab work & pertinent test results  Airway Mallampati: II  TM Distance: >3 FB Neck ROM: Full    Dental  (+) Teeth Intact, Dental Advisory Given   Pulmonary former smoker,    breath sounds clear to auscultation       Cardiovascular  Rhythm:Regular Rate:Normal     Neuro/Psych    GI/Hepatic   Endo/Other    Renal/GU      Musculoskeletal   Abdominal (+) + obese,   Peds  Hematology   Anesthesia Other Findings   Reproductive/Obstetrics                             Anesthesia Physical Anesthesia Plan  ASA: III  Anesthesia Plan: MAC   Post-op Pain Management:  Regional for Post-op pain   Induction:   PONV Risk Score and Plan: Ondansetron and Dexamethasone  Airway Management Planned: Natural Airway  Additional Equipment:   Intra-op Plan:   Post-operative Plan:   Informed Consent: I have reviewed the patients History and Physical, chart, labs and discussed the procedure including the risks, benefits and alternatives for the proposed anesthesia with the patient or authorized representative who has indicated his/her understanding and acceptance.     Dental advisory given  Plan Discussed with: CRNA and Anesthesiologist  Anesthesia Plan Comments:         Anesthesia Quick Evaluation

## 2018-11-01 NOTE — Discharge Instructions (Signed)
Postoperative instructions:  Weightbearing instructions: as tolerated.  Begin range of motion immediately  Keep your dressing and/or splint clean and dry at all times.  You can remove your dressing on post-operative day #3 and change with a dry/sterile dressing or Band-Aids as needed thereafter.    Incision instructions:  Do not soak your incision for 3 weeks after surgery.  If the incision gets wet, pat dry and do not scrub the incision.  Pain control:  You have been given a prescription to be taken as directed for post-operative pain control.  In addition, elevate the operative extremity above the heart at all times to prevent swelling and throbbing pain.  Take over-the-counter Colace, 100mg  by mouth twice a day while taking narcotic pain medications to help prevent constipation.  Follow up appointments: 1) 7 days for suture removal and wound check. 2) Dr. as scheduled.   -------------------------------------------------------------------------------------------------------------  After Surgery Pain Control:  After your surgery, post-surgical discomfort or pain is likely. This discomfort can last several days to a few weeks. At certain times of the day your discomfort may be more intense.  Did you receive a nerve block?  A nerve block can provide pain relief for one hour to two days after your surgery. As long as the nerve block is working, you will experience little or no sensation in the area the surgeon operated on.  As the nerve block wears off, you will begin to experience pain or discomfort. It is very important that you begin taking your prescribed pain medication before the nerve block fully wears off. Treating your pain at the first sign of the block wearing off will ensure your pain is better controlled and more tolerable when full-sensation returns. Do not wait until the pain is intolerable, as the medicine will be less effective. It is better to treat pain in advance than  to try and catch up.  General Anesthesia:  If you did not receive a nerve block during your surgery, you will need to start taking your pain medication shortly after your surgery and should continue to do so as prescribed by your surgeon.  Pain Medication:  Most commonly we prescribe Vicodin and Percocet for post-operative pain. Both of these medications contain a combination of acetaminophen (Tylenol) and a narcotic to help control pain.   It takes between 30 and 45 minutes before pain medication starts to work. It is important to take your medication before your pain level gets too intense.   Nausea is a common side effect of many pain medications. You will want to eat something before taking your pain medicine to help prevent nausea.   If you are taking a prescription pain medication that contains acetaminophen, we recommend that you do not take additional over the counter acetaminophen (Tylenol).  Other pain relieving options:   Using a cold pack to ice the affected area a few times a day (15 to 20 minutes at a time) can help to relieve pain, reduce swelling and bruising.   Elevation of the affected area can also help to reduce pain and swelling.     Post Anesthesia Home Care Instructions  Activity: Get plenty of rest for the remainder of the day. A responsible individual must stay with you for 24 hours following the procedure.  For the next 24 hours, DO NOT: -Drive a car -Roda Shutters -Drink alcoholic beverages -Take any medication unless instructed by your physician -Make any legal decisions or sign important papers.  Meals: Start  with liquid foods such as gelatin or soup. Progress to regular foods as tolerated. Avoid greasy, spicy, heavy foods. If nausea and/or vomiting occur, drink only clear liquids until the nausea and/or vomiting subsides. Call your physician if vomiting continues.  Special Instructions/Symptoms: Your throat may feel dry or sore from the  anesthesia or the breathing tube placed in your throat during surgery. If this causes discomfort, gargle with warm salt water. The discomfort should disappear within 24 hours.  If you had a scopolamine patch placed behind your ear for the management of post- operative nausea and/or vomiting:  1. The medication in the patch is effective for 72 hours, after which it should be removed.  Wrap patch in a tissue and discard in the trash. Wash hands thoroughly with soap and water. 2. You may remove the patch earlier than 72 hours if you experience unpleasant side effects which may include dry mouth, dizziness or visual disturbances. 3. Avoid touching the patch. Wash your hands with soap and water after contact with the patch.      Regional Anesthesia Blocks  1. Numbness or the inability to move the "blocked" extremity may last from 3-48 hours after placement. The length of time depends on the medication injected and your individual response to the medication. If the numbness is not going away after 48 hours, call your surgeon.  2. The extremity that is blocked will need to be protected until the numbness is gone and the  Strength has returned. Because you cannot feel it, you will need to take extra care to avoid injury. Because it may be weak, you may have difficulty moving it or using it. You may not know what position it is in without looking at it while the block is in effect.  3. For blocks in the legs and feet, returning to weight bearing and walking needs to be done carefully. You will need to wait until the numbness is entirely gone and the strength has returned. You should be able to move your leg and foot normally before you try and bear weight or walk. You will need someone to be with you when you first try to ensure you do not fall and possibly risk injury.  4. Bruising and tenderness at the needle site are common side effects and will resolve in a few days.  5. Persistent numbness or new  problems with movement should be communicated to the surgeon or the [place] [phone] [place] [phone]).

## 2018-11-01 NOTE — Progress Notes (Signed)
Assisted Dr Joslin with left, ultrasound guided, axillary block. Side rails up, monitors on throughout procedure. See vital signs in flow sheet. Tolerated Procedure well. 

## 2018-11-01 NOTE — Transfer of Care (Signed)
Immediate Anesthesia Transfer of Care Note  Patient: Wayne Jordan  Procedure(s) Performed: Left small finger osteophyte removal (Left Finger)  Patient Location: PACU  Anesthesia Type:General  Level of Consciousness: drowsy  Airway & Oxygen Therapy: Patient Spontanous Breathing and Patient connected to face mask oxygen  Post-op Assessment: Report given to RN and Post -op Vital signs reviewed and stable  Post vital signs: Reviewed and stable  Last Vitals:  Vitals Value Taken Time  BP 125/85 11/01/18 1159  Temp    Pulse 72 11/01/18 1200  Resp 16 11/01/18 1200  SpO2 96 % 11/01/18 1200  Vitals shown include unvalidated device data.  Last Pain:  Vitals:   11/01/18 1040  TempSrc:   PainSc: 0-No pain      Patients Stated Pain Goal: 3 (00/17/49 4496)  Complications: No apparent anesthesia complications

## 2018-11-01 NOTE — H&P (Signed)
PREOPERATIVE H&P  Chief Complaint: left small finger osteophyte  HPI: Wayne Jordan is a 58 y.o. male who presents for surgical treatment of left small finger osteophyte.  He denies any changes in medical history.  Past Medical History:  Diagnosis Date  . 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 Coronary artery disease    s/P PCI of the LAD with DES.  Nuclear stress test 04/2014 with no ischemia  . Hypercholesteremia   . Hyperlipidemia   . OSA on CPAP    followed by Dr. Andrew Au  . Personal history of urinary calculi   . 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    Past Surgical History:  Procedure Laterality Date  . INGUINAL HERNIA REPAIR Right 03/08/2012   Procedure: HERNIA REPAIR INGUINAL ADULT;  Surgeon: Ralene Ok, MD;  Location: [place];  Service: General;  Laterality: Right;  . INSERTION OF MESH N/A 03/08/2012   Procedure: INSERTION OF MESH;  Surgeon: Ralene Ok, MD;  Location: [place];  Service: General;  Laterality: N/A;  . LEFT HEART CATHETERIZATION WITH CORONARY ANGIOGRAM Right 10/07/2012   Procedure: LEFT HEART CATHETERIZATION WITH CORONARY ANGIOGRAM;  Surgeon: Jettie Booze, MD;  Location: [facility] CATH LAB;  Service: Cardiovascular;  Laterality: Right;  . PERCUTANEOUS STENT INTERVENTION  10/07/2012   Procedure: PERCUTANEOUS STENT INTERVENTION;  Surgeon: Jettie Booze, MD;  Location: [facility] CATH LAB;  Service: Cardiovascular;;   Social History   Socioeconomic History  . Marital status: Married    Spouse name: Not on file  . Number of children: Not on file  . Years of education: Not on file  . Highest education level: Not on file  Occupational History  . Not on file  Social Needs  . Financial resource strain: Not on file  . Food insecurity    Worry: Not on file    Inability: Not on file  . Transportation needs    Medical: Not on file    Non-medical: Not on file  Tobacco Use  . Smoking status: Former Smoker    Packs/day:  1.50    Years: 7.00    Pack years: 10.50    Types: Cigarettes    Quit date: 10/01/2012    Years since quitting: 6.0  . Smokeless tobacco: Never Used  Substance and Sexual Activity  . Alcohol use: Not Currently  . Drug use: No  . Sexual activity: Not on file  Lifestyle  . Physical activity    Days per week: Not on file    Minutes per session: Not on file  . Stress: Not on file  Relationships  . Social Herbalist on phone: Not on file    Gets together: Not on file    Attends religious service: Not on file    Active member of club or organization: Not on file    Attends meetings of clubs or organizations: Not on file    Relationship status: Not on file  Other Topics Concern  . Not on file  Social History Narrative   Drinks 2 cheerwines daily.   Family History  Problem Relation Age of Onset  . Heart disease Father   . Heart attack Father   . CAD Father    Allergies  Allergen Reactions  . Coconut Fatty Acids Shortness Of Breath and Swelling  . Penicillins Hives and Rash    Did it involve swelling of the face/tongue/throat, SOB, or low BP? Yes Did it  involve sudden or severe rash/hives, skin peeling, or any reaction on the inside of your mouth or nose? No Did you need to seek medical attention at a hospital or doctor's office? No When did it last happen?unknown  If all above answers are "NO", may proceed with cephalosporin use.    Prior to Admission medications   Medication Sig Start Date End Date Taking? Authorizing Provider  ascorbic acid (VITAMIN C) 500 MG tablet Take 500 mg by mouth daily.   Yes [provider]  aspirin 81 MG chewable tablet Chew 81 mg by mouth every evening.    Yes [provider]  atorvastatin (LIPITOR) 80 MG tablet Take 1 tablet (80 mg total) by mouth daily after breakfast. 09/13/18  Yes Georgeanna Lea, MD  clopidogrel (PLAVIX) 75 MG tablet TAKE 1 TABLET DAILY 09/13/18  Yes Georgeanna Lea, MD  metoprolol  tartrate (LOPRESSOR) 25 MG tablet TAKE 1 TABLET TWICE A DAY 09/13/18  Yes Georgeanna Lea, MD  nitroGLYCERIN (NITROSTAT) 0.4 MG SL tablet Place 1 tablet (0.4 mg total) under the tongue every 5 (five) minutes x 3 doses as needed for chest pain. 02/16/16   Quintella Reichert, MD     Positive ROS: All other systems have been reviewed and were otherwise negative with the exception of those mentioned in the HPI and as above.  Physical Exam: General: Alert, no acute distress Cardiovascular: No pedal edema Respiratory: No cyanosis, no use of accessory musculature GI: abdomen soft Skin: No lesions in the area of chief complaint Neurologic: Sensation intact distally Psychiatric: Patient is competent for consent with normal mood and affect Lymphatic: no lymphedema  MUSCULOSKELETAL: exam stable  Assessment: left small finger osteophyte  Plan: Plan for Procedure(s): Left small finger osteophyte removal  The risks benefits and alternatives were discussed with the patient including but not limited to the risks of nonoperative treatment, versus surgical intervention including infection, bleeding, nerve injury,  blood clots, cardiopulmonary complications, morbidity, mortality, among others, and they were willing to proceed.   Glee Arvin, MD   11/01/2018 7:06 AM

## 2018-11-02 ENCOUNTER — Encounter (HOSPITAL_BASED_OUTPATIENT_CLINIC_OR_DEPARTMENT_OTHER): Payer: Self-pay | Admitting: Orthopaedic Surgery

## 2018-11-08 ENCOUNTER — Other Ambulatory Visit: Payer: Self-pay

## 2018-11-08 ENCOUNTER — Ambulatory Visit (INDEPENDENT_AMBULATORY_CARE_PROVIDER_SITE_OTHER): Payer: BC Managed Care – PPO | Admitting: Physician Assistant

## 2018-11-08 DIAGNOSIS — M79645 Pain in left finger(s): Secondary | ICD-10-CM

## 2018-11-08 NOTE — Progress Notes (Signed)
Post-Op Visit Note   Patient: Wayne Jordan           Date of Birth: 02-01-1960           MRN: [id number] Visit Date: 11/08/2018 PCP: Maury Dus, MD   Assessment & Plan:  Chief Complaint:  Chief Complaint  Patient presents with  . Left Little Finger - Routine Post Op    11/01/2018 Left small finger osteophyte removal   Visit Diagnoses:  1. Pain in left finger(s)     Plan: Patient is a pleasant 58 year old gentleman who presents to our clinic today 1 week status post osteotomy proximal phalanx, left small finger, date of surgery 11/01/2018.  He has been doing well.  No fevers or chills.  No pain.  He has been in hand therapy.  Examination of the left small finger reveals a well-healing surgical incision with nylon sutures in place.  He does have moderate swelling.  No evidence of cellulitis or infection.  At this point, we will redress his wound.  He will avoid any submerging of his hand in water or heavy lifting for a total of 4 weeks after surgery.  He will continue with hand therapy.  He will follow-up with [place] next week for suture removal and discussion of a left total hip replacement with Dr. Sherrian Divers.    Follow-Up Instructions: Return in about 1 week (around 11/15/2018) for suture removal.   Orders:  No orders of the defined types were placed in this encounter.  No orders of the defined types were placed in this encounter.   Imaging: No new imaging   PMFS History: Patient Active Problem List   Diagnosis Date Noted  . Primary osteoarthritis of left hip 09/20/2018  . Nondisplaced fracture of proximal phalanx of left great toe, initial encounter for closed fracture 08/01/2018  . Displaced fracture of proximal phalanx of left little finger with routine healing 08/01/2018  . OSA on CPAP 05/26/2015  . Morbid obesity due to excess calories ([place]) 05/26/2015  . CAD- LAD DES in setting of NSTEMI Sept 2014 04/06/2013  . Mixed hyperlipidemia 04/06/2013  . Old MI (myocardial  infarction) 04/06/2013  . Encounter for long-term (current) use of other medications 04/06/2013  . Obesity, unspecified 10/08/2012   Past Medical History:  Diagnosis Date  . 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 Coronary artery disease    s/P PCI of the LAD with DES.  Nuclear stress test 04/2014 with no ischemia  . Hypercholesteremia   . Hyperlipidemia   . OSA on CPAP    followed by Dr. Andrew Au  . Personal history of urinary calculi   . 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     Family History  Problem Relation Age of Onset  . Heart disease Father   . Heart attack Father   . CAD Father     Past Surgical History:  Procedure Laterality Date  . INGUINAL HERNIA REPAIR Right 03/08/2012   Procedure: HERNIA REPAIR INGUINAL ADULT;  Surgeon: Ralene Ok, MD;  Location: [place];  Service: General;  Laterality: Right;  . INSERTION OF MESH N/A 03/08/2012   Procedure: INSERTION OF MESH;  Surgeon: Ralene Ok, MD;  Location: [place];  Service: General;  Laterality: N/A;  . LEFT HEART CATHETERIZATION WITH CORONARY ANGIOGRAM Right 10/07/2012   Procedure: LEFT HEART CATHETERIZATION WITH CORONARY ANGIOGRAM;  Surgeon: Jettie Booze, MD;  Location[facility] CATH LAB;  Service: Cardiovascular;  Laterality: Right;  .  METACARPAL OSTEOTOMY Left 11/01/2018   Procedure: Left small finger osteophyte removal;  Surgeon: Tarry Kos, MD;  Location: [facility];  Service: Orthopedics;  Laterality: Left;  . PERCUTANEOUS STENT INTERVENTION  10/07/2012   Procedure: PERCUTANEOUS STENT INTERVENTION;  Surgeon: Corky Crafts, MD;  Location: [facility] CATH LAB;  Service: Cardiovascular;;   Social History   Occupational History  . Not on file  Tobacco Use  . Smoking status: Former Smoker    Packs/day: 1.50    Years: 7.00    Pack years: 10.50    Types: Cigarettes    Quit date: 10/01/2012    Years since quitting: 6.1  . Smokeless tobacco: Never Used  Substance and  Sexual Activity  . Alcohol use: Not Currently  . Drug use: No  . Sexual activity: Not on file

## 2018-11-21 ENCOUNTER — Other Ambulatory Visit: Payer: Self-pay

## 2018-11-21 ENCOUNTER — Ambulatory Visit (INDEPENDENT_AMBULATORY_CARE_PROVIDER_SITE_OTHER): Payer: BC Managed Care – PPO | Admitting: Orthopaedic Surgery

## 2018-11-21 ENCOUNTER — Encounter: Payer: Self-pay | Admitting: Orthopaedic Surgery

## 2018-11-21 VITALS — Ht 71.0 in | Wt 280.0 lb

## 2018-11-21 DIAGNOSIS — M1611 Unilateral primary osteoarthritis, right hip: Secondary | ICD-10-CM

## 2018-11-21 DIAGNOSIS — S92415A Nondisplaced fracture of proximal phalanx of left great toe, initial encounter for closed fracture: Secondary | ICD-10-CM

## 2018-11-21 HISTORY — DX: Unilateral primary osteoarthritis, right hip: M16.11

## 2018-11-21 NOTE — Progress Notes (Signed)
Office Visit Note   Patient: Wayne Jordan           Date of Birth: [birth year]           MRN: [id number] Visit Date: 11/21/2018              Requested by: Elias Else, MD [phone] [address] [place],  [place] [address] PCP: Elias Else, MD   Assessment & Plan: Visit Diagnoses:  1. Nondisplaced fracture of proximal phalanx of left great toe, initial encounter for closed fracture   2. Primary osteoarthritis of right hip     Plan: In regards to the finger he will continue with hand therapy to improve range of motion function.  In terms of his left hip DJD he has failed conservative treatment and would like to proceed with a left total hip placement based on the discussion that we had today.  He will need to stop his Plavix preoperatively.  We will get preoperative cardiac clearance from Dr. Bing Matter.  We will tentatively schedule him for surgery on November 16.  Follow-Up Instructions: Return in about 6 weeks (around 01/02/2019).   Orders:  No orders of the defined types were placed in this encounter.  No orders of the defined types were placed in this encounter.     Procedures: No procedures performed   Clinical Data: No additional findings.   Subjective: Chief Complaint  Patient presents with  . Left Hand - Routine Post Op    Left small finger ostephyte removal DOS 11/01/2018  . Left Hip - Follow-up    Wants to discuss replacement.    [name] returns today for suture removal from his finger.  He is currently on hand therapy.  His range of motion is progressing slowly.  His left hip is bothering him significantly.  He would like to have this replaced.  He currently takes Plavix daily.  He is status post cardiac stent.   Review of Systems  Constitutional: Negative.   All other systems reviewed and are negative.    Objective: Vital Signs: Ht 5\' 11"  (1.803 m)   Wt 280 lb (127 kg)   BMI 39.05 kg/m   Physical Exam Vitals signs and nursing note  reviewed.  Constitutional:      Appearance: He is well-developed.  Pulmonary:     Effort: Pulmonary effort is normal.  Abdominal:     Palpations: Abdomen is soft.  Skin:    General: Skin is warm.  Neurological:     Mental Status: He is alert and oriented to person, place, and time.  Psychiatric:        Behavior: Behavior normal.        Thought Content: Thought content normal.        Judgment: Judgment normal.     Ortho Exam Left hip exam is unchanged. Left small finger exam shows a healed surgical incision.  Sutures are intact.  He is able to actively flex his PIP joint that he does have moderate limitation. Specialty Comments:  No specialty comments available.  Imaging: No results found.   PMFS History: Patient Active Problem List   Diagnosis Date Noted  . Primary osteoarthritis of right hip 11/21/2018  . Primary osteoarthritis of left hip 09/20/2018  . Nondisplaced fracture of proximal phalanx of left great toe, initial encounter for closed fracture 08/01/2018  . Displaced fracture of proximal phalanx of left little finger with routine healing 08/01/2018  . OSA on CPAP 05/26/2015  . Morbid  obesity due to excess calories ([place]) 05/26/2015  . CAD- LAD DES in setting of NSTEMI Sept 2014 04/06/2013  . Mixed hyperlipidemia 04/06/2013  . Old MI (myocardial infarction) 04/06/2013  . Encounter for long-term (current) use of other medications 04/06/2013  . Obesity, unspecified 10/08/2012   Past Medical History:  Diagnosis Date  . 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 Coronary artery disease    s/P PCI of the LAD with DES.  Nuclear stress test 04/2014 with no ischemia  . Hypercholesteremia   . Hyperlipidemia   . OSA on CPAP    followed by Dr. Andrew Au  . Personal history of urinary calculi   . 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     Family History  Problem Relation Age of Onset  . Heart disease Father   . Heart attack Father   . CAD  Father     Past Surgical History:  Procedure Laterality Date  . INGUINAL HERNIA REPAIR Right 03/08/2012   Procedure: HERNIA REPAIR INGUINAL ADULT;  Surgeon: Ralene Ok, MD;  Location: [place];  Service: General;  Laterality: Right;  . INSERTION OF MESH N/A 03/08/2012   Procedure: INSERTION OF MESH;  Surgeon: Ralene Ok, MD;  Location: [place];  Service: General;  Laterality: N/A;  . LEFT HEART CATHETERIZATION WITH CORONARY ANGIOGRAM Right 10/07/2012   Procedure: LEFT HEART CATHETERIZATION WITH CORONARY ANGIOGRAM;  Surgeon: Jettie Booze, MD;  Location: [facility] CATH LAB;  Service: Cardiovascular;  Laterality: Right;  . METACARPAL OSTEOTOMY Left 11/01/2018   Procedure: Left small finger osteophyte removal;  Surgeon: Leandrew Koyanagi, MD;  Location: [place];  Service: Orthopedics;  Laterality: Left;  . PERCUTANEOUS STENT INTERVENTION  10/07/2012   Procedure: PERCUTANEOUS STENT INTERVENTION;  Surgeon: Jettie Booze, MD;  Location[facility] CATH LAB;  Service: Cardiovascular;;   Social History   Occupational History  . Not on file  Tobacco Use  . Smoking status: Former Smoker    Packs/day: 1.50    Years: 7.00    Pack years: 10.50    Types: Cigarettes    Quit date: 10/01/2012    Years since quitting: 6.1  . Smokeless tobacco: Never Used  Substance and Sexual Activity  . Alcohol use: Not Currently  . Drug use: No  . Sexual activity: Not on file

## 2018-11-24 ENCOUNTER — Ambulatory Visit (INDEPENDENT_AMBULATORY_CARE_PROVIDER_SITE_OTHER): Payer: BC Managed Care – PPO | Admitting: Cardiology

## 2018-11-24 ENCOUNTER — Other Ambulatory Visit: Payer: Self-pay

## 2018-11-24 ENCOUNTER — Encounter: Payer: Self-pay | Admitting: Cardiology

## 2018-11-24 VITALS — BP 130/64 | HR 75 | Ht 71.0 in | Wt 283.0 lb

## 2018-11-24 DIAGNOSIS — E782 Mixed hyperlipidemia: Secondary | ICD-10-CM | POA: Diagnosis not present

## 2018-11-24 DIAGNOSIS — Z01818 Encounter for other preprocedural examination: Secondary | ICD-10-CM

## 2018-11-24 DIAGNOSIS — I2510 Atherosclerotic heart disease of native coronary artery without angina pectoris: Secondary | ICD-10-CM

## 2018-11-24 DIAGNOSIS — I252 Old myocardial infarction: Secondary | ICD-10-CM | POA: Diagnosis not present

## 2018-11-24 NOTE — Progress Notes (Signed)
.  my

## 2018-11-24 NOTE — Patient Instructions (Signed)
Medication Instructions:  Your physician recommends that you continue on your current medications as directed. Please refer to the Current Medication list given to you today.  *If you need a refill on your cardiac medications before your next appointment, please call your pharmacy*  Lab Work: None.  If you have labs (blood work) drawn today and your tests are completely normal, you will receive your results only by: Marland Kitchen MyChart Message (if you have MyChart) OR . A paper copy in the mail If you have any lab test that is abnormal or we need to change your treatment, we will call you to review the results.  Testing/Procedures: Your physician has requested that you have a lexiscan myoview. For further information please visit [email or web address] Please follow instruction sheet, as given.    Follow-Up: At [facility], you and your health needs are our priority.  As part of our continuing mission to provide you with exceptional heart care, we have created designated Provider Care Teams.  These Care Teams include your primary Cardiologist (physician) and Advanced Practice Providers (APPs -  Physician Assistants and Nurse Practitioners) who all work together to provide you with the care you need, when you need it.  Your next appointment:   6 months  The format for your next appointment:   In Person  Provider:   You may see Gypsy Balsam, MD or the following Advanced Practice Provider on your designated Care Team:    Gillian Shields, FNP   Other Instructions   Cardiac Nuclear Scan A cardiac nuclear scan is a test that measures blood flow to the heart when a person is resting and when he or she is exercising. The test looks for problems such as:  Not enough blood reaching a portion of the heart.  The heart muscle not working normally. You may need this test if:  You have heart disease.  You have had abnormal lab results.  You have had heart surgery or a balloon procedure to  open up blocked arteries (angioplasty).  You have chest pain.  You have shortness of breath. In this test, a radioactive dye (tracer) is injected into your bloodstream. After the tracer has traveled to your heart, an imaging device is used to measure how much of the tracer is absorbed by or distributed to various areas of your heart. This procedure is usually done at a hospital and takes 2-4 hours. Tell a health care provider about:  Any allergies you have.  All medicines you are taking, including vitamins, herbs, eye drops, creams, and over-the-counter medicines.  Any problems you or family members have had with anesthetic medicines.  Any blood disorders you have.  Any surgeries you have had.  Any medical conditions you have.  Whether you are pregnant or may be pregnant. What are the risks? Generally, this is a safe procedure. However, problems may occur, including:  Serious chest pain and heart attack. This is only a risk if the stress portion of the test is done.  Rapid heartbeat.  Sensation of warmth in your chest. This usually passes quickly.  Allergic reaction to the tracer. What happens before the procedure?  Ask your health care provider about changing or stopping your regular medicines. This is especially important if you are taking diabetes medicines or blood thinners.  Follow instructions from your health care provider about eating or drinking restrictions.  Remove your jewelry on the day of the procedure. What happens during the procedure?  An IV will be inserted into one  of your veins.  Your health care provider will inject a small amount of radioactive tracer through the IV.  You will wait for 20-40 minutes while the tracer travels through your bloodstream.  Your heart activity will be monitored with an electrocardiogram (ECG).  You will lie down on an exam table.  Images of your heart will be taken for about 15-20 minutes.  You may also have a stress  test. For this test, one of the following may be done: ? You will exercise on a treadmill or stationary bike. While you exercise, your heart's activity will be monitored with an ECG, and your blood pressure will be checked. ? You will be given medicines that will increase blood flow to parts of your heart. This is done if you are unable to exercise.  When blood flow to your heart has peaked, a tracer will again be injected through the IV.  After 20-40 minutes, you will get back on the exam table and have more images taken of your heart.  Depending on the type of tracer used, scans may need to be repeated 3-4 hours later.  Your IV line will be removed when the procedure is over. The procedure may vary among health care providers and hospitals. What happens after the procedure?  Unless your health care provider tells you otherwise, you may return to your normal schedule, including diet, activities, and medicines.  Unless your health care provider tells you otherwise, you may increase your fluid intake. This will help to flush the contrast dye from your body. Drink enough fluid to keep your urine pale yellow.  Ask your health care provider, or the department that is doing the test: ? When will my results be ready? ? How will I get my results? Summary  A cardiac nuclear scan measures the blood flow to the heart when a person is resting and when he or she is exercising.  Tell your health care provider if you are pregnant.  Before the procedure, ask your health care provider about changing or stopping your regular medicines. This is especially important if you are taking diabetes medicines or blood thinners.  After the procedure, unless your health care provider tells you otherwise, increase your fluid intake. This will help flush the contrast dye from your body.  After the procedure, unless your health care provider tells you otherwise, you may return to your normal schedule, including  diet, activities, and medicines. This information is not intended to replace advice given to you by your health care provider. Make sure you discuss any questions you have with your health care provider. Document Released: 01/30/2004 Document Revised: 06/20/2017 Document Reviewed: 06/20/2017 Elsevier Patient Education  2020 [employer].

## 2018-12-06 ENCOUNTER — Telehealth (HOSPITAL_COMMUNITY): Payer: Self-pay | Admitting: *Deleted

## 2018-12-06 NOTE — Telephone Encounter (Signed)
Patient given detailed instructions per Myocardial Perfusion Study Information Sheet for the test on 12/12/18. Patient notified to arrive 15 minutes early and that it is imperative to arrive on time for appointment to keep from having the test rescheduled.  If you need to cancel or reschedule your appointment, please call the office within 24 hours of your appointment. . Patient verbalized understanding. Adely Facer Jacqueline    

## 2018-12-12 ENCOUNTER — Ambulatory Visit (INDEPENDENT_AMBULATORY_CARE_PROVIDER_SITE_OTHER): Payer: BC Managed Care – PPO

## 2018-12-12 ENCOUNTER — Other Ambulatory Visit: Payer: Self-pay

## 2018-12-12 DIAGNOSIS — Z01818 Encounter for other preprocedural examination: Secondary | ICD-10-CM | POA: Diagnosis not present

## 2018-12-12 LAB — MYOCARDIAL PERFUSION IMAGING
LV dias vol: 72 mL (ref 62–150)
LV sys vol: 23 mL
Peak HR: 105 {beats}/min
Rest HR: 68 {beats}/min
SDS: 2
SRS: 1
SSS: 3
TID: 0.82

## 2018-12-12 MED ORDER — TECHNETIUM TC 99M TETROFOSMIN IV KIT
32.3000 | PACK | Freq: Once | INTRAVENOUS | Status: AC | PRN
  Administered 2018-12-12: 32.3 via INTRAVENOUS

## 2018-12-12 MED ORDER — REGADENOSON 0.4 MG/5ML IV SOLN
0.4000 mg | Freq: Once | INTRAVENOUS | Status: AC
  Administered 2018-12-12: 0.4 mg via INTRAVENOUS

## 2018-12-12 MED ORDER — TECHNETIUM TC 99M TETROFOSMIN IV KIT
10.0000 | PACK | Freq: Once | INTRAVENOUS | Status: AC | PRN
  Administered 2018-12-12: 10 via INTRAVENOUS

## 2018-12-12 NOTE — Progress Notes (Signed)
Cardiology Office Note:    Date:  12/12/2018   ID:  Wayne Jordan, DOB 01/08/1961, MRN [id number]  PCP:  Elias Elseeade, Petina Muraski, MD  Cardiologist:  Gypsy Balsamobert Chakita Mcgraw, MD    Referring MD: Elias Elseeade, Aleksi Brummet, MD   Chief Complaint  Patient presents with  . Pre-op Exam    Hip Replacement    History of Present Illness:    Wayne Jordan is a 58 y.o. male past medical history significant for coronary artery disease, status post non-STEMI in September 2014 which resulted in drug-eluting stent to LAD.  Also history of obstructive sleep apnea, dyslipidemia, hypertension comes today to my office he is scheduled to have hip replacement surgery he need to be evaluated before the surgery.  Denies having a chest pain tightness squeezing pressure burning chest just exertional shortness of breath.  However, ability to exercise somewhat limited because of hip problem.  Past Medical History:  Diagnosis Date  . 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 Coronary artery disease    s/P PCI of the LAD with DES.  Nuclear stress test 04/2014 with no ischemia  . Hypercholesteremia   . Hyperlipidemia   . OSA on CPAP    followed by Dr. Jessie Footoeimyer  . Personal history of urinary calculi   . 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     Past Surgical History:  Procedure Laterality Date  . INGUINAL HERNIA REPAIR Right 03/08/2012   Procedure: HERNIA REPAIR INGUINAL ADULT;  Surgeon: Axel FillerArmando Ramirez, MD;  [facility];  Service: General;  Laterality: Right;  . INSERTION OF MESH N/A 03/08/2012   Procedure: INSERTION OF MESH;  Surgeon: Axel FillerArmando Ramirez, MD;  Location: [facility] OR;  Service: General;  Laterality: N/A;  . LEFT HEART CATHETERIZATION WITH CORONARY ANGIOGRAM Right 10/07/2012   Procedure: LEFT HEART CATHETERIZATION WITH CORONARY ANGIOGRAM;  Surgeon: Corky CraftsJayadeep S Varanasi, MD;  [facility];  Service: Cardiovascular;  Laterality: Right;  . METACARPAL OSTEOTOMY Left 11/01/2018   Procedure: Left  small finger osteophyte removal;  Surgeon: Tarry KosXu, Naiping M, MD;  Location: [facility];  Service: Orthopedics;  Laterality: Left;  . PERCUTANEOUS STENT INTERVENTION  10/07/2012   Procedure: PERCUTANEOUS STENT INTERVENTION;  Surgeon: Corky CraftsJayadeep S Varanasi, MD;  [facility];  Service: Cardiovascular;;    Current Medications: Current Meds  Medication Sig  . ascorbic acid (VITAMIN C) 500 MG tablet Take 500 mg by mouth daily.  Marland Kitchen. aspirin 81 MG chewable tablet Chew 81 mg by mouth every evening.   Marland Kitchen. atorvastatin (LIPITOR) 80 MG tablet Take 1 tablet (80 mg total) by mouth daily after breakfast.  . clopidogrel (PLAVIX) 75 MG tablet TAKE 1 TABLET DAILY  . metoprolol tartrate (LOPRESSOR) 25 MG tablet TAKE 1 TABLET TWICE A DAY  . nitroGLYCERIN (NITROSTAT) 0.4 MG SL tablet Place 1 tablet (0.4 mg total) under the tongue every 5 (five) minutes x 3 doses as needed for chest pain.     Allergies:   Coconut fatty acids and Penicillins   Social History   Socioeconomic History  . Marital status: Married    Spouse name: Not on file  . Number of children: Not on file  . Years of education: Not on file  . Highest education level: Not on file  Occupational History  . Not on file  Social Needs  . Financial resource strain: Not on file  . Food insecurity    Worry: Not on file    Inability: Not  on file  . Transportation needs    Medical: Not on file    Non-medical: Not on file  Tobacco Use  . Smoking status: Former Smoker    Packs/day: 1.50    Years: 7.00    Pack years: 10.50    Types: Cigarettes    Quit date: 10/01/2012    Years since quitting: 6.2  . Smokeless tobacco: Never Used  Substance and Sexual Activity  . Alcohol use: Not Currently  . Drug use: No  . Sexual activity: Not on file  Lifestyle  . Physical activity    Days per week: Not on file    Minutes per session: Not on file  . Stress: Not on file  Relationships  . Social Herbalist on phone: Not on  file    Gets together: Not on file    Attends religious service: Not on file    Active member of club or organization: Not on file    Attends meetings of clubs or organizations: Not on file    Relationship status: Not on file  Other Topics Concern  . Not on file  Social History Narrative   Drinks 2 cheerwines daily.     Family History: The patient's family history includes CAD in his father; Heart attack in his father; Heart disease in his father. ROS:   Please see the history of present illness.    All 14 point review of systems negative except as described per history of present illness  EKGs/Labs/Other Studies Reviewed:      Recent Labs: 07/30/2018: ALT 44; BUN 13; Creatinine, Ser 1.05; Hemoglobin 15.3; Platelets 210; Potassium 4.0; Sodium 140  Recent Lipid Panel    Component Value Date/Time   CHOL 111 07/13/2018 1432   CHOL CANCELED 11/07/2014 0921   CHOL SEE BELOW 11/07/2014 0921   CHOL 128 04/26/2014 0757   TRIG 188 (H) 07/13/2018 1432   TRIG CANCELED 11/07/2014 0921   TRIG SEE BELOW 11/07/2014 0921   TRIG 140 04/26/2014 0757   HDL 37 (L) 07/13/2018 1432   HDL CANCELED 11/07/2014 0921   HDL SEE BELOW 11/07/2014 0921   HDL 39 (L) 04/26/2014 0757   CHOLHDL 3.0 07/13/2018 1432   CHOLHDL 2.6 04/18/2015 0930   VLDL 22 04/18/2015 0930   LDLCALC 36 07/13/2018 1432   LDLCALC CANCELED 11/07/2014 0921   LDLCALC SEE BELOW 11/07/2014 0921   LDLCALC 61 04/26/2014 0757    Physical Exam:    VS:  BP 130/64   Pulse 75   Ht 5\' 11"  (1.803 m)   Wt 283 lb (128.4 kg)   SpO2 98%   BMI 39.47 kg/m     Wt Readings from Last 3 Encounters:  12/12/18 283 lb (128.4 kg)  11/24/18 283 lb (128.4 kg)  11/21/18 280 lb (127 kg)     GEN:  Well nourished, well developed in no acute distress HEENT: Normal NECK: No JVD; No carotid bruits LYMPHATICS: No lymphadenopathy CARDIAC: RRR, no murmurs, no rubs, no gallops RESPIRATORY:  Clear to auscultation without rales, wheezing or rhonchi   ABDOMEN: Soft, non-tender, non-distended MUSCULOSKELETAL:  No edema; No deformity  SKIN: Warm and dry LOWER EXTREMITIES: no swelling NEUROLOGIC:  Alert and oriented x 3 PSYCHIATRIC:  Normal affect   ASSESSMENT:    1. Pre-op evaluation   2. Old MI (myocardial infarction)   3. Coronary artery disease involving native coronary artery of native heart without angina pectoris   4. Mixed hyperlipidemia    PLAN:  In order of problems listed above:  1. Preop evaluation.  I will schedule him to have a stress test to rule out any significant ischemia. 2. Old MI.  Noted again a stress test will be done 3. Coronary disease plan as outlined above 4. Dyslipidemia on statin which I will continue   Medication Adjustments/Labs and Tests Ordered: Current medicines are reviewed at length with the patient today.  Concerns regarding medicines are outlined above.  Orders Placed This Encounter  Procedures  . MYOCARDIAL PERFUSION IMAGING  . EKG 12-Lead   Medication changes: No orders of the defined types were placed in this encounter.   Signed, Georgeanna Lea, MD, [facility] 12/12/2018 11:35 AM    [facility]

## 2018-12-18 ENCOUNTER — Telehealth: Payer: Self-pay | Admitting: Radiology

## 2018-12-18 NOTE — Telephone Encounter (Signed)
Yes that's fine to extend

## 2018-12-18 NOTE — Telephone Encounter (Signed)
See message.

## 2018-12-18 NOTE — Telephone Encounter (Signed)
Patient came in to the office this morning needing work note extension. He states that he is still waiting for hip replacement surgery to be scheduled and work note only covered him until 12/16/2018.  Please email work note to [email or web address]

## 2018-12-19 NOTE — Telephone Encounter (Signed)
Note made.  

## 2019-01-01 ENCOUNTER — Other Ambulatory Visit: Payer: Self-pay

## 2019-01-05 NOTE — Progress Notes (Signed)
[id number] [place], [place] - [phone] AT [facility] [address] [place] [address] Phone: [phone] Fax: [phone]      Your procedure is scheduled on Monday, December 28th, 2020.   Report to [facility] Main Entrance "A" at 5:30 A.M., and check in at the Admitting office.   Call this number if you have problems the morning of surgery:  [phone]  Call [phone] if you have any questions prior to your surgery date Monday-Friday 8am-4pm    Remember:  Do not eat after midnight the night before your surgery  You may drink clear liquids until 4:15AM the morning of your surgery.    Clear liquids allowed are: Water, Non-Citrus Juices (without pulp), Carbonated Beverages, Clear Tea, Black Coffee Only, and Gatorade    Take these medicines the morning of surgery with A SIP OF WATER :  Atorvastatin (Lipitor) - if needed Metoprolol Tartrate (Lopressor) Nitroglycerin (Nitrostat) - if needed  7 days prior to surgery STOP taking any Aspirin (unless otherwise instructed by your surgeon), Aleve, Naproxen, Ibuprofen, Motrin, Advil, Goody's, BC's, all herbal medications, fish oil, and all vitamins.    The Morning of Surgery  Do not wear jewelry.  Do not wear lotions, powders, or perfumes/colognes, or deodorant  Do not shave 48 hours prior to surgery.  Men may shave face and neck.  Do not bring valuables to the hospital.  [facility] is not responsible for any belongings or valuables.  If you are a smoker, DO NOT Smoke 24 hours prior to surgery  If you wear a CPAP at night please bring your mask, tubing, and machine the morning of surgery   Remember that you must have someone to transport you home after your surgery, and remain with you for 24 hours if you are discharged the same day.   Please bring cases for contacts, glasses, hearing aids, dentures or bridgework because it cannot be worn into surgery.     Leave your suitcase in the car.  After surgery it may be brought to your room.  For patients admitted to the hospital, discharge time will be determined by your treatment team.  Patients discharged the day of surgery will not be allowed to drive home.    Special instructions:   [place]- Preparing For Surgery  Before surgery, you can play an important role. Because skin is not sterile, your skin needs to be as free of germs as possible. You can reduce the number of germs on your skin by washing with CHG (chlorahexidine gluconate) Soap before surgery.  CHG is an antiseptic cleaner which kills germs and bonds with the skin to continue killing germs even after washing.    Oral Hygiene is also important to reduce your risk of infection.  Remember - BRUSH YOUR TEETH THE MORNING OF SURGERY WITH YOUR REGULAR TOOTHPASTE  Please do not use if you have an allergy to CHG or antibacterial soaps. If your skin becomes reddened/irritated stop using the CHG.  Do not shave (including legs and underarms) for at least 48 hours prior to first CHG shower. It is OK to shave your face.  Please follow these instructions carefully.   1. Shower the NIGHT BEFORE SURGERY and the MORNING OF SURGERY with CHG Soap.   2. If you chose to wash your hair, wash your hair first as usual with your normal shampoo.  3. After you shampoo, rinse your hair and body thoroughly to remove  the shampoo.  4. Use CHG as you would any other liquid soap. You can apply CHG directly to the skin and wash gently with a scrungie or a clean washcloth.   5. Apply the CHG Soap to your body ONLY FROM THE NECK DOWN.  Do not use on open wounds or open sores. Avoid contact with your eyes, ears, mouth and genitals (private parts). Wash Face and genitals (private parts)  with your normal soap.   6. Wash thoroughly, paying special attention to the area where your surgery will be performed.  7. Thoroughly rinse your body with warm water from  the neck down.  8. DO NOT shower/wash with your normal soap after using and rinsing off the CHG Soap.  9. Pat yourself dry with a CLEAN TOWEL.  10. Wear CLEAN PAJAMAS to bed the night before surgery, wear comfortable clothes the morning of surgery  11. Place CLEAN SHEETS on your bed the night of your first shower and DO NOT SLEEP WITH PETS.    Day of Surgery:  Please shower the morning of surgery with the CHG soap Do not apply any deodorants/lotions. Please wear clean clothes to the hospital/surgery center.   Remember to brush your teeth WITH YOUR REGULAR TOOTHPASTE.   Please read over the following fact sheets that you were given.

## 2019-01-08 ENCOUNTER — Encounter (HOSPITAL_COMMUNITY)
Admission: RE | Admit: 2019-01-08 | Discharge: 2019-01-08 | Disposition: A | Discharge status: Home / Self Care | Payer: BC Managed Care – PPO | Source: Ambulatory Visit | Attending: Orthopaedic Surgery | Admitting: Orthopaedic Surgery

## 2019-01-08 ENCOUNTER — Ambulatory Visit (HOSPITAL_COMMUNITY)
Admission: RE | Admit: 2019-01-08 | Discharge: 2019-01-08 | Disposition: A | Discharge status: Home / Self Care | Payer: BC Managed Care – PPO | Source: Ambulatory Visit | Attending: Physician Assistant | Admitting: Physician Assistant

## 2019-01-08 ENCOUNTER — Encounter (HOSPITAL_COMMUNITY): Payer: Self-pay

## 2019-01-08 ENCOUNTER — Other Ambulatory Visit: Payer: Self-pay

## 2019-01-08 DIAGNOSIS — M1612 Unilateral primary osteoarthritis, left hip: Secondary | ICD-10-CM

## 2019-01-08 LAB — CBC WITH DIFFERENTIAL/PLATELET
Abs Immature Granulocytes: 0.01 10*3/uL (ref 0.00–0.07)
Basophils Absolute: 0 10*3/uL (ref 0.0–0.1)
Basophils Relative: 1 %
Eosinophils Absolute: 0.2 10*3/uL (ref 0.0–0.5)
Eosinophils Relative: 3 %
HCT: 48.4 % (ref 39.0–52.0)
Hemoglobin: 15.8 g/dL (ref 13.0–17.0)
Immature Granulocytes: 0 %
Lymphocytes Relative: 38 %
Lymphs Abs: 2.4 10*3/uL (ref 0.7–4.0)
MCH: 30.9 pg (ref 26.0–34.0)
MCHC: 32.6 g/dL (ref 30.0–36.0)
MCV: 94.5 fL (ref 80.0–100.0)
Monocytes Absolute: 0.5 10*3/uL (ref 0.1–1.0)
Monocytes Relative: 8 %
Neutro Abs: 3.3 10*3/uL (ref 1.7–7.7)
Neutrophils Relative %: 50 %
Platelets: 206 10*3/uL (ref 150–400)
RBC: 5.12 MIL/uL (ref 4.22–5.81)
RDW: 12.8 % (ref 11.5–15.5)
WBC: 6.5 10*3/uL (ref 4.0–10.5)
nRBC: 0 % (ref 0.0–0.2)

## 2019-01-08 LAB — APTT: aPTT: 30 seconds (ref 24–36)

## 2019-01-08 LAB — COMPREHENSIVE METABOLIC PANEL
ALT: 48 U/L — ABNORMAL HIGH (ref 0–44)
AST: 36 U/L (ref 15–41)
Albumin: 4 g/dL (ref 3.5–5.0)
Alkaline Phosphatase: 116 U/L (ref 38–126)
Anion gap: 7 (ref 5–15)
BUN: 11 mg/dL (ref 6–20)
CO2: 26 mmol/L (ref 22–32)
Calcium: 9.6 mg/dL (ref 8.9–10.3)
Chloride: 108 mmol/L (ref 98–111)
Creatinine, Ser: 1.16 mg/dL (ref 0.61–1.24)
GFR calc Af Amer: >60 mL/min (ref >60)
GFR calc non Af Amer: >60 mL/min (ref >60)
Glucose, Bld: 100 mg/dL — ABNORMAL HIGH (ref 70–99)
Potassium: 4.4 mmol/L (ref 3.5–5.1)
Sodium: 141 mmol/L (ref 135–145)
Total Bilirubin: 1 mg/dL (ref 0.3–1.2)
Total Protein: 7.1 g/dL (ref 6.5–8.1)

## 2019-01-08 LAB — SURGICAL PCR SCREEN
MRSA, PCR: NEGATIVE
Staphylococcus aureus: NEGATIVE

## 2019-01-08 LAB — PROTIME-INR
INR: 1 (ref 0.8–1.2)
Prothrombin Time: 13.4 seconds (ref 11.4–15.2)

## 2019-01-08 LAB — TYPE AND SCREEN
ABO/RH(D): O POS
Antibody Screen: NEGATIVE

## 2019-01-08 LAB — ABO/RH: ABO/RH(D): O POS

## 2019-01-08 NOTE — Progress Notes (Signed)
PCP - Maury Dus Cardiologist - [facility]   Chest x-ray - 01-08-19 EKG - 01-08-19 Stress Test - 12-12-18 ECHO - 07-25-18 Cardiac Cath -  2014  SA - yes, wears CPAP  Blood Thinner Instructions: Follow surgeon's instructions on when to stop Plavix & ASA.  Pt stated understanding.  While at PAT appointment, pt called Dr. Erlinda Hong for instructions.   ERAS Protcol - yes, Ensure ordered and given   COVID TEST- Thursday 01-11-19   Anesthesia review: yes, heart history  Patient denies shortness of breath, fever, cough and chest pain at PAT appointment   All instructions explained to the patient, with a verbal understanding of the material. Patient agrees to go over the instructions while at home for a better understanding. Patient also instructed to self quarantine after being tested for COVID-19. The opportunity to ask questions was provided.

## 2019-01-09 ENCOUNTER — Telehealth: Payer: Self-pay | Admitting: Orthopaedic Surgery

## 2019-01-09 NOTE — Progress Notes (Signed)
Anesthesia Chart Review:  Follows with cardiology for hx of CAD s/p non-STEMI in September 2014 which resulted in drug-eluting stent to LAD. Pt was seen by Dr. Agustin Cree on 11/24/18 for preop clearance. Stress test was ordered and done 12/12/18 showing EF 54%, no ischemia, low risk, and pt was cleared.   OSA on CPAP with excellent compliance per neurology televisit 07/06/18.  LD of Plavix 01/08/19 at 7am.  Preop labs reviewed, unremarkable.  EKG 01/08/19: NSR. Rate 63.  Nuclear stress 12/12/18:  The left ventricular ejection fraction is hyperdynamic (>65%).  Nuclear stress EF: 68%.  There was no ST segment deviation noted during stress.  No T wave inversion was noted during stress.  The study is normal.  This is a low risk study.  TTE 07/25/18:  1. The left ventricle has normal systolic function with an ejection fraction of 60-65%. The cavity size was normal. There is mildly increased left ventricular wall thickness. Left ventricular diastolic Doppler parameters are consistent with impaired  relaxation.  2. The right ventricle has normal systolic function. The cavity was normal. There is no increase in right ventricular wall thickness.   Wayne Jordan [facility] Short Stay Center/Anesthesiology Phone [phone] 01/09/2019 10:44 AM

## 2019-01-09 NOTE — Progress Notes (Signed)
Fyi on the chest xray

## 2019-01-09 NOTE — Anesthesia Preprocedure Evaluation (Addendum)
Anesthesia Evaluation  Patient identified by MRN, date of birth, ID band Patient awake    Reviewed: Allergy & Precautions, NPO status , Patient's Chart, lab work & pertinent test results  Airway Mallampati: II  TM Distance: >3 FB Neck ROM: Full    Dental no notable dental hx. (+) Teeth Intact, Dental Advisory Given   Pulmonary sleep apnea and Continuous Positive Airway Pressure Ventilation , former smoker,    Pulmonary exam normal breath sounds clear to auscultation       Cardiovascular + CAD and + Past MI  negative cardio ROS Normal cardiovascular exam Rhythm:Regular Rate:Normal     Neuro/Psych negative neurological ROS  negative psych ROS   GI/Hepatic negative GI ROS, Neg liver ROS,   Endo/Other  negative endocrine ROS  Renal/GU negative Renal ROS     Musculoskeletal   Abdominal (+) + obese,   Peds  Hematology negative hematology ROS (+)   Anesthesia Other Findings plavix stopped 12/21  Reproductive/Obstetrics                           Anesthesia Physical Anesthesia Plan  ASA: III  Anesthesia Plan: Spinal   Post-op Pain Management:    Induction:   PONV Risk Score and Plan: 2 and Treatment may vary due to age or medical condition, Ondansetron and Midazolam  Airway Management Planned: Nasal Cannula and Natural Airway  Additional Equipment:   Intra-op Plan:   Post-operative Plan:   Informed Consent: I have reviewed the patients History and Physical, chart, labs and discussed the procedure including the risks, benefits and alternatives for the proposed anesthesia with the patient or authorized representative who has indicated his/her understanding and acceptance.     Dental advisory given  Plan Discussed with: CRNA  Anesthesia Plan Comments: ( Follows with cardiology for hx of CAD s/p non-STEMI in September 2014 which resulted in drug-eluting stent to LAD. Pt was seen by  Dr. Agustin Cree on 11/24/18 for preop clearance. Stress test was ordered and done 12/12/18 showing EF 54%, no ischemia, low risk, and pt was cleared.   OSA on CPAP with excellent compliance per neurology televisit 07/06/18.  LD of Plavix 01/08/19 at 7am.  Preop labs reviewed, unremarkable.  EKG 01/08/19: NSR. Rate 63.  Nuclear stress 12/12/18: The left ventricular ejection fraction is hyperdynamic (>65%). Nuclear stress EF: 68%. There was no ST segment deviation noted during stress. No T wave inversion was noted during stress. The study is normal. This is a low risk study.  TTE 07/25/18:  1. The left ventricle has normal systolic function with an ejection fraction of 60-65%. The cavity size was normal. There is mildly increased left ventricular wall thickness. Left ventricular diastolic Doppler parameters are consistent with impaired  relaxation.  2. The right ventricle has normal systolic function. The cavity was normal. There is no increase in right ventricular wall thickness. )      Anesthesia Quick Evaluation

## 2019-01-09 NOTE — Telephone Encounter (Signed)
Patient called, needs 11/21/18 ov note faxed to [place], Attn: April. I faxed.

## 2019-01-11 ENCOUNTER — Other Ambulatory Visit (HOSPITAL_COMMUNITY)
Admission: RE | Admit: 2019-01-11 | Discharge: 2019-01-11 | Disposition: A | Discharge status: Home / Self Care | Payer: BC Managed Care – PPO | Source: Ambulatory Visit | Attending: Orthopaedic Surgery | Admitting: Orthopaedic Surgery

## 2019-01-11 DIAGNOSIS — Z01812 Encounter for preprocedural laboratory examination: Secondary | ICD-10-CM | POA: Diagnosis not present

## 2019-01-11 DIAGNOSIS — Z20828 Contact with and (suspected) exposure to other viral communicable diseases: Secondary | ICD-10-CM | POA: Diagnosis not present

## 2019-01-11 MED ORDER — TRANEXAMIC ACID-NACL 1000-0.7 MG/100ML-% IV SOLN
1000.0000 mg | INTRAVENOUS | Status: AC
  Administered 2019-01-15: 1000 mg via INTRAVENOUS
  Filled 2019-01-11: qty 100

## 2019-01-11 MED ORDER — CLINDAMYCIN PHOSPHATE 900 MG/50ML IV SOLN
900.0000 mg | INTRAVENOUS | Status: AC
  Administered 2019-01-15: 900 mg via INTRAVENOUS
  Filled 2019-01-11: qty 50

## 2019-01-11 MED ORDER — TRANEXAMIC ACID 1000 MG/10ML IV SOLN
2000.0000 mg | INTRAVENOUS | Status: AC
  Administered 2019-01-15: 08:00:00 2000 mg via TOPICAL
  Filled 2019-01-11: qty 20

## 2019-01-12 LAB — NOVEL CORONAVIRUS, NAA (HOSP ORDER, SEND-OUT TO REF LAB; TAT 18-24 HRS): SARS-CoV-2, NAA: NOT DETECTED

## 2019-01-14 ENCOUNTER — Telehealth: Payer: Self-pay | Admitting: *Deleted

## 2019-01-14 NOTE — Telephone Encounter (Signed)
RNCM from Dr. Phoebe Sharps office contacted patient due to being an anticipated same day discharge after his Left THA on 01/15/19. Discussed that home health PT will be ordered and referral has been made to [facility]. Choice provided. Someone from the [facility] agency will be in contact with him after his discharge tomorrow anticipating beginning HHPT as soon as possible after d/c home. Also patient stated he has assistance at home. He will be using a FWW that he will be borrowing from his mother-in-law, needed for home use after surgery. No other DME needed at this time. Discussed possibility of needing a 3in1 or elevated toilet seat. Patient states he would speak with his wife and decide and give office a call if he is in need of this after he goes home from hospital. Reminded to call office for any questions or concerns.

## 2019-01-14 NOTE — Discharge Instructions (Signed)

## 2019-01-15 ENCOUNTER — Encounter (HOSPITAL_COMMUNITY): Payer: Self-pay | Admitting: Orthopaedic Surgery

## 2019-01-15 ENCOUNTER — Encounter (HOSPITAL_COMMUNITY)
Admission: RE | Disposition: A | Discharge status: Home / Self Care | Payer: Self-pay | Source: Home / Self Care | Attending: Orthopaedic Surgery

## 2019-01-15 ENCOUNTER — Ambulatory Visit (HOSPITAL_COMMUNITY): Payer: BC Managed Care – PPO | Admitting: Anesthesiology

## 2019-01-15 ENCOUNTER — Inpatient Hospital Stay (HOSPITAL_COMMUNITY)
Admission: RE | Admit: 2019-01-15 | Discharge: 2019-01-15 | DRG: 470 | Disposition: A | Discharge status: Home / Self Care | Payer: BC Managed Care – PPO | Attending: Orthopaedic Surgery | Admitting: Orthopaedic Surgery

## 2019-01-15 ENCOUNTER — Observation Stay (HOSPITAL_COMMUNITY): Payer: BC Managed Care – PPO

## 2019-01-15 ENCOUNTER — Other Ambulatory Visit: Payer: Self-pay

## 2019-01-15 ENCOUNTER — Ambulatory Visit (HOSPITAL_COMMUNITY): Payer: BC Managed Care – PPO

## 2019-01-15 ENCOUNTER — Ambulatory Visit (HOSPITAL_COMMUNITY): Payer: BC Managed Care – PPO | Admitting: Physician Assistant

## 2019-01-15 DIAGNOSIS — Z79899 Other long term (current) drug therapy: Secondary | ICD-10-CM | POA: Diagnosis not present

## 2019-01-15 DIAGNOSIS — Z20828 Contact with and (suspected) exposure to other viral communicable diseases: Secondary | ICD-10-CM | POA: Diagnosis present

## 2019-01-15 DIAGNOSIS — E785 Hyperlipidemia, unspecified: Secondary | ICD-10-CM | POA: Diagnosis present

## 2019-01-15 DIAGNOSIS — I2510 Atherosclerotic heart disease of native coronary artery without angina pectoris: Secondary | ICD-10-CM | POA: Diagnosis present

## 2019-01-15 DIAGNOSIS — E669 Obesity, unspecified: Secondary | ICD-10-CM | POA: Diagnosis present

## 2019-01-15 DIAGNOSIS — Z87891 Personal history of nicotine dependence: Secondary | ICD-10-CM | POA: Diagnosis not present

## 2019-01-15 DIAGNOSIS — Z7982 Long term (current) use of aspirin: Secondary | ICD-10-CM

## 2019-01-15 DIAGNOSIS — Z955 Presence of coronary angioplasty implant and graft: Secondary | ICD-10-CM | POA: Diagnosis not present

## 2019-01-15 DIAGNOSIS — Z7902 Long term (current) use of antithrombotics/antiplatelets: Secondary | ICD-10-CM | POA: Diagnosis not present

## 2019-01-15 DIAGNOSIS — M1612 Unilateral primary osteoarthritis, left hip: Secondary | ICD-10-CM | POA: Diagnosis present

## 2019-01-15 DIAGNOSIS — I252 Old myocardial infarction: Secondary | ICD-10-CM | POA: Diagnosis not present

## 2019-01-15 DIAGNOSIS — Z96642 Presence of left artificial hip joint: Secondary | ICD-10-CM

## 2019-01-15 DIAGNOSIS — Z8249 Family history of ischemic heart disease and other diseases of the circulatory system: Secondary | ICD-10-CM | POA: Diagnosis not present

## 2019-01-15 DIAGNOSIS — Z87442 Personal history of urinary calculi: Secondary | ICD-10-CM

## 2019-01-15 DIAGNOSIS — Z96649 Presence of unspecified artificial hip joint: Secondary | ICD-10-CM

## 2019-01-15 DIAGNOSIS — G4733 Obstructive sleep apnea (adult) (pediatric): Secondary | ICD-10-CM | POA: Diagnosis present

## 2019-01-15 DIAGNOSIS — Z419 Encounter for procedure for purposes other than remedying health state, unspecified: Secondary | ICD-10-CM

## 2019-01-15 DIAGNOSIS — Z6838 Body mass index (BMI) 38.0-38.9, adult: Secondary | ICD-10-CM

## 2019-01-15 HISTORY — PX: TOTAL HIP ARTHROPLASTY: SHX124

## 2019-01-15 HISTORY — DX: Presence of left artificial hip joint: Z96.642

## 2019-01-15 SURGERY — ARTHROPLASTY, HIP, TOTAL, ANTERIOR APPROACH
Anesthesia: Spinal | Site: Hip | Laterality: Left

## 2019-01-15 MED ORDER — SODIUM CHLORIDE 0.9 % IV SOLN: INTRAVENOUS | Status: DC

## 2019-01-15 MED ORDER — ATORVASTATIN CALCIUM 80 MG PO TABS: 80.0000 mg | ORAL_TABLET | Freq: Every day | ORAL | Status: DC

## 2019-01-15 MED ORDER — METOCLOPRAMIDE HCL 5 MG PO TABS: 5.0000 mg | ORAL_TABLET | Freq: Three times a day (TID) | ORAL | Status: DC | PRN

## 2019-01-15 MED ORDER — LIDOCAINE 2% (20 MG/ML) 5 ML SYRINGE
INTRAMUSCULAR | Status: DC | PRN
  Administered 2019-01-15: 40 mg via INTRAVENOUS

## 2019-01-15 MED ORDER — PROPOFOL 10 MG/ML IV BOLUS
INTRAVENOUS | Status: DC | PRN
  Administered 2019-01-15: 20 mg via INTRAVENOUS

## 2019-01-15 MED ORDER — ACETAMINOPHEN 500 MG PO TABS
1000.0000 mg | ORAL_TABLET | Freq: Four times a day (QID) | ORAL | Status: DC
  Administered 2019-01-15: 1000 mg via ORAL
  Filled 2019-01-15: qty 2

## 2019-01-15 MED ORDER — METHOCARBAMOL 750 MG PO TABS: 750.0000 mg | ORAL_TABLET | Freq: Two times a day (BID) | ORAL | 3 refills | Status: DC | PRN

## 2019-01-15 MED ORDER — OXYCODONE HCL 5 MG PO TABS: 5.0000 mg | ORAL_TABLET | Freq: Three times a day (TID) | ORAL | 0 refills | Status: DC | PRN

## 2019-01-15 MED ORDER — ASCORBIC ACID 500 MG PO TABS
500.0000 mg | ORAL_TABLET | Freq: Every day | ORAL | Status: DC
  Administered 2019-01-15: 1000 mg via ORAL
  Filled 2019-01-15: qty 2

## 2019-01-15 MED ORDER — VANCOMYCIN HCL 1 G IV SOLR
INTRAVENOUS | Status: DC | PRN
  Administered 2019-01-15: 1000 mg

## 2019-01-15 MED ORDER — OXYCODONE HCL ER 10 MG PO T12A
10.0000 mg | EXTENDED_RELEASE_TABLET | Freq: Two times a day (BID) | ORAL | Status: DC
  Administered 2019-01-15: 10 mg via ORAL
  Filled 2019-01-15: qty 1

## 2019-01-15 MED ORDER — MENTHOL 3 MG MT LOZG: 1.0000 | LOZENGE | OROMUCOSAL | Status: DC | PRN

## 2019-01-15 MED ORDER — OXYCODONE HCL ER 10 MG PO T12A: 10.0000 mg | EXTENDED_RELEASE_TABLET | Freq: Two times a day (BID) | ORAL | 0 refills | Status: AC

## 2019-01-15 MED ORDER — ONDANSETRON HCL 4 MG/2ML IJ SOLN: 4.0000 mg | Freq: Once | INTRAMUSCULAR | Status: DC | PRN

## 2019-01-15 MED ORDER — METHOCARBAMOL 750 MG PO TABS
750.0000 mg | ORAL_TABLET | Freq: Four times a day (QID) | ORAL | Status: DC | PRN
  Administered 2019-01-15: 750 mg via ORAL
  Filled 2019-01-15: qty 1

## 2019-01-15 MED ORDER — METOPROLOL TARTRATE 25 MG PO TABS: 25.0000 mg | ORAL_TABLET | Freq: Two times a day (BID) | ORAL | Status: DC

## 2019-01-15 MED ORDER — OXYCODONE HCL 5 MG/5ML PO SOLN: 5.0000 mg | Freq: Once | ORAL | Status: DC | PRN

## 2019-01-15 MED ORDER — FENTANYL CITRATE (PF) 100 MCG/2ML IJ SOLN: 25.0000 ug | INTRAMUSCULAR | Status: DC | PRN

## 2019-01-15 MED ORDER — BUPIVACAINE LIPOSOME 1.3 % IJ SUSP
20.0000 mL | INTRAMUSCULAR | Status: AC
  Administered 2019-01-15: 20 mL
  Filled 2019-01-15: qty 20

## 2019-01-15 MED ORDER — CLINDAMYCIN PHOSPHATE 900 MG/50ML IV SOLN
900.0000 mg | Freq: Three times a day (TID) | INTRAVENOUS | Status: DC
  Administered 2019-01-15: 900 mg via INTRAVENOUS
  Filled 2019-01-15 (×3): qty 50

## 2019-01-15 MED ORDER — METHOCARBAMOL 1000 MG/10ML IJ SOLN: 500.0000 mg | Freq: Four times a day (QID) | INTRAVENOUS | Status: DC | PRN

## 2019-01-15 MED ORDER — BUPIVACAINE HCL 0.25 % IJ SOLN
INTRAMUSCULAR | Status: DC | PRN
  Administered 2019-01-15: 20 mL

## 2019-01-15 MED ORDER — ALUM & MAG HYDROXIDE-SIMETH 200-200-20 MG/5ML PO SUSP: 30.0000 mL | ORAL | Status: DC | PRN

## 2019-01-15 MED ORDER — VANCOMYCIN HCL 1000 MG IV SOLR
INTRAVENOUS | Status: AC
  Filled 2019-01-15: qty 1000

## 2019-01-15 MED ORDER — POLYETHYLENE GLYCOL 3350 17 G PO PACK: 17.0000 g | PACK | Freq: Every day | ORAL | Status: DC | PRN

## 2019-01-15 MED ORDER — OXYCODONE HCL 5 MG PO TABS: 10.0000 mg | ORAL_TABLET | ORAL | Status: DC | PRN

## 2019-01-15 MED ORDER — MIDAZOLAM HCL 2 MG/2ML IJ SOLN
INTRAMUSCULAR | Status: AC
  Filled 2019-01-15: qty 2

## 2019-01-15 MED ORDER — PHENOL 1.4 % MT LIQD: 1.0000 | OROMUCOSAL | Status: DC | PRN

## 2019-01-15 MED ORDER — PHENYLEPHRINE HCL-NACL 10-0.9 MG/250ML-% IV SOLN
INTRAVENOUS | Status: DC | PRN
  Administered 2019-01-15: 30 ug/min via INTRAVENOUS

## 2019-01-15 MED ORDER — PROPOFOL 10 MG/ML IV BOLUS
INTRAVENOUS | Status: AC
  Filled 2019-01-15: qty 20

## 2019-01-15 MED ORDER — ACETAMINOPHEN 10 MG/ML IV SOLN: 1000.0000 mg | Freq: Once | INTRAVENOUS | Status: DC | PRN

## 2019-01-15 MED ORDER — LACTATED RINGERS IV SOLN: INTRAVENOUS | Status: DC

## 2019-01-15 MED ORDER — SODIUM CHLORIDE 0.9 % IR SOLN
Status: DC | PRN
  Administered 2019-01-15: 3000 mL

## 2019-01-15 MED ORDER — ASPIRIN 81 MG PO CHEW: 81.0000 mg | CHEWABLE_TABLET | Freq: Two times a day (BID) | ORAL | Status: DC

## 2019-01-15 MED ORDER — FENTANYL CITRATE (PF) 100 MCG/2ML IJ SOLN
INTRAMUSCULAR | Status: DC | PRN
  Administered 2019-01-15: 25 ug via INTRAVENOUS

## 2019-01-15 MED ORDER — SORBITOL 70 % SOLN
30.0000 mL | Freq: Every day | Status: DC | PRN
  Filled 2019-01-15: qty 30

## 2019-01-15 MED ORDER — LIDOCAINE 2% (20 MG/ML) 5 ML SYRINGE
INTRAMUSCULAR | Status: AC
  Filled 2019-01-15: qty 5

## 2019-01-15 MED ORDER — LACTATED RINGERS IV BOLUS
250.0000 mL | Freq: Once | INTRAVENOUS | Status: AC
  Administered 2019-01-15: 250 mL via INTRAVENOUS

## 2019-01-15 MED ORDER — MIDAZOLAM HCL 5 MG/5ML IJ SOLN
INTRAMUSCULAR | Status: DC | PRN
  Administered 2019-01-15 (×2): 1 mg via INTRAVENOUS

## 2019-01-15 MED ORDER — CLOPIDOGREL BISULFATE 75 MG PO TABS
75.0000 mg | ORAL_TABLET | Freq: Every day | ORAL | Status: DC
  Administered 2019-01-15: 75 mg via ORAL
  Filled 2019-01-15: qty 1

## 2019-01-15 MED ORDER — OXYCODONE HCL 5 MG PO TABS
5.0000 mg | ORAL_TABLET | ORAL | Status: DC | PRN
  Administered 2019-01-15: 12:00:00 5 mg via ORAL
  Filled 2019-01-15 (×2): qty 1

## 2019-01-15 MED ORDER — LACTATED RINGERS IV BOLUS: 500.0000 mL | Freq: Once | INTRAVENOUS | Status: DC

## 2019-01-15 MED ORDER — 0.9 % SODIUM CHLORIDE (POUR BTL) OPTIME
TOPICAL | Status: DC | PRN
  Administered 2019-01-15: 1000 mL

## 2019-01-15 MED ORDER — PROPOFOL 500 MG/50ML IV EMUL
INTRAVENOUS | Status: DC | PRN
  Administered 2019-01-15: 75 ug/kg/min via INTRAVENOUS

## 2019-01-15 MED ORDER — CHLORHEXIDINE GLUCONATE 4 % EX LIQD: 60.0000 mL | Freq: Once | CUTANEOUS | Status: DC

## 2019-01-15 MED ORDER — CEFAZOLIN SODIUM-DEXTROSE 2-4 GM/100ML-% IV SOLN: 2.0000 g | Freq: Four times a day (QID) | INTRAVENOUS | Status: DC

## 2019-01-15 MED ORDER — GABAPENTIN 300 MG PO CAPS
300.0000 mg | ORAL_CAPSULE | Freq: Three times a day (TID) | ORAL | Status: DC
  Administered 2019-01-15: 300 mg via ORAL
  Filled 2019-01-15 (×2): qty 1

## 2019-01-15 MED ORDER — TRANEXAMIC ACID-NACL 1000-0.7 MG/100ML-% IV SOLN
1000.0000 mg | Freq: Once | INTRAVENOUS | Status: DC
  Filled 2019-01-15: qty 100

## 2019-01-15 MED ORDER — ONDANSETRON HCL 4 MG PO TABS: 4.0000 mg | ORAL_TABLET | Freq: Four times a day (QID) | ORAL | Status: DC | PRN

## 2019-01-15 MED ORDER — BUPIVACAINE HCL (PF) 0.25 % IJ SOLN
INTRAMUSCULAR | Status: AC
  Filled 2019-01-15: qty 30

## 2019-01-15 MED ORDER — KETOROLAC TROMETHAMINE 15 MG/ML IJ SOLN
15.0000 mg | Freq: Four times a day (QID) | INTRAMUSCULAR | Status: DC
  Administered 2019-01-15: 15 mg via INTRAVENOUS
  Filled 2019-01-15: qty 1

## 2019-01-15 MED ORDER — METOCLOPRAMIDE HCL 5 MG/ML IJ SOLN: 5.0000 mg | Freq: Three times a day (TID) | INTRAMUSCULAR | Status: DC | PRN

## 2019-01-15 MED ORDER — POVIDONE-IODINE 10 % EX SWAB: 2.0000 "application " | Freq: Once | CUTANEOUS | Status: DC

## 2019-01-15 MED ORDER — ONDANSETRON HCL 4 MG PO TABS: 4.0000 mg | ORAL_TABLET | Freq: Three times a day (TID) | ORAL | 0 refills | Status: DC | PRN

## 2019-01-15 MED ORDER — SODIUM CHLORIDE (PF) 0.9 % IJ SOLN
INTRAMUSCULAR | Status: DC | PRN
  Administered 2019-01-15: 20 mL

## 2019-01-15 MED ORDER — ONDANSETRON HCL 4 MG/2ML IJ SOLN
INTRAMUSCULAR | Status: DC | PRN
  Administered 2019-01-15: 4 mg via INTRAVENOUS

## 2019-01-15 MED ORDER — DOCUSATE SODIUM 100 MG PO CAPS
100.0000 mg | ORAL_CAPSULE | Freq: Two times a day (BID) | ORAL | Status: DC
  Administered 2019-01-15: 100 mg via ORAL
  Filled 2019-01-15: qty 1

## 2019-01-15 MED ORDER — DEXAMETHASONE SODIUM PHOSPHATE 10 MG/ML IJ SOLN
INTRAMUSCULAR | Status: AC
  Filled 2019-01-15: qty 1

## 2019-01-15 MED ORDER — FENTANYL CITRATE (PF) 250 MCG/5ML IJ SOLN
INTRAMUSCULAR | Status: AC
  Filled 2019-01-15: qty 5

## 2019-01-15 MED ORDER — ONDANSETRON HCL 4 MG/2ML IJ SOLN: 4.0000 mg | Freq: Four times a day (QID) | INTRAMUSCULAR | Status: DC | PRN

## 2019-01-15 MED ORDER — DEXAMETHASONE SODIUM PHOSPHATE 10 MG/ML IJ SOLN: 10.0000 mg | Freq: Once | INTRAMUSCULAR | Status: DC

## 2019-01-15 MED ORDER — OXYCODONE HCL 5 MG PO TABS: 5.0000 mg | ORAL_TABLET | Freq: Once | ORAL | Status: DC | PRN

## 2019-01-15 MED ORDER — DIPHENHYDRAMINE HCL 12.5 MG/5ML PO ELIX
25.0000 mg | ORAL_SOLUTION | ORAL | Status: DC | PRN
  Filled 2019-01-15: qty 10

## 2019-01-15 MED ORDER — ONDANSETRON HCL 4 MG/2ML IJ SOLN
INTRAMUSCULAR | Status: AC
  Filled 2019-01-15: qty 2

## 2019-01-15 MED ORDER — BUPIVACAINE IN DEXTROSE 0.75-8.25 % IT SOLN
INTRATHECAL | Status: DC | PRN
  Administered 2019-01-15: 15 mg via INTRATHECAL

## 2019-01-15 MED ORDER — ASPIRIN 81 MG PO CHEW: 81.0000 mg | CHEWABLE_TABLET | Freq: Every evening | ORAL | Status: DC

## 2019-01-15 MED ORDER — ACETAMINOPHEN 325 MG PO TABS: 325.0000 mg | ORAL_TABLET | Freq: Four times a day (QID) | ORAL | Status: DC | PRN

## 2019-01-15 MED ORDER — HYDROMORPHONE HCL 1 MG/ML IJ SOLN: 0.5000 mg | INTRAMUSCULAR | Status: DC | PRN

## 2019-01-15 MED ORDER — DEXAMETHASONE SODIUM PHOSPHATE 10 MG/ML IJ SOLN
INTRAMUSCULAR | Status: DC | PRN
  Administered 2019-01-15: 5 mg via INTRAVENOUS

## 2019-01-15 MED ORDER — LACTATED RINGERS IV SOLN: INTRAVENOUS | Status: DC | PRN

## 2019-01-15 MED ORDER — MAGNESIUM CITRATE PO SOLN: 1.0000 | Freq: Once | ORAL | Status: DC | PRN

## 2019-01-15 SURGICAL SUPPLY — 67 items
ADH SKN CLS APL DERMABOND .7 (GAUZE/BANDAGES/DRESSINGS) ×1
BAG DECANTER FOR FLEXI CONT (MISCELLANEOUS) ×3 IMPLANT
CELLS DAT CNTRL 66122 CELL SVR (MISCELLANEOUS) ×1 IMPLANT
COVER PERINEAL POST (MISCELLANEOUS) ×3 IMPLANT
COVER SURGICAL LIGHT HANDLE (MISCELLANEOUS) ×3 IMPLANT
COVER WAND RF STERILE (DRAPES) ×3 IMPLANT
CUP SECTOR GRIPTON 58MM (Orthopedic Implant) ×2 IMPLANT
DERMABOND ADVANCED (GAUZE/BANDAGES/DRESSINGS) ×2
DERMABOND ADVANCED .7 DNX12 (GAUZE/BANDAGES/DRESSINGS) IMPLANT
DRAPE C-ARM 42X72 X-RAY (DRAPES) ×3 IMPLANT
DRAPE POUCH INSTRU U-SHP 10X18 (DRAPES) ×3 IMPLANT
DRAPE STERI IOBAN 125X83 (DRAPES) ×3 IMPLANT
DRAPE U-SHAPE 47X51 STRL (DRAPES) ×6 IMPLANT
DRSG AQUACEL AG ADV 3.5X10 (GAUZE/BANDAGES/DRESSINGS) ×3 IMPLANT
DRSG MEPILEX BORDER 4X4 (GAUZE/BANDAGES/DRESSINGS) ×2 IMPLANT
DRSG MEPILEX BORDER 4X8 (GAUZE/BANDAGES/DRESSINGS) ×2 IMPLANT
DURAPREP 26ML APPLICATOR (WOUND CARE) ×6 IMPLANT
ELECT BLADE 4.0 EZ CLEAN MEGAD (MISCELLANEOUS) ×3
ELECT REM PT RETURN 9FT ADLT (ELECTROSURGICAL) ×3
ELECTRODE BLDE 4.0 EZ CLN MEGD (MISCELLANEOUS) ×1 IMPLANT
ELECTRODE REM PT RTRN 9FT ADLT (ELECTROSURGICAL) ×1 IMPLANT
GLOVE BIOGEL PI IND STRL 7.0 (GLOVE) ×1 IMPLANT
GLOVE BIOGEL PI INDICATOR 7.0 (GLOVE) ×2
GLOVE ECLIPSE 7.0 STRL STRAW (GLOVE) ×6 IMPLANT
GLOVE SKINSENSE NS SZ7.5 (GLOVE) ×2
GLOVE SKINSENSE STRL SZ7.5 (GLOVE) ×1 IMPLANT
GLOVE SURG SYN 7.5  E (GLOVE) ×8
GLOVE SURG SYN 7.5 E (GLOVE) ×4 IMPLANT
GLOVE SURG SYN 7.5 PF PI (GLOVE) ×4 IMPLANT
GOWN STRL REIN XL XLG (GOWN DISPOSABLE) ×3 IMPLANT
GOWN STRL REUS W/ TWL LRG LVL3 (GOWN DISPOSABLE) IMPLANT
GOWN STRL REUS W/ TWL XL LVL3 (GOWN DISPOSABLE) ×1 IMPLANT
GOWN STRL REUS W/TWL LRG LVL3 (GOWN DISPOSABLE)
GOWN STRL REUS W/TWL XL LVL3 (GOWN DISPOSABLE) ×3
HANDPIECE INTERPULSE COAX TIP (DISPOSABLE) ×3
HEAD CERAMIC 36 PLUS5 (Hips) ×2 IMPLANT
HOOD PEEL AWAY FLYTE STAYCOOL (MISCELLANEOUS) ×6 IMPLANT
IV NS IRRIG 3000ML ARTHROMATIC (IV SOLUTION) ×3 IMPLANT
KIT BASIN OR (CUSTOM PROCEDURE TRAY) ×3 IMPLANT
LINER NEUTRAL 36X58 PLUS4 ×2 IMPLANT
MARKER SKIN DUAL TIP RULER LAB (MISCELLANEOUS) ×3 IMPLANT
NDL SPNL 18GX3.5 QUINCKE PK (NEEDLE) ×1 IMPLANT
NEEDLE SPNL 18GX3.5 QUINCKE PK (NEEDLE) ×3 IMPLANT
PACK TOTAL JOINT (CUSTOM PROCEDURE TRAY) ×3 IMPLANT
PACK UNIVERSAL I (CUSTOM PROCEDURE TRAY) ×3 IMPLANT
RETRACTOR WND ALEXIS 18 MED (MISCELLANEOUS) IMPLANT
RTRCTR WOUND ALEXIS 18CM MED (MISCELLANEOUS) ×3
SAW OSC TIP CART 19.5X105X1.3 (SAW) ×3 IMPLANT
SCREW 6.5MMX25MM (Screw) ×2 IMPLANT
SET HNDPC FAN SPRY TIP SCT (DISPOSABLE) ×1 IMPLANT
STAPLER VISISTAT 35W (STAPLE) IMPLANT
STEM FEMORAL SZ8 STD ACTIS (Stem) ×2 IMPLANT
SUT ETHIBOND 2 V 37 (SUTURE) ×3 IMPLANT
SUT ETHILON 2 0 FS 18 (SUTURE) ×2 IMPLANT
SUT VIC AB 0 CT1 27 (SUTURE) ×3
SUT VIC AB 0 CT1 27XBRD ANBCTR (SUTURE) ×1 IMPLANT
SUT VIC AB 1 CTX 36 (SUTURE) ×3
SUT VIC AB 1 CTX36XBRD ANBCTR (SUTURE) ×1 IMPLANT
SUT VIC AB 2-0 CT1 (SUTURE) ×4 IMPLANT
SUT VIC AB 2-0 CT1 27 (SUTURE) ×6
SUT VIC AB 2-0 CT1 TAPERPNT 27 (SUTURE) ×2 IMPLANT
SYR 50ML LL SCALE MARK (SYRINGE) ×3 IMPLANT
TOWEL GREEN STERILE (TOWEL DISPOSABLE) ×3 IMPLANT
TRAY CATH 16FR W/PLASTIC CATH (SET/KITS/TRAYS/PACK) IMPLANT
TRAY FOLEY W/BAG SLVR 16FR (SET/KITS/TRAYS/PACK) ×3
TRAY FOLEY W/BAG SLVR 16FR ST (SET/KITS/TRAYS/PACK) ×1 IMPLANT
YANKAUER SUCT BULB TIP NO VENT (SUCTIONS) ×3 IMPLANT

## 2019-01-15 NOTE — Op Note (Signed)
LEFT TOTAL HIP ARTHROPLASTY ANTERIOR APPROACH  Procedure Note Wayne Jordan   [id number]  Pre-op Diagnosis: left hip degenerative joint disease     Post-op Diagnosis: same   Operative Procedures  1. Total hip replacement; Left hip; uncemented [id number]   Personnel  Surgeon(s): Tarry Kos, MD  Assist: Oneal Grout, PA-C; necessary for the timely completion of procedure and due to complexity of procedure.   Anesthesia: spinal  Prosthesis: Depuy Acetabulum: Pinnacle 58 mm Femur: Actis 8 STD Head: 36 mm size: +5 Liner: +4 neutral Bearing Type: ceramic on poly  Total Hip Arthroplasty (Anterior Approach) Op Note:  After informed consent was obtained and the operative extremity marked in the holding area, the patient was brought back to the operating room and placed supine on the HANA table. Next, the operative extremity was prepped and draped in normal sterile fashion. Surgical timeout occurred verifying patient identification, surgical site, surgical procedure and administration of antibiotics.  A modified anterior Smith-Peterson approach to the hip was performed, using the interval between tensor fascia lata and sartorius.  Dissection was carried bluntly down onto the anterior hip capsule. The lateral femoral circumflex vessels were identified and coagulated. A capsulotomy was performed and the capsular flaps tagged for later repair.  The neck osteotomy was performed. The femoral head was removed, the acetabular rim was cleared of soft tissue and attention was turned to reaming the acetabulum.  Sequential reaming was performed under fluoroscopic guidance. We reamed to a size 57 mm, and then impacted the acetabular shell. A single 25 mm cancellous screw was placed through the cup for extra fixation.  This had excellent fixation.  The liner was then placed after irrigation and attention turned to the femur.  After placing the femoral hook, the leg was taken to externally  rotated to 130 degrees, extended and adducted position taking care to perform soft tissue releases to allow for adequate mobilization of the femur. Soft tissue was cleared from the shoulder of the greater trochanter and the hook elevator used to improve exposure of the proximal femur. Sequential broaching performed up to a size 8. Trial neck and head were placed. The leg was brought back up to neutral and the construct reduced. The position and sizing of components, offset and leg lengths were checked using fluoroscopy. Stability of the construct was checked in extension and external rotation without any subluxation or impingement of prosthesis. We dislocated the prosthesis, dropped the leg back into position, removed trial components, and irrigated copiously. The final stem and head was then placed, the leg brought back up, the system reduced and fluoroscopy used to verify positioning.  We irrigated, obtained hemostasis and closed the capsule using #2 ethibond suture.  One gram of vancomycin powder was placed in the surgical bed. The fascia was closed with #1 vicryl plus, the deep fat layer was closed with 0 vicryl, the subcutaneous layers closed with 2.0 Vicryl Plus and the skin closed with 2.0 nylon and dermabond. A sterile dressing was applied. The patient was awakened in the operating room and taken to recovery in stable condition.  All sponge, needle, and instrument counts were correct at the end of the case.   Position: supine  Complications: see description of procedure.  Time Out: performed   Drains/Packing: none  Estimated blood loss: see anesthesia record  Returned to Recovery Room: in good condition.   Antibiotics: yes   Mechanical VTE (DVT) Prophylaxis: sequential compression devices, TED thigh-high  Chemical VTE (DVT) Prophylaxis:  aspirin   Fluid Replacement: see anesthesia record  Specimens Removed: 1 to pathology   Sponge and Instrument Count Correct? yes   PACU: portable  radiograph - low AP   Plan/RTC: Return in 2 weeks for staple removal. Weight Bearing/Load Lower Extremity: full  Hip precautions: none Suture Removal: 2 weeks   N. Eduard Roux, MD [facility] 9:14 AM   Implant Name Type Inv. Item Serial No. Manufacturer Lot No. LRB No. Used Action  CUP SECTOR GRIPTON 58MM - [id number] Orthopedic Implant CUP SECTOR GRIPTON 58MM  DEPUY SYNTHES  Left 1 Implanted  LINER NEUTRAL 57X36MM PLUS4 - [id number]  LINER NEUTRAL 57X36MM PLUS4  DEPUY SYNTHES [id number] Left 1 Implanted  SCREW 6.5MMX25MM - [id number] Screw SCREW 6.5MMX25MM  DEPUY SYNTHES [id number] Left 1 Implanted  STEM FEMORAL SZ8 STD ACTIS - [id number] Stem STEM FEMORAL SZ8 STD ACTIS  DEPUY ORTHOPAEDICS [id number] Left 1 Implanted  HEAD CERAMIC 36 PLUS5 - [id number] Hips HEAD CERAMIC 36 PLUS5  DEPUY SYNTHES [id number] Left 1 Implanted

## 2019-01-15 NOTE — Anesthesia Postprocedure Evaluation (Signed)
Anesthesia Post Note  Patient: NANCY MANUELE  Procedure(s) Performed: LEFT TOTAL HIP ARTHROPLASTY ANTERIOR APPROACH (Left Hip)     Patient location during evaluation: Nursing Unit Anesthesia Type: Spinal Level of consciousness: oriented and awake and alert Pain management: pain level controlled Vital Signs Assessment: post-procedure vital signs reviewed and stable Respiratory status: spontaneous breathing and respiratory function stable Cardiovascular status: blood pressure returned to baseline and stable Postop Assessment: no headache, no backache, no apparent nausea or vomiting and patient able to bend at knees Anesthetic complications: no    Last Vitals:  Vitals:   01/15/19 1041 01/15/19 1058  BP: 119/70 117/74  Pulse: 64 63  Resp: (!) 22 18  Temp:  36.6 C  SpO2: 97% 96%    Last Pain:  Vitals:   01/15/19 1030  TempSrc:   PainSc: 0-No pain                 Barnet Glasgow

## 2019-01-15 NOTE — Evaluation (Signed)
Physical Therapy Evaluation Patient Details Name: Wayne Jordan MRN: [id number] DOB: 1960-10-06 Today's Date: 01/15/2019   History of Present Illness  Pt is a 58 y.o. M with significant PMH of acute MI, CAD, who presents s/p left total hip replacement (anterior approach).  Clinical Impression  Pt s/p procedure listed above. Evaluation limited as pt ambulating with walker to bathroom and then becoming lightheaded/dizzy, requiring transfer into wheelchair to return back to bed. Overall, performing functional mobility at a min guard assist level. Initiated bed level HEP. Pt presents with decreased mobility secondary to left hip pain, left lower extremity weakness, and gait abnormalities. Will benefit from HHPT to address deficits and maximize functional independence. Will continue to progress as tolerated.     Follow Up Recommendations Home health PT;Supervision for mobility/OOB    Equipment Recommendations  3in1 (PT)    Recommendations for Other Services OT consult     Precautions / Restrictions Precautions Precautions: Fall Restrictions Weight Bearing Restrictions: No      Mobility  Bed Mobility Overal bed mobility: Needs Assistance Bed Mobility: Supine to Sit;Sit to Supine     Supine to sit: Supervision Sit to supine: Min assist   General bed mobility comments: Pt sitting up on edge of bed with increased time/effort in addition to use of bed rail. Guarding away from left hip. MinA for LLE negotiation back into bed  Transfers Overall transfer level: Needs assistance Equipment used: Rolling walker (2 wheeled) Transfers: Sit to/from Stand Sit to Stand: Min guard            Ambulation/Gait Ambulation/Gait assistance: Min guard Gait Distance (Feet): 8 Feet Assistive device: Rolling walker (2 wheeled) Gait Pattern/deviations: Step-to pattern;Decreased weight shift to left;Decreased stance time - left;Step-through pattern Gait velocity: decreased Gait velocity  interpretation: <1.8 ft/sec, indicate of risk for recurrent falls General Gait Details: Cues for sequencing, appropriate walker use  Stairs            Wheelchair Mobility    Modified Rankin (Stroke Patients Only)       Balance Overall balance assessment: Needs assistance Sitting-balance support: Feet supported Sitting balance-Leahy Scale: Good     Standing balance support: Bilateral upper extremity supported Standing balance-Leahy Scale: Poor Standing balance comment: reliant through arm support due to pain                             Pertinent Vitals/Pain Pain Assessment: Faces Faces Pain Scale: Hurts even more Pain Location: left hip Pain Descriptors / Indicators: Grimacing;Guarding Pain Intervention(s): Monitored during session;Limited activity within patient's tolerance;Repositioned;Patient requesting pain meds-RN notified    Home Living Family/patient expects to be discharged to:: Private residence Living Arrangements: Spouse/significant other Available Help at Discharge: Family Type of Home: House Home Access: Stairs to enter Entrance Stairs-Rails: Psychiatric nurse of Steps: 4 Home Layout: One level Home Equipment: Environmental consultant - 2 wheels      Prior Function Level of Independence: Independent         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        Extremity/Trunk Assessment   Upper Extremity Assessment Upper Extremity Assessment: Overall WFL for tasks assessed    Lower Extremity Assessment Lower Extremity Assessment: LLE deficits/detail LLE Deficits / Details: S/p left THA; able to perform quad set, limited SLR       Communication   Communication: No difficulties  Cognition Arousal/Alertness: Awake/alert Behavior During Therapy: [facility] for  tasks assessed/performed Overall Cognitive Status: Within Functional Limits for tasks assessed                                        General  Comments      Exercises General Exercises - Lower Extremity Quad Sets: Left;15 reps;Supine Heel Slides: Left;10 reps;Supine Hip ABduction/ADduction: Left;10 reps;Supine   Assessment/Plan    PT Assessment Patient needs continued PT services  PT Problem List Decreased strength;Decreased range of motion;Decreased activity tolerance;Decreased balance;Decreased mobility;Pain       PT Treatment Interventions DME instruction;Gait training;Functional mobility training;Stair training;Therapeutic activities;Therapeutic exercise;Balance training;Patient/family education    PT Goals (Current goals can be found in the Care Plan section)  Acute Rehab PT Goals Patient Stated Goal: "get home today." PT Goal Formulation: With patient Time For Goal Achievement: 01/29/19 Potential to Achieve Goals: Good    Frequency 7X/week   Barriers to discharge        Co-evaluation               AM-PAC PT "6 Clicks" Mobility  Outcome Measure Help needed turning from your back to your side while in a flat bed without using bedrails?: None Help needed moving from lying on your back to sitting on the side of a flat bed without using bedrails?: None Help needed moving to and from a bed to a chair (including a wheelchair)?: A Little Help needed standing up from a chair using your arms (e.g., wheelchair or bedside chair)?: A Little Help needed to walk in hospital room?: A Little Help needed climbing 3-5 steps with a railing? : A Lot 6 Click Score: 19    End of Session Equipment Utilized During Treatment: Gait belt Activity Tolerance: Other (comment)(limited by dizziness/lightheadedness) Patient left: in bed;with call bell/phone within reach Nurse Communication: Mobility status PT Visit Diagnosis: Unsteadiness on feet (R26.81);Pain;Difficulty in walking, not elsewhere classified (R26.2) Pain - Right/Left: Left Pain - part of body: Hip    Time: 3329-5188 PT Time Calculation (min) (ACUTE ONLY): 31  min   Charges:   PT Evaluation $PT Eval Moderate Complexity: 1 Mod PT Treatments $Gait Training: 8-22 mins        Laurina Bustle, PT, DPT Acute Rehabilitation Services Pager [phone] Office [phone]   Vanetta Mulders 01/15/2019, 12:29 PM

## 2019-01-15 NOTE — Transfer of Care (Signed)
Immediate Anesthesia Transfer of Care Note  Patient: Wayne Jordan  Procedure(s) Performed: LEFT TOTAL HIP ARTHROPLASTY ANTERIOR APPROACH (Left Hip)  Patient Location: PACU  Anesthesia Type:Spinal  Level of Consciousness: awake, alert  and oriented  Airway & Oxygen Therapy: Patient Spontanous Breathing and Patient connected to face mask oxygen  Post-op Assessment: Report given to RN and Post -op Vital signs reviewed and stable  Post vital signs: Reviewed and stable  Last Vitals:  Vitals Value Taken Time  BP 137/126 01/15/19 0941  Temp    Pulse    Resp 22 01/15/19 0942  SpO2    Vitals shown include unvalidated device data.  Last Pain:  Vitals:   01/15/19 0614  TempSrc: Oral  PainSc: 0-No pain      Patients Stated Pain Goal: 3 (97/02/63 7858)  Complications: No apparent anesthesia complications

## 2019-01-15 NOTE — Evaluation (Addendum)
Occupational Therapy Evaluation Patient Details Name: Wayne Jordan B Whalley MRN: [id number] DOB: 05/06/1960 Today's Date: 01/15/2019    History of Present Illness Pt is a 58 y.o. M with significant PMH of acute MI, CAD, who presents s/p left total hip replacement (anterior approach).   Clinical Impression   PTA patient independent. Admitted for above and limited by problem list below, including L hip pain, decreased activity tolerance and impaired balance. Pt completing transfers with min guard, LB ADLs with min assist, and in room mobility using RW with min guard.  Educated on precautions, safety, DME, recommendations, ADL compensatory techniques and AE. Simulated tub transfers with min guard assist, plans to use 3:1 as shower chair and will have spouses support.  Patient will benefit from continued OT services acutely in order to optimize independence and safety with ADLs, but anticipate no further needs after dc home.    Follow Up Recommendations  No OT follow up;Supervision - Intermittent    Equipment Recommendations  3 in 1 bedside commode    Recommendations for Other Services       Precautions / Restrictions Precautions Precautions: Fall Restrictions Weight Bearing Restrictions: No      Mobility Bed Mobility Overal bed mobility: Needs Assistance Bed Mobility: Supine to Sit     Supine to sit: Supervision     General bed mobility comments: increased time and effort but not assist required   Transfers Overall transfer level: Needs assistance Equipment used: Rolling walker (2 wheeled) Transfers: Sit to/from Stand Sit to Stand: Min guard         General transfer comment: for safety/balance    Balance Overall balance assessment: Needs assistance Sitting-balance support: No upper extremity supported;Feet supported Sitting balance-Leahy Scale: Good     Standing balance support: Bilateral upper extremity supported;During functional activity Standing balance-Leahy Scale:  Poor Standing balance comment: relaint on BUE support                           ADL either performed or assessed with clinical judgement   ADL Overall ADL's : Needs assistance/impaired     Grooming: Set up;Sitting   Upper Body Bathing: Set up;Sitting   Lower Body Bathing: Min guard;Sit to/from stand Lower Body Bathing Details (indicate cue type and reason): reviewed use of long sponge to reach L LE due to pain  Upper Body Dressing : Set up;Sitting   Lower Body Dressing: Minimal assistance;Sit to/from stand Lower Body Dressing Details (indicate cue type and reason): min guard sit to stand, reviewed use of AE for LB dressing (reacher, sock aide, long shoe horn), as well as compensatory techniques for dressing; min assist due to L LE pain/ROM Toilet Transfer: Min guard;Ambulation;RW Toilet Transfer Details (indicate cue type and reason): educated on use of 3:1 and commode extender  Toileting- Clothing Manipulation and Hygiene: Min guard;Sit to/from stand   Tub/ Shower Transfer: Tub transfer;Min guard;Ambulation;3 in 1;Rolling walker Tub/Shower Transfer Details (indicate cue type and reason): simulated tub transfer in room, educated on reverse step transfer over threshold using RW and use of 3:1 as [place]  Functional mobility during ADLs: Min guard;Rolling walker General ADL Comments: pt limited by pain in L hip      Vision   Vision Assessment?: No apparent visual deficits     Perception     Praxis      Pertinent Vitals/Pain Pain Assessment: Faces Faces Pain Scale: Hurts little more Pain Location: left hip Pain Descriptors / Indicators: Grimacing;Guarding  Pain Intervention(s): Monitored during session;Repositioned     Hand Dominance     Extremity/Trunk Assessment Upper Extremity Assessment Upper Extremity Assessment: Overall WFL for tasks assessed   Lower Extremity Assessment Lower Extremity Assessment: Defer to PT evaluation(s/p L THA)       Communication  Communication Communication: No difficulties   Cognition Arousal/Alertness: Awake/alert Behavior During Therapy: WFL for tasks assessed/performed Overall Cognitive Status: Within Functional Limits for tasks assessed                                     General Comments       Exercises     Shoulder Instructions      Home Living Family/patient expects to be discharged to:: Private residence Living Arrangements: Spouse/significant other Available Help at Discharge: Family Type of Home: House Home Access: Stairs to enter Secretary/administrator of Steps: 4 Entrance Stairs-Rails: Right;Left Home Layout: One level     Bathroom Shower/Tub: Chief Strategy Officer: Standard     Home Equipment: Environmental consultant - 2 wheels          Prior Functioning/Environment Level of Independence: Independent        Comments: Works as a Education officer, community Problem List: Decreased activity tolerance;Impaired balance (sitting and/or standing);Decreased knowledge of use of DME or AE;Decreased knowledge of precautions;Pain      OT Treatment/Interventions: Self-care/ADL training;DME and/or AE instruction;Therapeutic activities;Patient/family education;Balance training    OT Goals(Current goals can be found in the care plan section) Acute Rehab OT Goals Patient Stated Goal: "get home today." OT Goal Formulation: With patient Time For Goal Achievement: 01/29/19 Potential to Achieve Goals: Good ADL Goals Pt Will Perform Grooming: with modified independence;standing Pt Will Perform Lower Body Dressing: with modified independence;sit to/from stand;with adaptive equipment Pt Will Transfer to Toilet: with modified independence;ambulating Pt Will Perform Toileting - Clothing Manipulation and hygiene: with modified independence;sit to/from stand Pt Will Perform Tub/Shower Transfer: Tub transfer;with modified independence;ambulating;3 in 1;rolling walker  OT Frequency: Min  2X/week   Barriers to D/C:            Co-evaluation              AM-PAC OT "6 Clicks" Daily Activity     Outcome Measure Help from another person eating meals?: None Help from another person taking care of personal grooming?: A Little Help from another person toileting, which includes using toliet, bedpan, or urinal?: A Little Help from another person bathing (including washing, rinsing, drying)?: A Little Help from another person to put on and taking off regular upper body clothing?: None Help from another person to put on and taking off regular lower body clothing?: A Little 6 Click Score: 20   End of Session Equipment Utilized During Treatment: Rolling walker;Gait belt Nurse Communication: Mobility status  Activity Tolerance: Patient tolerated treatment well Patient left: with call bell/phone within reach;Other (comment)(seated EOB, handoff to PT)  OT Visit Diagnosis: Unsteadiness on feet (R26.81);Pain Pain - Right/Left: Left Pain - part of body: Hip                Time: 2119-4174 OT Time Calculation (min): 23 min Charges:  OT General Charges $OT Visit: 1 Visit OT Evaluation $OT Eval Moderate Complexity: 1 Mod OT Treatments $Self Care/Home Management : 8-22 mins  Barry Brunner, OT Acute Rehabilitation Services Pager [phone] Office [phone]   Lorene Dy  S Jaidyn Usery 01/15/2019, 4:30 PM

## 2019-01-15 NOTE — Anesthesia Procedure Notes (Signed)
Procedure Name: MAC Date/Time: 01/15/2019 7:24 AM Performed by: Trinna Post., CRNA Pre-anesthesia Checklist: Patient identified, Emergency Drugs available, Suction available, Patient being monitored and Timeout performed Patient Re-evaluated:Patient Re-evaluated prior to induction Oxygen Delivery Method: Simple face mask Preoxygenation: Pre-oxygenation with 100% oxygen Induction Type: IV induction Placement Confirmation: positive ETCO2

## 2019-01-15 NOTE — H&P (Signed)
PREOPERATIVE H&P  Chief Complaint: left hip degenerative joint disease  HPI: Wayne Jordan is a 58 y.o. male who presents for surgical treatment of left hip degenerative joint disease.  He denies any changes in medical history.  Past Medical History:  Diagnosis Date  . 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 Coronary artery disease    s/P PCI of the LAD with DES.  Nuclear stress test 04/2014 with no ischemia  . Hypercholesteremia   . Hyperlipidemia   . OSA on CPAP    followed by Dr. Andrew Au  . Personal history of urinary calculi   . 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    Past Surgical History:  Procedure Laterality Date  . INGUINAL HERNIA REPAIR Right 03/08/2012   Procedure: HERNIA REPAIR INGUINAL ADULT;  Surgeon: Ralene Ok, MD;  Location: [facility];  Service: General;  Laterality: Right;  . INSERTION OF MESH N/A 03/08/2012   Procedure: INSERTION OF MESH;  Surgeon: Ralene Ok, MD;  Location: [facility];  Service: General;  Laterality: N/A;  . LEFT HEART CATHETERIZATION WITH CORONARY ANGIOGRAM Right 10/07/2012   Procedure: LEFT HEART CATHETERIZATION WITH CORONARY ANGIOGRAM;  Surgeon: Jettie Booze, MD;  Location: [facility] CATH LAB;  Service: Cardiovascular;  Laterality: Right;  . METACARPAL OSTEOTOMY Left 11/01/2018   Procedure: Left small finger osteophyte removal;  Surgeon: Leandrew Koyanagi, MD;  Location: [facility];  Service: Orthopedics;  Laterality: Left;  . PERCUTANEOUS STENT INTERVENTION  10/07/2012   Procedure: PERCUTANEOUS STENT INTERVENTION;  Surgeon: Jettie Booze, MD;  Location: [name] CATH LAB;  Service: Cardiovascular;;   Social History   Socioeconomic History  . Marital status: Married    Spouse name: Not on file  . Number of children: Not on file  . Years of education: Not on file  . Highest education level: Not on file  Occupational History  . Not on file  Tobacco Use  . Smoking status: Former Smoker   Packs/day: 1.50    Years: 7.00    Pack years: 10.50    Types: Cigarettes    Quit date: 10/01/2012    Years since quitting: 6.2  . Smokeless tobacco: Never Used  Substance and Sexual Activity  . Alcohol use: Not Currently    Comment: occassionally  . Drug use: No  . Sexual activity: Not on file  Other Topics Concern  . Not on file  Social History Narrative   Drinks 2 cheerwines daily.   Social Determinants of Health   Financial Resource Strain:   . Difficulty of Paying Living Expenses: Not on file  Food Insecurity:   . Worried About Charity fundraiser in the Last Year: Not on file  . Ran Out of Food in the Last Year: Not on file  Transportation Needs:   . Lack of Transportation (Medical): Not on file  . Lack of Transportation (Non-Medical): Not on file  Physical Activity:   . Days of Exercise per Week: Not on file  . Minutes of Exercise per Session: Not on file  Stress:   . Feeling of Stress : Not on file  Social Connections:   . Frequency of Communication with Friends and Family: Not on file  . Frequency of Social Gatherings with Friends and Family: Not on file  . Attends Religious Services: Not on file  . Active Member of Clubs or Organizations: Not on file  . Attends Archivist Meetings: Not on file  .  Marital Status: Not on file   Family History  Problem Relation Age of Onset  . Heart disease Father   . Heart attack Father   . CAD Father    Allergies  Allergen Reactions  . Coconut Fatty Acids Shortness Of Breath and Swelling  . Penicillins Hives and Rash    Did it involve swelling of the face/tongue/throat, SOB, or low BP? Yes Did it involve sudden or severe rash/hives, skin peeling, or any reaction on the inside of your mouth or nose? No Did you need to seek medical attention at a hospital or doctor's office? No When did it last happen?unknown  If all above answers are "NO", may proceed with cephalosporin use.    Prior to Admission  medications   Medication Sig Start Date End Date Taking? Authorizing Provider  ascorbic acid (VITAMIN C) 500 MG tablet Take 500-1,000 mg by mouth daily.    Yes [provider]  aspirin 81 MG chewable tablet Chew 81 mg by mouth every evening.    Yes [provider]  atorvastatin (LIPITOR) 80 MG tablet Take 1 tablet (80 mg total) by mouth daily after breakfast. Patient taking differently: Take 80 mg by mouth daily as needed.  09/13/18  Yes Georgeanna Lea, MD  clopidogrel (PLAVIX) 75 MG tablet TAKE 1 TABLET DAILY Patient taking differently: Take 75 mg by mouth daily.  09/13/18  Yes Georgeanna Lea, MD  metoprolol tartrate (LOPRESSOR) 25 MG tablet TAKE 1 TABLET TWICE A DAY Patient taking differently: Take 25 mg by mouth 2 (two) times daily.  09/13/18  Yes Georgeanna Lea, MD  Multiple Vitamins-Minerals (MULTIVITAMIN WITH MINERALS) tablet Take 1 tablet by mouth every evening.   Yes [provider]  nitroGLYCERIN (NITROSTAT) 0.4 MG SL tablet Place 1 tablet (0.4 mg total) under the tongue every 5 (five) minutes x 3 doses as needed for chest pain. 02/16/16  Yes Turner, Cornelious Bryant, MD     Positive ROS: All other systems have been reviewed and were otherwise negative with the exception of those mentioned in the HPI and as above.  Physical Exam: General: Alert, no acute distress Cardiovascular: No pedal edema Respiratory: No cyanosis, no use of accessory musculature GI: abdomen soft Skin: No lesions in the area of chief complaint Neurologic: Sensation intact distally Psychiatric: Patient is competent for consent with normal mood and affect Lymphatic: no lymphedema  MUSCULOSKELETAL: exam stable  Assessment: left hip degenerative joint disease  Plan: Plan for Procedure(s): LEFT TOTAL HIP ARTHROPLASTY ANTERIOR APPROACH  The risks benefits and alternatives were discussed with the patient including but not limited to the risks of nonoperative treatment, versus  surgical intervention including infection, bleeding, nerve injury,  blood clots, cardiopulmonary complications, morbidity, mortality, among others, and they were willing to proceed.   Preoperative templating of the joint replacement has been completed, documented, and submitted to the Operating Room personnel in order to optimize intra-operative equipment management.  Glee Arvin, MD   01/15/2019 6:50 AM

## 2019-01-15 NOTE — Progress Notes (Signed)
Patient is discharged from room 3C07 at this time. Alert and in stable condition. IV site d/c'd and instructions read to patient and spouse with understanding verbalized. Left unit via wheelchair with all belongings at side. 

## 2019-01-15 NOTE — Progress Notes (Signed)
Physical Therapy Treatment Patient Details Name: Wayne Jordan MRN: [id number] DOB: 05-08-1960 Today's Date: 01/15/2019    History of Present Illness Pt is a 58 y.o. M with significant PMH of acute MI, CAD, who presents s/p left total hip replacement (anterior approach).    PT Comments    Pt making steady progress towards physical therapy goals. Pt reporting dizziness/lightheadedness has resolved; stating he has mild left hip and glute pain (premedicated prior to session). Ambulating 60 feet with walker at a min guard assist level; negotiated 4 steps to prepare for discharge home. Displays LLE weakness and gait abnormalities. Education provided re: car transfer technique, cryotherapy, activity recommendations and progression, and HEP reviewed (written handout provided). See below for recommendations.   Follow Up Recommendations  Home health PT;Supervision for mobility/OOB (could likely transition fairly quickly to OPPT...)     Equipment Recommendations  3in1 (PT)    Recommendations for Other Services       Precautions / Restrictions Precautions Precautions: Fall Restrictions Weight Bearing Restrictions: No    Mobility  Bed Mobility Overal bed mobility: Needs Assistance Bed Mobility: Supine to Sit     Supine to sit: Supervision     General bed mobility comments: Sitting EOB upon arrival  Transfers Overall transfer level: Needs assistance Equipment used: Rolling walker (2 wheeled) Transfers: Sit to/from Stand Sit to Stand: Min guard         General transfer comment: for safety/balance  Ambulation/Gait Ambulation/Gait assistance: Min guard Gait Distance (Feet): 60 Feet Assistive device: Rolling walker (2 wheeled) Gait Pattern/deviations: Decreased weight shift to left;Decreased stance time - left;Step-through pattern;Decreased dorsiflexion - left;Antalgic Gait velocity: decreased   General Gait Details: Pt with improved sequencing, gait speed. Cues for walker  etiquette i.e. rolling walker rather than picking up with each step. Pt self cueing for left heel strike at initial contact   Stairs Stairs: Yes Stairs assistance: Min guard Stair Management: One rail Right Number of Stairs: 4 General stair comments: Cues for sequencing, step by step pattern   Wheelchair Mobility    Modified Rankin (Stroke Patients Only)       Balance Overall balance assessment: Needs assistance Sitting-balance support: No upper extremity supported;Feet supported Sitting balance-Leahy Scale: Good     Standing balance support: Bilateral upper extremity supported;During functional activity Standing balance-Leahy Scale: Fair Standing balance comment: relaint on BUE support                            Cognition Arousal/Alertness: Awake/alert Behavior During Therapy: WFL for tasks assessed/performed Overall Cognitive Status: Within Functional Limits for tasks assessed                                        Exercises General Exercises - Lower Extremity Long Arc Quad: Left;15 reps;Seated    General Comments        Pertinent Vitals/Pain Pain Assessment: 0-10 Faces Pain Scale: Hurts a little bit Pain Location: left hip Pain Descriptors / Indicators: Grimacing;Guarding Pain Intervention(s): Monitored during session;Premedicated before session    Home Living Family/patient expects to be discharged to:: Private residence Living Arrangements: Spouse/significant other Available Help at Discharge: Family Type of Home: House Home Access: Stairs to enter Entrance Stairs-Rails: Right;Left Home Layout: One level Home Equipment: Environmental consultant - 2 wheels      Prior Function Level of Independence: Independent  Comments: Works as a Chemical engineer (current goals can now be found in the care plan section) Acute Rehab PT Goals Patient Stated Goal: "get home today." Potential to Achieve Goals: Good Progress towards PT goals:  Progressing toward goals    Frequency    7X/week      PT Plan Current plan remains appropriate    Co-evaluation              AM-PAC PT "6 Clicks" Mobility   Outcome Measure  Help needed turning from your back to your side while in a flat bed without using bedrails?: None Help needed moving from lying on your back to sitting on the side of a flat bed without using bedrails?: None Help needed moving to and from a bed to a chair (including a wheelchair)?: A Little Help needed standing up from a chair using your arms (e.g., wheelchair or bedside chair)?: A Little Help needed to walk in hospital room?: A Little Help needed climbing 3-5 steps with a railing? : A Little 6 Click Score: 20    End of Session Equipment Utilized During Treatment: Gait belt Activity Tolerance: Patient tolerated treatment well Patient left: in bed;with call bell/phone within reach;with nursing/sitter in room Nurse Communication: Mobility status PT Visit Diagnosis: Unsteadiness on feet (R26.81);Pain;Difficulty in walking, not elsewhere classified (R26.2) Pain - Right/Left: Left Pain - part of body: Hip     Time: 3335-4562 PT Time Calculation (min) (ACUTE ONLY): 19 min  Charges:  $Gait Training: 8-22 mins                     Ellamae Sia, PT, DPT Acute Rehabilitation Services Pager [phone] Office [phone]    Willy Eddy 01/15/2019, 5:10 PM

## 2019-01-15 NOTE — Anesthesia Procedure Notes (Addendum)
Spinal  Start time: 01/15/2019 7:20 AM End time: 01/15/2019 7:28 AM Staffing Anesthesiologist: Barnet Glasgow, MD Spinal Block Patient position: sitting Prep: DuraPrep Patient monitoring: cardiac monitor and blood pressure Approach: midline Location: L4-5 Injection technique: single-shot Needle Needle type: Pencan  Needle gauge: 24 G Needle length: 10 cm Needle insertion depth: 8 cm Assessment Sensory level: T4 Additional Notes I attempt . Pt tolerated procedure well.

## 2019-01-16 ENCOUNTER — Telehealth: Payer: Self-pay | Admitting: *Deleted

## 2019-01-16 ENCOUNTER — Telehealth: Payer: Self-pay | Admitting: Orthopaedic Surgery

## 2019-01-16 NOTE — Telephone Encounter (Signed)
RNCM Call to check status after same day discharge from hospital on 01/15/19 S/P Left THA with Dr. Erlinda Hong. He states he is doing well and having minimal to moderate pain. Was able to get all medications prescribed for home per Dr. Erlinda Hong. CM reviewed these with the patient. He has not yet heard from therapy today. Will contact therapy liaison to remind that patient did go home same day as surgery yesterday. Anticipate someone being in contact with him today. Patient noted that he does not yet have a post-op appointment with Dr. Erlinda Hong. Appointment scheduled via scheduler for 01/30/19 at 2:15 pm. Reminded to contact office for any other needs/questions/concerns.

## 2019-01-16 NOTE — Telephone Encounter (Signed)
Princess from [employer] called. She is requesting an attending physician statement on behalf of Mr. Ziller. She says the request has been pending for 41 days.   Call back number: [phone]  Ticket Number: [id number]

## 2019-01-17 ENCOUNTER — Encounter: Payer: Self-pay | Admitting: *Deleted

## 2019-01-22 NOTE — Telephone Encounter (Signed)
Is this something that can be sent without signed release?

## 2019-01-23 ENCOUNTER — Telehealth: Payer: Self-pay | Admitting: Orthopaedic Surgery

## 2019-01-23 ENCOUNTER — Other Ambulatory Visit: Payer: Self-pay | Admitting: Physician Assistant

## 2019-01-23 MED ORDER — OXYCODONE-ACETAMINOPHEN 5-325 MG PO TABS: 1.0000 | ORAL_TABLET | Freq: Three times a day (TID) | ORAL | 0 refills | Status: DC | PRN

## 2019-01-23 MED ORDER — METHOCARBAMOL 750 MG PO TABS: 750.0000 mg | ORAL_TABLET | Freq: Two times a day (BID) | ORAL | 3 refills | Status: DC | PRN

## 2019-01-23 NOTE — Telephone Encounter (Signed)
Sent in

## 2019-01-23 NOTE — Telephone Encounter (Signed)
Ic, advised this was done by [facility], and gave number to followup with [facility]

## 2019-01-23 NOTE — Telephone Encounter (Signed)
Patient called. Repeat call. Needs a refill on his oxycodone and methocarbamol.   Call back number: [phone]

## 2019-01-23 NOTE — Telephone Encounter (Signed)
Patient called needing Rx refilled Methocarbamol and Oxycodone 5 mg. The number to contact patient is [phone]

## 2019-01-24 ENCOUNTER — Other Ambulatory Visit: Payer: Self-pay | Admitting: Physician Assistant

## 2019-01-24 NOTE — Telephone Encounter (Signed)
I called both in yesterday

## 2019-01-25 NOTE — Discharge Summary (Signed)
Patient ID: Wayne Jordan MRN: [id number] DOB/AGE: 03/15/1960 59 y.o.  Admit date: 01/15/2019 Discharge date: 01/25/2019  Admission Diagnoses:  Primary osteoarthritis of left hip  Discharge Diagnoses:  Principal Problem:   Primary osteoarthritis of left hip Active Problems:   Status post total replacement of left hip   Past Medical History:  Diagnosis Date  . 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 Coronary artery disease    s/P PCI of the LAD with DES.  Nuclear stress test 04/2014 with no ischemia  . Hypercholesteremia   . Hyperlipidemia   . OSA on CPAP    followed by Dr. Jessie Foot  . Personal history of urinary calculi   . 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     Surgeries: Procedure(s): LEFT TOTAL HIP ARTHROPLASTY ANTERIOR APPROACH on 01/15/2019   Consultants (if any):   Discharged Condition: Improved  Hospital Course: Wayne Jordan is an 59 y.o. male who was admitted 01/15/2019 with a diagnosis of Primary osteoarthritis of left hip and went to the operating room on 01/15/2019 and underwent the above named procedures.    He was given perioperative antibiotics:  Anti-infectives (From admission, onward)   Start     Dose/Rate Route Frequency Ordered Stop   01/15/19 1400  clindamycin (CLEOCIN) IVPB 900 mg  Status:  Discontinued     900 mg 100 mL/hr over 30 Minutes Intravenous Every 8 hours 01/15/19 1133 01/15/19 1713   01/15/19 1100  ceFAZolin (ANCEF) IVPB 2g/100 mL premix  Status:  Discontinued     2 g 200 mL/hr over 30 Minutes Intravenous Every 6 hours 01/15/19 1052 01/15/19 1132   01/15/19 0903  vancomycin (VANCOCIN) powder  Status:  Discontinued       As needed 01/15/19 0903 01/15/19 0937   01/15/19 0630  clindamycin (CLEOCIN) IVPB 900 mg     900 mg 100 mL/hr over 30 Minutes Intravenous To ShortStay Surgical 01/11/19 0917 01/15/19 0735    .  He was given sequential compression devices, early ambulation, and appropriate  chemoprophylaxis for DVT prophylaxis.  He benefited maximally from the hospital stay and there were no complications.    Recent vital signs:  Vitals:   01/15/19 1058 01/15/19 1501  BP: 117/74 127/71  Pulse: 63 81  Resp: 18 18  Temp: 97.8 F (36.6 C) (!) 97.5 F (36.4 C)  SpO2: 96% 93%    Recent laboratory studies:  Lab Results  Component Value Date   HGB 15.8 01/08/2019   HGB 15.3 07/30/2018   HGB 15.1 04/11/2015   Lab Results  Component Value Date   WBC 6.5 01/08/2019   PLT 206 01/08/2019   Lab Results  Component Value Date   INR 1.0 01/08/2019   Lab Results  Component Value Date   NA 141 01/08/2019   K 4.4 01/08/2019   CL 108 01/08/2019   CO2 26 01/08/2019   BUN 11 01/08/2019   CREATININE 1.16 01/08/2019   GLUCOSE 100 (H) 01/08/2019    Discharge Medications:   Allergies as of 01/15/2019      Reactions   Coconut Fatty Acids Shortness Of Breath, Swelling   Penicillins Hives, Rash   Did it involve swelling of the face/tongue/throat, SOB, or low BP? Yes Did it involve sudden or severe rash/hives, skin peeling, or any reaction on the inside of your mouth or nose? No Did you need to seek medical attention at a hospital or doctor's office? No When did it  last happen?unknown  If all above answers are "NO", may proceed with cephalosporin use.      Medication List    STOP taking these medications   aspirin 81 MG chewable tablet     TAKE these medications   ascorbic acid 500 MG tablet Commonly known as: VITAMIN C Take 500-1,000 mg by mouth daily.   atorvastatin 80 MG tablet Commonly known as: LIPITOR Take 1 tablet (80 mg total) by mouth daily after breakfast. What changed:   when to take this  reasons to take this   clopidogrel 75 MG tablet Commonly known as: PLAVIX TAKE 1 TABLET DAILY   metoprolol tartrate 25 MG tablet Commonly known as: LOPRESSOR TAKE 1 TABLET TWICE A DAY   multivitamin with minerals tablet Take 1 tablet by mouth  every evening.   nitroGLYCERIN 0.4 MG SL tablet Commonly known as: NITROSTAT Place 1 tablet (0.4 mg total) under the tongue every 5 (five) minutes x 3 doses as needed for chest pain.   ondansetron 4 MG tablet Commonly known as: ZOFRAN Take 1-2 tablets (4-8 mg total) by mouth every 8 (eight) hours as needed for nausea or vomiting.   oxyCODONE 5 MG immediate release tablet Commonly known as: Oxy IR/ROXICODONE Take 1-2 tablets (5-10 mg total) by mouth every 8 (eight) hours as needed for severe pain.     ASK your doctor about these medications   oxyCODONE 10 mg 12 hr tablet Commonly known as: OXYCONTIN Take 1 tablet (10 mg total) by mouth every 12 (twelve) hours for 3 days. Ask about: Should I take this medication?       Diagnostic Studies: DG Chest 2 View  Result Date: 01/08/2019 CLINICAL DATA:  Preoperative for osteoarthritis of the left hip. History of coronary artery disease. EXAM: CHEST - 2 VIEW COMPARISON:  10/06/2012 FINDINGS: Low lung volumes are present, causing crowding of the pulmonary vasculature. Mild central airway thickening. Subsegmental atelectasis at the left lung base. Lungs appear otherwise clear. Heart size within normal limits given the projection. No pleural effusion. IMPRESSION: 1. Central airway thickening, cannot exclude reactive airways or mild bronchitis. 2. Low lung volumes. 3. Subsegmental atelectasis at the left lung base. Electronically Signed   By: Van Clines M.D.   On: 01/08/2019 16:39   DG Pelvis Portable  Result Date: 01/15/2019 CLINICAL DATA:  Left hip replacement EXAM: PORTABLE PELVIS 1-2 VIEWS COMPARISON:  Fluoroscopy earlier today FINDINGS: Total left hip arthroplasty. No evidence of periprosthetic fracture when accounting for a pre-existing spur from the inferior acetabulum, accentuated due to leftward rotation. The prosthesis is located. IMPRESSION: Total left hip arthroplasty without complicating feature. Electronically Signed   By:  Monte Fantasia M.D.   On: 01/15/2019 10:09   DG C-Arm 1-60 Min  Result Date: 01/15/2019 CLINICAL DATA:  Left hip arthroplasty. EXAM: OPERATIVE left HIP (WITH PELVIS IF PERFORMED) 4 VIEWS TECHNIQUE: Fluoroscopic spot image(s) were submitted for interpretation post-operatively. COMPARISON:  09/20/2018. FINDINGS: 4 fluoroscopic spot views of the left hip show placement of a left total hip arthroplasty without complicating feature. IMPRESSION: Left total hip arthroplasty without complicating feature. Electronically Signed   By: Lorin Picket M.D.   On: 01/15/2019 09:33   DG HIP OPERATIVE UNILAT W OR W/O PELVIS LEFT  Result Date: 01/15/2019 CLINICAL DATA:  Left hip arthroplasty. EXAM: OPERATIVE left HIP (WITH PELVIS IF PERFORMED) 4 VIEWS TECHNIQUE: Fluoroscopic spot image(s) were submitted for interpretation post-operatively. COMPARISON:  09/20/2018. FINDINGS: 4 fluoroscopic spot views of the left hip show placement of  a left total hip arthroplasty without complicating feature. IMPRESSION: Left total hip arthroplasty without complicating feature. Electronically Signed   By: Leanna Battles M.D.   On: 01/15/2019 09:33    Disposition: Discharge disposition: 01-Home or Self Care         Follow-up Information    Tarry Kos, MD In 2 weeks.   Specialty: Orthopedic Surgery Why: For suture removal, For wound re-check Contact information: [address] [place] [address] [phone]            Signed: Glee Arvin 01/25/2019, 7:04 AM

## 2019-01-30 ENCOUNTER — Encounter: Payer: Self-pay | Admitting: Orthopaedic Surgery

## 2019-01-30 ENCOUNTER — Other Ambulatory Visit: Payer: Self-pay

## 2019-01-30 ENCOUNTER — Ambulatory Visit (INDEPENDENT_AMBULATORY_CARE_PROVIDER_SITE_OTHER): Payer: BC Managed Care – PPO | Admitting: Physician Assistant

## 2019-01-30 DIAGNOSIS — Z96642 Presence of left artificial hip joint: Secondary | ICD-10-CM

## 2019-01-30 NOTE — Progress Notes (Signed)
Post-Op Visit Note   Patient: Wayne Jordan           Date of Birth: 1960/02/01           MRN: [id number] Visit Date: 01/30/2019 PCP: Maury Dus, MD   Assessment & Plan:  Chief Complaint:  Chief Complaint  Patient presents with  . Left Hip - Pain   Visit Diagnoses:  1. Status post total hip replacement, left     Plan: Patient is a pleasant 59 year old gentleman who presents our clinic today 2 weeks status post left anterior total hip replacement 01/15/2019.  Has been doing fairly well.  He has been working with home health physical therapy as well as work on home exercise program.  He is ambulating with a walker.  He is still having trouble with hip flexion.  No fevers or chills.  Mild to moderate pain which is relieved with Tylenol narcotic pain medication.  Examination of his left hip reveals a well healing surgical incision with nylon sutures in place.  No evidence of infection or cellulitis.  Calf is soft nontender.  He is neurovascular intact distally.  At this point, nylon sutures were removed and Steri-Strips applied.  We will start him in outpatient physical therapy.  Internal prescription as well as hard copy provided to the patient.  Dental prophylaxis reinforced.  Follow-up with [place] in 4 weeks time for AP pelvis lateral left hip x-rays.  Follow-Up Instructions: Return in about 4 weeks (around 02/27/2019).   Orders:  Orders Placed This Encounter  Procedures  . Ambulatory referral to Physical Therapy   No orders of the defined types were placed in this encounter.   Imaging: No new imaging  PMFS History: Patient Active Problem List   Diagnosis Date Noted  . Status post total replacement of left hip 01/15/2019  . Primary osteoarthritis of right hip 11/21/2018  . Primary osteoarthritis of left hip 09/20/2018  . Nondisplaced fracture of proximal phalanx of left great toe, initial encounter for closed fracture 08/01/2018  . Displaced fracture of proximal phalanx of  left little finger with routine healing 08/01/2018  . OSA on CPAP 05/26/2015  . Morbid obesity due to excess calories ([place]) 05/26/2015  . CAD- LAD DES in setting of NSTEMI Sept 2014 04/06/2013  . Mixed hyperlipidemia 04/06/2013  . Old MI (myocardial infarction) 04/06/2013  . Encounter for long-term (current) use of other medications 04/06/2013  . Obesity, unspecified 10/08/2012   Past Medical History:  Diagnosis Date  . 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 Coronary artery disease    s/P PCI of the LAD with DES.  Nuclear stress test 04/2014 with no ischemia  . Hypercholesteremia   . Hyperlipidemia   . OSA on CPAP    followed by Dr. Andrew Au  . Personal history of urinary calculi   . 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     Family History  Problem Relation Age of Onset  . Heart disease Father   . Heart attack Father   . CAD Father     Past Surgical History:  Procedure Laterality Date  . INGUINAL HERNIA REPAIR Right 03/08/2012   Procedure: HERNIA REPAIR INGUINAL ADULT;  Surgeon: Ralene Ok, MD;  Location: [place];  Service: General;  Laterality: Right;  . INSERTION OF MESH N/A 03/08/2012   Procedure: INSERTION OF MESH;  Surgeon: Ralene Ok, MD;  Location: [place];  Service: General;  Laterality: N/A;  . [place]  WITH CORONARY ANGIOGRAM Right 10/07/2012   Procedure: LEFT HEART CATHETERIZATION WITH CORONARY ANGIOGRAM;  Surgeon: Corky Crafts, MD;  Location[facility] CATH LAB;  Service: Cardiovascular;  Laterality: Right;  . METACARPAL OSTEOTOMY Left 11/01/2018   Procedure: Left small finger osteophyte removal;  Surgeon: Tarry Kos, MD;  Location: [facility];  Service: Orthopedics;  Laterality: Left;  . PERCUTANEOUS STENT INTERVENTION  10/07/2012   Procedure: PERCUTANEOUS STENT INTERVENTION;  Surgeon: Corky Crafts, MD;  Location: [facility] CATH LAB;  Service: Cardiovascular;;  . TOTAL HIP ARTHROPLASTY Left  01/15/2019   Procedure: LEFT TOTAL HIP ARTHROPLASTY ANTERIOR APPROACH;  Surgeon: Tarry Kos, MD;  [facility] OR;  Service: Orthopedics;  Laterality: Left;   Social History   Occupational History  . Not on file  Tobacco Use  . Smoking status: Former Smoker    Packs/day: 1.50    Years: 7.00    Pack years: 10.50    Types: Cigarettes    Quit date: 10/01/2012    Years since quitting: 6.3  . Smokeless tobacco: Never Used  Substance and Sexual Activity  . Alcohol use: Not Currently    Comment: occassionally  . Drug use: No  . Sexual activity: Not on file

## 2019-02-08 ENCOUNTER — Telehealth: Payer: Self-pay | Admitting: Orthopaedic Surgery

## 2019-02-08 NOTE — Telephone Encounter (Signed)
New APS forms received from [facility]- sent to [employer]

## 2019-02-14 ENCOUNTER — Telehealth: Payer: Self-pay | Admitting: Orthopaedic Surgery

## 2019-02-14 NOTE — Telephone Encounter (Signed)
Patient called. He has questions for [place]. His call back number is [phone]. He would like for her to call him.

## 2019-02-15 NOTE — Telephone Encounter (Signed)
I addended note from 9/18

## 2019-02-15 NOTE — Telephone Encounter (Signed)
Which note should this be addended to?

## 2019-02-15 NOTE — Telephone Encounter (Signed)
Can you make addendum in note so that the [employer] people can update this. When done let me know so I can forward to [employer]. Thank you.

## 2019-02-15 NOTE — Telephone Encounter (Signed)
Sure.  Which note do you think?

## 2019-02-15 NOTE — Telephone Encounter (Signed)
Any of the previous notes should be fine
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
## 2019-02-16 NOTE — Telephone Encounter (Signed)
Spoke to [employer] and states she will update forms and send to [place] so that Mardella Layman can sign off.

## 2019-02-27 ENCOUNTER — Ambulatory Visit (INDEPENDENT_AMBULATORY_CARE_PROVIDER_SITE_OTHER): Payer: BC Managed Care – PPO

## 2019-02-27 ENCOUNTER — Other Ambulatory Visit: Payer: Self-pay

## 2019-02-27 ENCOUNTER — Ambulatory Visit (INDEPENDENT_AMBULATORY_CARE_PROVIDER_SITE_OTHER): Payer: BC Managed Care – PPO | Admitting: Orthopaedic Surgery

## 2019-02-27 ENCOUNTER — Encounter: Payer: Self-pay | Admitting: Orthopaedic Surgery

## 2019-02-27 DIAGNOSIS — Z96642 Presence of left artificial hip joint: Secondary | ICD-10-CM | POA: Diagnosis not present

## 2019-02-27 NOTE — Progress Notes (Signed)
Post-Op Visit Note   Patient: Wayne Jordan           Date of Birth: [birth year]           MRN: [id number] Visit Date: 02/27/2019 PCP: Elias Else, MD   Assessment & Plan:  Chief Complaint:  Chief Complaint  Patient presents with  . Left Hip - Pain   Visit Diagnoses:  1. Status post total hip replacement, left     Plan: Patient is a pleasant 59 year old gentleman who comes in today 6 weeks out left total hip replacement 01/15/2019.  Is been doing well.  He still has some stiffness and pain primarily with exercise.  He has been taking Robaxin as needed.  He has been outpatient physical therapy making slow but steady progress.  No fevers or chills.  Examination of left hip reveals a fully healed surgical scar without complication.  He is able to flex his hip but still lacks a fair amount of motion.  At this point, he will continue with outpatient physical therapy as he wishes as well as his home exercise program.  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  We will wait another 6 weeks for this.  Follow-up with [place] in 6 weeks time for repeat evaluation.  Call with concerns or questions.  Follow-Up Instructions: Return in about 6 weeks (around 04/10/2019).   Orders:  Orders Placed This Encounter  Procedures  . XR HIP UNILAT W OR W/O PELVIS 2-3 VIEWS LEFT   No orders of the defined types were placed in this encounter.   Imaging: XR HIP UNILAT W OR W/O PELVIS 2-3 VIEWS LEFT  Result Date: 02/27/2019 Well-seated prosthesis without complication   PMFS History: Patient Active Problem List   Diagnosis Date Noted  . Status post total replacement of left hip 01/15/2019  . Primary osteoarthritis of right hip 11/21/2018  . Primary osteoarthritis of left hip 09/20/2018  . Nondisplaced fracture of proximal phalanx of left great toe, initial encounter for closed fracture 08/01/2018  . Displaced fracture of proximal phalanx of left little finger with  routine healing 08/01/2018  . OSA on CPAP 05/26/2015  . Morbid obesity due to excess calories ([facility]) 05/26/2015  . CAD- LAD DES in setting of NSTEMI Sept 2014 04/06/2013  . Mixed hyperlipidemia 04/06/2013  . Old MI (myocardial infarction) 04/06/2013  . Encounter for long-term (current) use of other medications 04/06/2013  . Obesity, unspecified 10/08/2012   Past Medical History:  Diagnosis Date  . 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 Coronary artery disease    s/P PCI of the LAD with DES.  Nuclear stress test 04/2014 with no ischemia  . Hypercholesteremia   . Hyperlipidemia   . OSA on CPAP    followed by Dr. Jessie Foot  . Personal history of urinary calculi   . 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     Family History  Problem Relation Age of Onset  . Heart disease Father   . Heart attack Father   . CAD Father     Past Surgical History:  Procedure Laterality Date  . INGUINAL HERNIA REPAIR Right 03/08/2012   Procedure: HERNIA REPAIR INGUINAL ADULT;  Surgeon: Axel Filler, MD;  Location: [facility] OR;  Service: General;  Laterality: Right;  . INSERTION OF MESH N/A 03/08/2012   Procedure: INSERTION OF MESH;  Surgeon: Axel Filler, MD;  [facility]: [facility] OR;  Service: General;  Laterality: N/A;  .  LEFT HEART CATHETERIZATION WITH CORONARY ANGIOGRAM Right 10/07/2012   Procedure: LEFT HEART CATHETERIZATION WITH CORONARY ANGIOGRAM;  Surgeon: Jettie Booze, MD;  Location: [facility] CATH LAB;  Service: Cardiovascular;  Laterality: Right;  . METACARPAL OSTEOTOMY Left 11/01/2018   Procedure: Left small finger osteophyte removal;  Surgeon: Leandrew Koyanagi, MD;  Location: [place];  Service: Orthopedics;  Laterality: Left;  . PERCUTANEOUS STENT INTERVENTION  10/07/2012   Procedure: PERCUTANEOUS STENT INTERVENTION;  Surgeon: Jettie Booze, MD;  Location: [facility] CATH LAB;  Service: Cardiovascular;;  . TOTAL HIP ARTHROPLASTY Left 01/15/2019   Procedure: LEFT  TOTAL HIP ARTHROPLASTY ANTERIOR APPROACH;  Surgeon: Leandrew Koyanagi, MD;  Location: [place];  Service: Orthopedics;  Laterality: Left;   Social History   Occupational History  . Not on file  Tobacco Use  . Smoking status: Former Smoker    Packs/day: 1.50    Years: 7.00    Pack years: 10.50    Types: Cigarettes    Quit date: 10/01/2012    Years since quitting: 6.4  . Smokeless tobacco: Never Used  Substance and Sexual Activity  . Alcohol use: Not Currently    Comment: occassionally  . Drug use: No  . Sexual activity: Not on file

## 2019-03-03 ENCOUNTER — Other Ambulatory Visit: Payer: Self-pay | Admitting: Cardiology

## 2019-04-10 ENCOUNTER — Other Ambulatory Visit: Payer: Self-pay

## 2019-04-10 ENCOUNTER — Ambulatory Visit (INDEPENDENT_AMBULATORY_CARE_PROVIDER_SITE_OTHER): Payer: BC Managed Care – PPO | Admitting: Orthopaedic Surgery

## 2019-04-10 ENCOUNTER — Encounter: Payer: Self-pay | Admitting: Orthopaedic Surgery

## 2019-04-10 VITALS — Ht 71.0 in | Wt 278.0 lb

## 2019-04-10 DIAGNOSIS — Z96642 Presence of left artificial hip joint: Secondary | ICD-10-CM

## 2019-04-10 HISTORY — DX: Presence of left artificial hip joint: Z96.642

## 2019-04-10 NOTE — Progress Notes (Signed)
Post-Op Visit Note   Patient: Wayne Jordan           Date of Birth: 1960-05-26           MRN: [id number] Visit Date: 04/10/2019 PCP: Elias Else, MD   Assessment & Plan:  Chief Complaint:  Chief Complaint  Patient presents with  . Left Hip - Follow-up    Left THA DOS 01/15/2019   Visit Diagnoses:  1. Status post total hip replacement, left     Plan: [name] is 9-month status post left total hip replacement.  He is doing well overall.  He continues to feel weakness with hip flexion and with getting up from a seated position.  He is doing physical therapy currently.  He feels some weakness at times.  He is not taking any pain medications.  Surgical scar is fully healed.  Painless range of motion of the hip.  He does have some residual weakness with hip flexion and rising from a sit to stand.  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  Recheck in 3 months with standing AP pelvis and lateral hip x-rays.  Follow-Up Instructions: Return in about 3 months (around 07/11/2019).   Orders:  No orders of the defined types were placed in this encounter.  No orders of the defined types were placed in this encounter.   Imaging: No results found.  PMFS History: Patient Active Problem List   Diagnosis Date Noted  . Status post total hip replacement, left 04/10/2019  . Status post total replacement of left hip 01/15/2019  . Primary osteoarthritis of right hip 11/21/2018  . Primary osteoarthritis of left hip 09/20/2018  . Nondisplaced fracture of proximal phalanx of left great toe, initial encounter for closed fracture 08/01/2018  . Displaced fracture of proximal phalanx of left little finger with routine healing 08/01/2018  . OSA on CPAP 05/26/2015  . Morbid obesity due to excess calories ([facility]) 05/26/2015  . CAD- LAD DES  in setting of NSTEMI Sept 2014 04/06/2013  . Mixed hyperlipidemia 04/06/2013  . Old MI (myocardial infarction) 04/06/2013  . Encounter for long-term (current) use of other medications 04/06/2013  . Obesity, unspecified 10/08/2012   Past Medical History:  Diagnosis Date  . 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 Coronary artery disease    s/P PCI of the LAD with DES.  Nuclear stress test 04/2014 with no ischemia  . Hypercholesteremia   . Hyperlipidemia   . OSA on CPAP    followed by Dr. Jessie Foot  . Personal history of urinary calculi   . 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     Family History  Problem Relation Age of Onset  . Heart disease Father   . Heart attack Father   . CAD Father     Past Surgical History:  Procedure Laterality Date  . INGUINAL HERNIA REPAIR Right 03/08/2012   Procedure: HERNIA REPAIR INGUINAL ADULT;  Surgeon: Axel Filler, MD;  Location: [facility] OR;  Service: General;  Laterality: Right;  . INSERTION OF MESH N/A 03/08/2012   Procedure: INSERTION OF MESH;  Surgeon: Axel Filler, MD;  Location: [facility] OR;  Service: General;  Laterality: N/A;  . LEFT HEART CATHETERIZATION WITH CORONARY ANGIOGRAM Right 10/07/2012   Procedure: LEFT HEART CATHETERIZATION WITH CORONARY ANGIOGRAM;  Surgeon: Renelda Loma  Hassell Done, MD;  Location: [facility] CATH LAB;  Service: Cardiovascular;  Laterality: Right;  . METACARPAL OSTEOTOMY Left 11/01/2018   Procedure: Left small finger osteophyte removal;  Surgeon: Leandrew Koyanagi, MD;  Location: [place];  Service: Orthopedics;  Laterality: Left;  . PERCUTANEOUS STENT INTERVENTION  10/07/2012   Procedure: PERCUTANEOUS STENT INTERVENTION;  Surgeon: Jettie Booze, MD;  Location: [facility] CATH LAB;  Service: Cardiovascular;;  . TOTAL HIP ARTHROPLASTY Left 01/15/2019   Procedure: LEFT TOTAL HIP ARTHROPLASTY ANTERIOR APPROACH;  Surgeon: Leandrew Koyanagi, MD;  Location: [place];  Service: Orthopedics;  Laterality: Left;     Social History   Occupational History  . Not on file  Tobacco Use  . Smoking status: Former Smoker    Packs/day: 1.50    Years: 7.00    Pack years: 10.50    Types: Cigarettes    Quit date: 10/01/2012    Years since quitting: 6.5  . Smokeless tobacco: Never Used  Substance and Sexual Activity  . Alcohol use: Not Currently    Comment: occassionally  . Drug use: No  . Sexual activity: Not on file

## 2019-05-28 ENCOUNTER — Telehealth: Payer: Self-pay | Admitting: Orthopaedic Surgery

## 2019-05-28 NOTE — Telephone Encounter (Signed)
May return now.  No restrictions if he doesn't feel that he needs any.

## 2019-05-28 NOTE — Telephone Encounter (Signed)
Patient called stating that the letter that was written for him to return to work needs to specify if he has any restrictions or not.  IQ#[phone].  Thank you.

## 2019-05-28 NOTE — Telephone Encounter (Signed)
Any restrictions? If yes, what are they and for how long?

## 2019-05-29 NOTE — Telephone Encounter (Signed)
Called patient to let him know this. No answer LMOM.

## 2019-05-29 NOTE — Telephone Encounter (Signed)
Patient called back stating that he does not feel like he needs any restrictions.  He needs the note to state, "Can return to work with no restrictions."  His employer will not take "is clear".  HE#[phone].  Thank you.

## 2019-05-30 ENCOUNTER — Other Ambulatory Visit: Payer: Self-pay

## 2019-05-30 ENCOUNTER — Ambulatory Visit (INDEPENDENT_AMBULATORY_CARE_PROVIDER_SITE_OTHER): Payer: BC Managed Care – PPO | Admitting: Cardiology

## 2019-05-30 ENCOUNTER — Encounter: Payer: Self-pay | Admitting: Cardiology

## 2019-05-30 VITALS — BP 124/80 | HR 98 | Ht 71.0 in | Wt 286.0 lb

## 2019-05-30 DIAGNOSIS — E782 Mixed hyperlipidemia: Secondary | ICD-10-CM

## 2019-05-30 DIAGNOSIS — I2510 Atherosclerotic heart disease of native coronary artery without angina pectoris: Secondary | ICD-10-CM

## 2019-05-30 DIAGNOSIS — I252 Old myocardial infarction: Secondary | ICD-10-CM

## 2019-05-30 NOTE — Telephone Encounter (Signed)
Note made. Patient aware he can come pick up at our office.

## 2019-05-30 NOTE — Patient Instructions (Signed)

## 2019-05-30 NOTE — Progress Notes (Signed)
Cardiology Office Note:    Date:  05/30/2019   ID:  [name], DOB [birth year], MRN [id number]  PCP:  Maury Dus, MD  Cardiologist:  Jenne Campus, MD    Referring MD: Maury Dus, MD   Chief Complaint  Patient presents with  . Follow-up  We will  History of Present Illness:    Wayne Jordan is a 59 y.o. male with past medical history significant for coronary artery disease, status post PTCA and stent to LAD done in September 2014.  Recently he was evaluated with stress testing before hip replacement surgery.  Stress test showed no evidence of ischemia.  He went to surgery without any difficulties and doing well.  He is going to rehab on the regular basis and doing well.  Denies have any chest pain tightness squeezing pressure burning chest.  Past Medical History:  Diagnosis Date  . 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 Coronary artery disease    s/P PCI of the LAD with DES.  Nuclear stress test 04/2014 with no ischemia  . Hypercholesteremia   . Hyperlipidemia   . OSA on CPAP    followed by Dr. Andrew Au  . Personal history of urinary calculi   . 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     Past Surgical History:  Procedure Laterality Date  . INGUINAL HERNIA REPAIR Right 03/08/2012   Procedure: HERNIA REPAIR INGUINAL ADULT;  Surgeon: Ralene Ok, MD;  Location: [place];  Service: General;  Laterality: Right;  . INSERTION OF MESH N/A 03/08/2012   Procedure: INSERTION OF MESH;  Surgeon: Ralene Ok, MD;  Location: [place];  Service: General;  Laterality: N/A;  . LEFT HEART CATHETERIZATION WITH CORONARY ANGIOGRAM Right 10/07/2012   Procedure: LEFT HEART CATHETERIZATION WITH CORONARY ANGIOGRAM;  Surgeon: Jettie Booze, MD;  Location: [facility] CATH LAB;  Service: Cardiovascular;  Laterality: Right;  . METACARPAL OSTEOTOMY Left 11/01/2018   Procedure: Left small finger osteophyte removal;  Surgeon: Leandrew Koyanagi, MD;  Location: [place];  Service: Orthopedics;  Laterality: Left;  . PERCUTANEOUS STENT INTERVENTION  10/07/2012   Procedure: PERCUTANEOUS STENT INTERVENTION;  Surgeon: Jettie Booze, MD;  Location[facility] CATH LAB;  Service: Cardiovascular;;  . TOTAL HIP ARTHROPLASTY Left 01/15/2019   Procedure: LEFT TOTAL HIP ARTHROPLASTY ANTERIOR APPROACH;  Surgeon: Leandrew Koyanagi, MD;  Location: [place];  Service: Orthopedics;  Laterality: Left;    Current Medications: Current Meds  Medication Sig  . ascorbic acid (VITAMIN C) 500 MG tablet Take 500-1,000 mg by mouth daily.   Marland Kitchen atorvastatin (LIPITOR) 80 MG tablet Take 1 tablet (80 mg total) by mouth daily as needed.  . clopidogrel (PLAVIX) 75 MG tablet Take 1 tablet (75 mg total) by mouth daily.  . hydroxychloroquine (PLAQUENIL) 200 MG tablet   . metoprolol tartrate (LOPRESSOR) 25 MG tablet Take 1 tablet (25 mg total) by mouth 2 (two) times daily.  . Multiple Vitamins-Minerals (MULTIVITAMIN WITH MINERALS) tablet Take 1 tablet by mouth every evening.  . nitroGLYCERIN (NITROSTAT) 0.4 MG SL tablet Place 1 tablet (0.4 mg total) under the tongue every 5 (five) minutes x 3 doses as needed for chest pain.     Allergies:   Coconut fatty acids and Penicillins   Social History   Socioeconomic History  . Marital status: Married    Spouse name: Not on file  . Number of children: Not on file  . Years of education: Not on  file  . Highest education level: Not on file  Occupational History  . Not on file  Tobacco Use  . Smoking status: Former Smoker    Packs/day: 1.50    Years: 7.00    Pack years: 10.50    Types: Cigarettes    Quit date: 10/01/2012    Years since quitting: 6.6  . Smokeless tobacco: Never Used  Substance and Sexual Activity  . Alcohol use: Not Currently    Comment: occassionally  . Drug use: No  . Sexual activity: Not on file  Other Topics Concern  . Not on file  Social History Narrative   Drinks 2 cheerwines daily.   Social Determinants of  Health   Financial Resource Strain:   . Difficulty of Paying Living Expenses:   Food Insecurity:   . Worried About Programme researcher, broadcasting/film/video in the Last Year:   . Barista in the Last Year:   Transportation Needs:   . Freight forwarder (Medical):   Marland Kitchen Lack of Transportation (Non-Medical):   Physical Activity:   . Days of Exercise per Week:   . Minutes of Exercise per Session:   Stress:   . Feeling of Stress :   Social Connections:   . Frequency of Communication with Friends and Family:   . Frequency of Social Gatherings with Friends and Family:   . Attends Religious Services:   . Active Member of Clubs or Organizations:   . Attends Banker Meetings:   Marland Kitchen Marital Status:      Family History: The patient's family history includes CAD in his father; Heart attack in his father; Heart disease in his father. ROS:   Please see the history of present illness.    All 14 point review of systems negative except as described per history of present illness  EKGs/Labs/Other Studies Reviewed:      Recent Labs: 01/08/2019: ALT 48; BUN 11; Creatinine, Ser 1.16; Hemoglobin 15.8; Platelets 206; Potassium 4.4; Sodium 141  Recent Lipid Panel    Component Value Date/Time   CHOL 111 07/13/2018 1432   CHOL CANCELED 11/07/2014 0921   CHOL SEE BELOW 11/07/2014 0921   CHOL 128 04/26/2014 0757   TRIG 188 (H) 07/13/2018 1432   TRIG CANCELED 11/07/2014 0921   TRIG SEE BELOW 11/07/2014 0921   TRIG 140 04/26/2014 0757   HDL 37 (L) 07/13/2018 1432   HDL CANCELED 11/07/2014 0921   HDL SEE BELOW 11/07/2014 0921   HDL 39 (L) 04/26/2014 0757   CHOLHDL 3.0 07/13/2018 1432   CHOLHDL 2.6 04/18/2015 0930   VLDL 22 04/18/2015 0930   LDLCALC 36 07/13/2018 1432   LDLCALC CANCELED 11/07/2014 0921   LDLCALC SEE BELOW 11/07/2014 0921   LDLCALC 61 04/26/2014 0757    Physical Exam:    VS:  BP 124/80   Pulse 98   Ht 5\' 11"  (1.803 m)   Wt 286 lb (129.7 kg)   SpO2 96%   BMI 39.89  kg/m     Wt Readings from Last 3 Encounters:  05/30/19 286 lb (129.7 kg)  04/10/19 278 lb (126.1 kg)  01/15/19 278 lb (126.1 kg)     GEN:  Well nourished, well developed in no acute distress HEENT: Normal NECK: No JVD; No carotid bruits LYMPHATICS: No lymphadenopathy CARDIAC: RRR, no murmurs, no rubs, no gallops RESPIRATORY:  Clear to auscultation without rales, wheezing or rhonchi  ABDOMEN: Soft, non-tender, non-distended MUSCULOSKELETAL:  No edema; No deformity  SKIN: Warm and  dry LOWER EXTREMITIES: no swelling NEUROLOGIC:  Alert and oriented x 3 PSYCHIATRIC:  Normal affect   ASSESSMENT:    1. Coronary artery disease involving native coronary artery of native heart without angina pectoris   2. Old MI (myocardial infarction)   3. Mixed hyperlipidemia   4. Morbid obesity due to excess calories (HCC)    PLAN:    In order of problems listed above:  1. Coronary disease stable last stress test reviewed from October showing no evidence of ischemia.  We will continue present management. 2. Evidence of old myocardial infarction.  Preserved ejection fraction. 3. Mixed dyslipidemia last cholesterol was excellent.  We will schedule him to 100 fasting lipid profile done. 4. Obesity.  He goes to gym on a regular basis however he mostly concentrated on flexibility as well as weightlifting.  I told him he need to add some cardiac work-up to this.  He will try to ride bike.   Medication Adjustments/Labs and Tests Ordered: Current medicines are reviewed at length with the patient today.  Concerns regarding medicines are outlined above.  No orders of the defined types were placed in this encounter.  Medication changes: No orders of the defined types were placed in this encounter.   Signed, Georgeanna Lea, MD, [facility] 05/30/2019 4:53 PM    [facility]

## 2019-06-01 ENCOUNTER — Other Ambulatory Visit: Payer: Self-pay | Admitting: Cardiology

## 2019-07-09 ENCOUNTER — Ambulatory Visit: Payer: BC Managed Care – PPO | Admitting: Adult Health

## 2019-08-15 ENCOUNTER — Encounter: Payer: Self-pay | Admitting: Adult Health

## 2019-08-20 ENCOUNTER — Encounter: Payer: Self-pay | Admitting: Adult Health

## 2019-08-20 ENCOUNTER — Ambulatory Visit: Payer: BC Managed Care – PPO | Admitting: Adult Health

## 2019-08-20 VITALS — BP 122/78 | HR 72 | Ht 71.0 in | Wt 291.2 lb

## 2019-08-20 DIAGNOSIS — G4733 Obstructive sleep apnea (adult) (pediatric): Secondary | ICD-10-CM

## 2019-08-20 DIAGNOSIS — Z9989 Dependence on other enabling machines and devices: Secondary | ICD-10-CM | POA: Diagnosis not present

## 2019-08-20 NOTE — Progress Notes (Signed)
PATIENT: Wayne Jordan DOB: [birth year]  REASON FOR VISIT: follow up HISTORY FROM: patient  HISTORY OF PRESENT ILLNESS: Today 08/20/19:  Mr. Ryner is a 59 year old male with a history of obstructive sleep apnea on CPAP.  His download indicates that he uses machine nightly for compliance of 100%.  He uses machine greater than 4 hours each night.  His residual AHI is 1.9 on 13 cm of water.  Reports that the CPAP is working well for him.  Reports that he has not changed out his supplies since last year.  HISTORY The patient states that download indicates that he used his machine 27 out of 30 days for compliance of 90%.  He uses machine greater than 4 hours each night.  On average he uses his machine 7 hours and 37 minutes.  His residual AHI is 2.2 on 13 cm of water.  He states occasionally he will feel the mask leaking however he had a mask refitting and does like his new mask.  He denies any new issues.  REVIEW OF SYSTEMS: Out of a complete 14 system review of symptoms, the patient complains only of the following symptoms, and all other reviewed systems are negative.  FSS 15 ESS 3  ALLERGIES: Allergies  Allergen Reactions  . Coconut Fatty Acids Shortness Of Breath and Swelling  . Penicillins Hives and Rash    Did it involve swelling of the face/tongue/throat, SOB, or low BP? Yes Did it involve sudden or severe rash/hives, skin peeling, or any reaction on the inside of your mouth or nose? No Did you need to seek medical attention at a hospital or doctor's office? No When did it last happen?unknown  If all above answers are "NO", may proceed with cephalosporin use.     HOME MEDICATIONS: Outpatient Medications Prior to Visit  Medication Sig Dispense Refill  . ascorbic acid (VITAMIN C) 500 MG tablet Take 500-1,000 mg by mouth daily.     Marland Kitchen atorvastatin (LIPITOR) 80 MG tablet TAKE 1 TABLET DAILY AS     NEEDED 90 tablet 3  . clopidogrel (PLAVIX) 75 MG tablet TAKE 1 TABLET DAILY  90 tablet 3  . hydroxychloroquine (PLAQUENIL) 200 MG tablet     . metoprolol tartrate (LOPRESSOR) 25 MG tablet TAKE 1 TABLET TWICE A DAY 180 tablet 3  . Multiple Vitamins-Minerals (MULTIVITAMIN WITH MINERALS) tablet Take 1 tablet by mouth every evening.    . nitroGLYCERIN (NITROSTAT) 0.4 MG SL tablet Place 1 tablet (0.4 mg total) under the tongue every 5 (five) minutes x 3 doses as needed for chest pain. 25 tablet 3   No facility-administered medications prior to visit.    PAST MEDICAL HISTORY: Past Medical History:  Diagnosis Date  . 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 Coronary artery disease    s/P PCI of the LAD with DES.  Nuclear stress test 04/2014 with no ischemia  . Hypercholesteremia   . Hyperlipidemia   . OSA on CPAP    followed by Dr. Jessie Foot  . Personal history of urinary calculi   . 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     PAST SURGICAL HISTORY: Past Surgical History:  Procedure Laterality Date  . INGUINAL HERNIA REPAIR Right 03/08/2012   Procedure: HERNIA REPAIR INGUINAL ADULT;  Surgeon: Axel Filler, MD;  Location: [facility] OR;  Service: General;  Laterality: Right;  . INSERTION OF MESH N/A 03/08/2012   Procedure: INSERTION OF MESH;  Surgeon: Axel Filler, MD;  Location: [facility] OR;  Service: General;  Laterality: N/A;  . LEFT HEART CATHETERIZATION WITH CORONARY ANGIOGRAM Right 10/07/2012   Procedure: LEFT HEART CATHETERIZATION WITH CORONARY ANGIOGRAM;  Surgeon: Corky Crafts, MD;  Location: [facility] CATH LAB;  Service: Cardiovascular;  Laterality: Right;  . METACARPAL OSTEOTOMY Left 11/01/2018   Procedure: Left small finger osteophyte removal;  Surgeon: Tarry Kos, MD;  Location: [facility];  Service: Orthopedics;  Laterality: Left;  . PERCUTANEOUS STENT INTERVENTION  10/07/2012   Procedure: PERCUTANEOUS STENT INTERVENTION;  Surgeon: Corky Crafts, MD;  Location: [facility] C[facility] LAB;  Service: Cardiovascular;;  . TOTAL HIP  ARTHROPLASTY Left 01/15/2019   Procedure: LEFT TOTAL HIP ARTHROPLASTY ANTERIOR APPROACH;  Surgeon: Tarry Kos, MD;  Location: [facility] OR;  Service: Orthopedics;  Laterality: Left;    FAMILY HISTORY: Family History  Problem Relation Age of Onset  . Heart disease Father   . Heart attack Father   . CAD Father     SOCIAL HISTORY: Social History   Socioeconomic History  . Marital status: Married    Spouse name: Not on file  . Number of children: Not on file  . Years of education: Not on file  . Highest education level: Not on file  Occupational History  . Not on file  Tobacco Use  . Smoking status: Former Smoker    Packs/day: 1.50    Years: 7.00    Pack years: 10.50    Types: Cigarettes    Quit date: 10/01/2012    Years since quitting: 6.8  . Smokeless tobacco: Never Used  Vaping Use  . Vaping Use: Never used  Substance and Sexual Activity  . Alcohol use: Not Currently    Comment: occassionally  . Drug use: No  . Sexual activity: Not on file  Other Topics Concern  . Not on file  Social History Narrative   Drinks 2 cheerwines daily.   Social Determinants of Health   Financial Resource Strain:   . Difficulty of Paying Living Expenses:   Food Insecurity:   . Worried About Programme researcher, broadcasting/film/video in the Last Year:   . Barista in the Last Year:   Transportation Needs:   . Freight forwarder (Medical):   Marland Kitchen Lack of Transportation (Non-Medical):   Physical Activity:   . Days of Exercise per Week:   . Minutes of Exercise per Session:   Stress:   . Feeling of Stress :   Social Connections:   . Frequency of Communication with Friends and Family:   . Frequency of Social Gatherings with Friends and Family:   . Attends Religious Services:   . Active Member of Clubs or Organizations:   . Attends Banker Meetings:   Marland Kitchen Marital Status:   Intimate Partner Violence:   . Fear of Current or Ex-Partner:   . Emotionally Abused:   Marland Kitchen Physically Abused:   .  Sexually Abused:       PHYSICAL EXAM  Vitals:   08/20/19 0844  BP: 122/78  Pulse: 72  Weight: (!) 291 lb 3.2 oz (132.1 kg)  Height: 5\' 11"  (1.803 m)   Body mass index is 40.61 kg/m.  Generalized: Well developed, in no acute distress  Chest: Lungs clear to auscultation bilaterally  Neurological examination  Mentation: Alert oriented to time, place, history taking. Follows all commands speech and language fluent Cranial nerve II-XII: Extraocular movements were full, visual field were full on confrontational test Head turning and shoulder  shrug  were normal and symmetric. Motor: The motor testing reveals 5 over 5 strength of all 4 extremities. Good symmetric motor tone is noted throughout.  Sensory: Sensory testing is intact to soft touch on all 4 extremities. No evidence of extinction is noted.  Gait and station: Gait is normal.    DIAGNOSTIC DATA (LABS, IMAGING, TESTING) - I reviewed patient records, labs, notes, testing and imaging myself where available.  Lab Results  Component Value Date   WBC 6.5 01/08/2019   HGB 15.8 01/08/2019   HCT 48.4 01/08/2019   MCV 94.5 01/08/2019   PLT 206 01/08/2019      Component Value Date/Time   NA 141 01/08/2019 1145   K 4.4 01/08/2019 1145   CL 108 01/08/2019 1145   CO2 26 01/08/2019 1145   GLUCOSE 100 (H) 01/08/2019 1145   BUN 11 01/08/2019 1145   CREATININE 1.16 01/08/2019 1145   CALCIUM 9.6 01/08/2019 1145   PROT 7.1 01/08/2019 1145   PROT 6.6 02/21/2017 0934   ALBUMIN 4.0 01/08/2019 1145   ALBUMIN 4.3 02/21/2017 0934   AST 36 01/08/2019 1145   ALT 48 (H) 01/08/2019 1145   ALKPHOS 116 01/08/2019 1145   BILITOT 1.0 01/08/2019 1145   BILITOT 0.6 02/21/2017 0934   GFRNONAA >60 01/08/2019 1145   GFRAA >60 01/08/2019 1145   Lab Results  Component Value Date   CHOL 111 07/13/2018   HDL 37 (L) 07/13/2018   LDLCALC 36 07/13/2018   TRIG 188 (H) 07/13/2018   CHOLHDL 3.0 07/13/2018   Lab Results  Component Value Date    HGBA1C 5.6 10/07/2012   No results found for: VITAMINB12 No results found for: TSH    ASSESSMENT AND PLAN 59 y.o. year old male  has a past medical history of Acute myocardial infarction of other anterior wall, initial episode of care, Coronary artery disease, Hypercholesteremia, Hyperlipidemia, OSA on CPAP, Personal history of urinary calculi, and 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. here with:  1. OSA on CPAP  - CPAP compliance excellent - Good treatment of AHI  - Encourage patient to use CPAP nightly and > 4 hours each night - F/U in 1 year or sooner if needed   I spent 20 minutes of face-to-face and non-face-to-face time with patient.  This included previsit chart review, lab review, study review, order entry, electronic health record documentation, patient education.  Butch Penny, MSN, NP-C 08/20/2019, 9:10 AM [facility] [address], [address] [place], [place] [address] [phone]

## 2019-08-20 NOTE — Patient Instructions (Signed)
Continue using CPAP nightly and greater than 4 hours each night °If your symptoms worsen or you develop new symptoms please let us know.  ° °

## 2019-08-27 ENCOUNTER — Telehealth: Payer: Self-pay

## 2019-08-27 NOTE — Telephone Encounter (Signed)
Disability form received from [employer] on behalf of [employer]. Sent to [employer].

## 2019-10-22 ENCOUNTER — Ambulatory Visit
Admission: EM | Admit: 2019-10-22 | Discharge: 2019-10-22 | Disposition: A | Discharge status: Home / Self Care | Payer: BC Managed Care – PPO

## 2019-10-22 ENCOUNTER — Other Ambulatory Visit: Payer: Self-pay

## 2019-10-22 DIAGNOSIS — S86911A Strain of unspecified muscle(s) and tendon(s) at lower leg level, right leg, initial encounter: Secondary | ICD-10-CM

## 2019-10-22 NOTE — Discharge Instructions (Addendum)
Heat therapy (hot compress, warm wash rag, hot showers, etc.) can help relax muscles and soothe muscle aches. Cold therapy (ice packs) can be used to help swelling both after injury and after prolonged use of areas of chronic pain/aches.  Pain medication:  350 mg-1000 mg of Tylenol (acetaminophen) and/or 200 mg - 800 mg of Advil (ibuprofen, Motrin) every 8 hours as needed.  May alternate between the two throughout the day as they are generally safe to take together.  DO NOT exceed more than 3000 mg of Tylenol or 3200 mg of ibuprofen in a 24 hour period as this could damage your stomach, kidneys, liver, or increase your bleeding risk.  Important to follow up with specialist(s) below for further evaluation/management if your symptoms persist or worsen. 

## 2019-10-22 NOTE — ED Triage Notes (Signed)
Pt checked in for leg pain. PT is aox4 and ambulatory.

## 2019-10-22 NOTE — ED Provider Notes (Signed)
[facility]    CSN: [id number] Arrival date & time: 10/22/19  1828      History   Chief Complaint Chief Complaint  Patient presents with  . Leg Pain    HPI Wayne Jordan is a 59 y.o. male  Presenting for right calf pain since this morning.  States it hurt a little bit a few days before.  Denies fall, swelling, numbness or deformity.  States that he has been working and been going up and down both stairs.  States that when he pushed off he felt a pulling sensation.  Denies spasm, history of blood clot.  Has not taken thing for it.  Past Medical History:  Diagnosis Date  . 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 Coronary artery disease    s/P PCI of the LAD with DES.  Nuclear stress test 04/2014 with no ischemia  . Hypercholesteremia   . Hyperlipidemia   . OSA on CPAP    followed by Dr. Jessie Foot  . Personal history of urinary calculi   . 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     Patient Active Problem List   Diagnosis Date Noted  . Status post total hip replacement, left 04/10/2019  . Status post total replacement of left hip 01/15/2019  . Primary osteoarthritis of right hip 11/21/2018  . Primary osteoarthritis of left hip 09/20/2018  . Nondisplaced fracture of proximal phalanx of left great toe, initial encounter for closed fracture 08/01/2018  . Displaced fracture of proximal phalanx of left little finger with routine healing 08/01/2018  . OSA on CPAP 05/26/2015  . Morbid obesity due to excess calories (HCC) 05/26/2015  . CAD- LAD DES in setting of NSTEMI Sept 2014 04/06/2013  . Mixed hyperlipidemia 04/06/2013  . Old MI (myocardial infarction) 04/06/2013  . Encounter for long-term (current) use of other medications 04/06/2013  . Obesity, unspecified 10/08/2012    Past Surgical History:  Procedure Laterality Date  . INGUINAL HERNIA REPAIR Right 03/08/2012   Procedure: HERNIA REPAIR INGUINAL ADULT;  Surgeon: Axel Filler, MD;  Location: [facility] OR;  Service: General;  Laterality: Right;  . INSERTION OF MESH N/A 03/08/2012   Procedure: INSERTION OF MESH;  Surgeon: Axel Filler, MD;  Location: [facility] OR;  Service: General;  Laterality: N/A;  . LEFT HEART CATHETERIZATION WITH CORONARY ANGIOGRAM Right 10/07/2012   Procedure: LEFT HEART CATHETERIZATION WITH CORONARY ANGIOGRAM;  Surgeon: Corky Crafts, MD;  Location: [facility] CATH LAB;  Service: Cardiovascular;  Laterality: Right;  . METACARPAL OSTEOTOMY Left 11/01/2018   Procedure: Left small finger osteophyte removal;  Surgeon: Tarry Kos, MD;  Location: [facility];  Service: Orthopedics;  Laterality: Left;  . PERCUTANEOUS STENT INTERVENTION  10/07/2012   Procedure: PERCUTANEOUS STENT INTERVENTION;  Surgeon: Corky Crafts, MD;  Location: [facility] CATH LAB;  Service: Cardiovascular;;  . TOTAL HIP ARTHROPLASTY Left 01/15/2019   Procedure: LEFT TOTAL HIP ARTHROPLASTY ANTERIOR APPROACH;  Surgeon: Tarry Kos, MD;  Location: [facility] OR;  Service: Orthopedics;  Laterality: Left;       Home Medications    Prior to Admission medications   Medication Sig Start Date End Date Taking? Authorizing Provider  ascorbic acid (VITAMIN C) 500 MG tablet Take 500-1,000 mg by mouth daily.     [provider]  atorvastatin (LIPITOR) 80 MG tablet TAKE 1 TABLET DAILY AS     NEEDED 06/01/19   Georgeanna Lea, MD  clopidogrel (PLAVIX) 75 MG  tablet TAKE 1 TABLET DAILY 06/01/19   Georgeanna Lea, MD  hydroxychloroquine (PLAQUENIL) 200 MG tablet  04/02/19   [provider]  metoprolol tartrate (LOPRESSOR) 25 MG tablet TAKE 1 TABLET TWICE A DAY 06/01/19   Georgeanna Lea, MD  Multiple Vitamins-Minerals (MULTIVITAMIN WITH MINERALS) tablet Take 1 tablet by mouth every evening.    [provider]  nitroGLYCERIN (NITROSTAT) 0.4 MG SL tablet Place 1 tablet (0.4 mg total) under the tongue every 5 (five) minutes x 3 doses as needed for chest pain.  02/16/16   Quintella Reichert, MD    Family History Family History  Problem Relation Age of Onset  . Heart disease Father   . Heart attack Father   . CAD Father     Social History Social History   Tobacco Use  . Smoking status: Former Smoker    Packs/day: 1.50    Years: 7.00    Pack years: 10.50    Types: Cigarettes    Quit date: 10/01/2012    Years since quitting: 7.0  . Smokeless tobacco: Never Used  Vaping Use  . Vaping Use: Never used  Substance Use Topics  . Alcohol use: Not Currently    Comment: occassionally  . Drug use: No     Allergies   Coconut fatty acids and Penicillins   Review of Systems As per HPI   Physical Exam Triage Vital Signs ED Triage Vitals  Enc Vitals Group     BP      Pulse      Resp      Temp      Temp src      SpO2      Weight      Height      Head Circumference      Peak Flow      Pain Score      Pain Loc      Pain Edu?      Excl. in GC?    No data found.  Updated Vital Signs BP 129/85 (BP Location: Left Arm)   Pulse 90   Temp 98.4 F (36.9 C) (Oral)   Resp 18   SpO2 99%   Visual Acuity Right Eye Distance:   Left Eye Distance:   Bilateral Distance:    Right Eye Near:   Left Eye Near:    Bilateral Near:     Physical Exam Constitutional:      General: He is not in acute distress. HENT:     Head: Normocephalic and atraumatic.  Eyes:     General: No scleral icterus.    Pupils: Pupils are equal, round, and reactive to light.  Cardiovascular:     Rate and Rhythm: Normal rate.  Pulmonary:     Effort: Pulmonary effort is normal. No respiratory distress.     Breath sounds: No wheezing.  Musculoskeletal:        General: Tenderness present. No swelling. Normal range of motion.     Right lower leg: No edema.     Left lower leg: No edema.     Comments: Right calf tenderness compared to left.  Negative Homans' sign, negative for cords.  Calves are symmetric bilaterally  Skin:    Capillary Refill: Capillary  refill takes less than 2 seconds.     Coloration: Skin is not jaundiced or pale.     Findings: No bruising or rash.  Neurological:     General: No focal deficit present.  Mental Status: He is alert and oriented to person, place, and time.      UC Treatments / Results  Labs (all labs ordered are listed, but only abnormal results are displayed) Labs Reviewed - No data to display  EKG   Radiology No results found.  Procedures Procedures (including critical care time)  Medications Ordered in UC Medications - No data to display  Initial Impression / Assessment and Plan / UC Course  I have reviewed the triage vital signs and the nursing notes.  Pertinent labs & imaging results that were available during my care of the patient were reviewed by me and considered in my medical decision making (see chart for details).     Low concern for DVT at this time, though discussed return precautions thereof.  Reports compliance with Plavix.  Likely MSK: We will treat supportively as outlined below.  Return precautions discussed, pt verbalized understanding and is agreeable to plan. Final Clinical Impressions(s) / UC Diagnoses   Final diagnoses:  Muscle strain of right lower leg, initial encounter     Discharge Instructions     Heat therapy (hot compress, warm wash rag, hot showers, etc.) can help relax muscles and soothe muscle aches. Cold therapy (ice packs) can be used to help swelling both after injury and after prolonged use of areas of chronic pain/aches.  Pain medication:  350 mg-1000 mg of Tylenol (acetaminophen) and/or 200 mg - 800 mg of Advil (ibuprofen, Motrin) every 8 hours as needed.  May alternate between the two throughout the day as they are generally safe to take together.  DO NOT exceed more than 3000 mg of Tylenol or 3200 mg of ibuprofen in a 24 hour period as this could damage your stomach, kidneys, liver, or increase your bleeding risk.   Important to follow up  with specialist(s) below for further evaluation/management if your symptoms persist or worsen.    ED Prescriptions    None     PDMP not reviewed this encounter.   Odette Fraction [place], [place] 10/22/19 1947

## 2019-10-22 NOTE — ED Triage Notes (Signed)
PT states pain is very positional and is worse when walking. Right leg affected. Pt is aox4 and ambulates with a slight limp.

## 2019-12-04 DIAGNOSIS — E7800 Pure hypercholesterolemia, unspecified: Secondary | ICD-10-CM | POA: Insufficient documentation

## 2019-12-04 DIAGNOSIS — I2510 Atherosclerotic heart disease of native coronary artery without angina pectoris: Secondary | ICD-10-CM | POA: Insufficient documentation

## 2019-12-04 DIAGNOSIS — Z87442 Personal history of urinary calculi: Secondary | ICD-10-CM | POA: Insufficient documentation

## 2019-12-04 DIAGNOSIS — N209 Urinary calculus, unspecified: Secondary | ICD-10-CM | POA: Insufficient documentation

## 2019-12-04 DIAGNOSIS — E785 Hyperlipidemia, unspecified: Secondary | ICD-10-CM | POA: Insufficient documentation

## 2019-12-05 ENCOUNTER — Ambulatory Visit (INDEPENDENT_AMBULATORY_CARE_PROVIDER_SITE_OTHER): Payer: BC Managed Care – PPO | Admitting: Cardiology

## 2019-12-05 ENCOUNTER — Encounter: Payer: Self-pay | Admitting: Cardiology

## 2019-12-05 ENCOUNTER — Other Ambulatory Visit: Payer: Self-pay

## 2019-12-05 VITALS — BP 130/72 | HR 86 | Ht 71.0 in | Wt 279.0 lb

## 2019-12-05 DIAGNOSIS — Z96642 Presence of left artificial hip joint: Secondary | ICD-10-CM | POA: Diagnosis not present

## 2019-12-05 DIAGNOSIS — E782 Mixed hyperlipidemia: Secondary | ICD-10-CM | POA: Diagnosis not present

## 2019-12-05 DIAGNOSIS — I2510 Atherosclerotic heart disease of native coronary artery without angina pectoris: Secondary | ICD-10-CM | POA: Diagnosis not present

## 2019-12-05 DIAGNOSIS — Z9989 Dependence on other enabling machines and devices: Secondary | ICD-10-CM

## 2019-12-05 DIAGNOSIS — G4733 Obstructive sleep apnea (adult) (pediatric): Secondary | ICD-10-CM | POA: Diagnosis not present

## 2019-12-05 NOTE — Patient Instructions (Signed)

## 2019-12-05 NOTE — Progress Notes (Signed)
Cardiology Office Note:    Date:  12/05/2019   ID:  [name], DOB May 19, 1960, MRN [id number]  PCP:  Elias Else, MD  Cardiologist:  Gypsy Balsam, MD    Referring MD: Elias Else, MD   Chief Complaint  Patient presents with  . Follow-up  And 30 fine  History of Present Illness:    Wayne Jordan is a 59 y.o. male with past medical history significant for coronary artery disease, status post PTCA and drug-eluting stent to LAD done in September 2014, he did have a stress test done last year before evaluation for hip surgery which was negative, does have obstructive sleep apnea, obesity, dyslipidemia.  Comes today 2 months for follow-up.  Overall doing well but he admits that he does not do much exercises now.  There was a time that he exercise quite aggressively but now because he is back at work and a schedule change he does not do much.  Described to have some twinges in his chest lasting only for split-second not related to exercise.  Past Medical History:  Diagnosis Date  . 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 CAD- LAD DES in setting of NSTEMI Sept 2014 04/06/2013  . Coronary artery disease    s/P PCI of the LAD with DES.  Nuclear stress test 04/2014 with no ischemia  . Displaced fracture of proximal phalanx of left little finger with routine healing 08/01/2018  . Encounter for long-term (current) use of other medications 04/06/2013  . Hypercholesteremia   . Hyperlipidemia   . Mixed hyperlipidemia 04/06/2013  . Morbid obesity due to excess calories (HCC) 05/26/2015  . Nondisplaced fracture of proximal phalanx of left great toe, initial encounter for closed fracture 08/01/2018  . Obesity, unspecified 10/08/2012  . Old MI (myocardial infarction) 04/06/2013  . OSA on CPAP    followed by Dr. Jessie Foot  . Personal history of urinary calculi   . Primary osteoarthritis of left hip 09/20/2018  . Primary osteoarthritis of right hip 11/21/2018    . Status post total hip replacement, left 04/10/2019  . Status post total replacement of left hip 01/15/2019  . 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     Past Surgical History:  Procedure Laterality Date  . INGUINAL HERNIA REPAIR Right 03/08/2012   Procedure: HERNIA REPAIR INGUINAL ADULT;  Surgeon: Axel Filler, MD;  Location: [facility] OR;  Service: General;  Laterality: Right;  . INSERTION OF MESH N/A 03/08/2012   Procedure: INSERTION OF MESH;  Surgeon: Axel Filler, MD;  Location: [facility] OR;  Service: General;  Laterality: N/A;  . LEFT HEART CATHETERIZATION WITH CORONARY ANGIOGRAM Right 10/07/2012   Procedure: LEFT HEART CATHETERIZATION WITH CORONARY ANGIOGRAM;  Surgeon: Corky Crafts, MD;  Location: [facility] CATH LAB;  Service: Cardiovascular;  Laterality: Right;  . METACARPAL OSTEOTOMY Left 11/01/2018   Procedure: Left small finger osteophyte removal;  Surgeon: Tarry Kos, MD;  Location: [facility];  Service: Orthopedics;  Laterality: Left;  . PERCUTANEOUS STENT INTERVENTION  10/07/2012   Procedure: PERCUTANEOUS STENT INTERVENTION;  Surgeon: Corky Crafts, MD;  Location: [facility] CATH LAB;  Service: Cardiovascular;;  . TOTAL HIP ARTHROPLASTY Left 01/15/2019   Procedure: LEFT TOTAL HIP ARTHROPLASTY ANTERIOR APPROACH;  Surgeon: Tarry Kos, MD;  Location: [facility] OR;  Service: Orthopedics;  Laterality: Left;    Current Medications: Current Meds  Medication Sig  . ascorbic acid (VITAMIN C) 500 MG tablet Take 500-1,000 mg by mouth daily.   Marland Kitchen  atorvastatin (LIPITOR) 80 MG tablet TAKE 1 TABLET DAILY AS     NEEDED  . clopidogrel (PLAVIX) 75 MG tablet TAKE 1 TABLET DAILY  . hydroxychloroquine (PLAQUENIL) 200 MG tablet   . metoprolol tartrate (LOPRESSOR) 25 MG tablet TAKE 1 TABLET TWICE A DAY  . Multiple Vitamins-Minerals (MULTIVITAMIN WITH MINERALS) tablet Take 1 tablet by mouth every evening.  . nitroGLYCERIN (NITROSTAT) 0.4 MG SL tablet Place 1 tablet (0.4 mg total) under the tongue every 5  (five) minutes x 3 doses as needed for chest pain.     Allergies:   Coconut fatty acids and Penicillins   Social History   Socioeconomic History  . Marital status: Married    Spouse name: Not on file  . Number of children: Not on file  . Years of education: Not on file  . Highest education level: Not on file  Occupational History  . Not on file  Tobacco Use  . Smoking status: Former Smoker    Packs/day: 1.50    Years: 7.00    Pack years: 10.50    Types: Cigarettes    Quit date: 10/01/2012    Years since quitting: 7.1  . Smokeless tobacco: Never Used  Vaping Use  . Vaping Use: Never used  Substance and Sexual Activity  . Alcohol use: Not Currently    Comment: occassionally  . Drug use: No  . Sexual activity: Yes  Other Topics Concern  . Not on file  Social History Narrative   Drinks 2 cheerwines daily.   Social Determinants of Health   Financial Resource Strain:   . Difficulty of Paying Living Expenses: Not on file  Food Insecurity:   . Worried About Programme researcher, broadcasting/film/video in the Last Year: Not on file  . Ran Out of Food in the Last Year: Not on file  Transportation Needs:   . Lack of Transportation (Medical): Not on file  . Lack of Transportation (Non-Medical): Not on file  Physical Activity:   . Days of Exercise per Week: Not on file  . Minutes of Exercise per Session: Not on file  Stress:   . Feeling of Stress : Not on file  Social Connections:   . Frequency of Communication with Friends and Family: Not on file  . Frequency of Social Gatherings with Friends and Family: Not on file  . Attends Religious Services: Not on file  . Active Member of Clubs or Organizations: Not on file  . Attends Banker Meetings: Not on file  . Marital Status: Not on file     Family History: The patient's family history includes CAD in his father; Heart attack in his father; Heart disease in his father. ROS:   Please see the history of present illness.    All 14  point review of systems negative except as described per history of present illness  EKGs/Labs/Other Studies Reviewed:      Recent Labs: 01/08/2019: ALT 48; BUN 11; Creatinine, Ser 1.16; Hemoglobin 15.8; Platelets 206; Potassium 4.4; Sodium 141  Recent Lipid Panel    Component Value Date/Time   CHOL 111 07/13/2018 1432   CHOL CANCELED 11/07/2014 0921   CHOL SEE BELOW 11/07/2014 0921   CHOL 128 04/26/2014 0757   TRIG 188 (H) 07/13/2018 1432   TRIG CANCELED 11/07/2014 0921   TRIG SEE BELOW 11/07/2014 0921   TRIG 140 04/26/2014 0757   HDL 37 (L) 07/13/2018 1432   HDL CANCELED 11/07/2014 0921   HDL SEE BELOW 11/07/2014 2878  HDL 39 (L) 04/26/2014 0757   CHOLHDL 3.0 07/13/2018 1432   CHOLHDL 2.6 04/18/2015 0930   VLDL 22 04/18/2015 0930   LDLCALC 36 07/13/2018 1432   LDLCALC CANCELED 11/07/2014 0921   LDLCALC SEE BELOW 11/07/2014 0921   LDLCALC 61 04/26/2014 0757    Physical Exam:    VS:  BP 130/72 (BP Location: Right Arm, Patient Position: Sitting)   Pulse 86   Ht 5\' 11"  (1.803 m)   Wt 279 lb (126.6 kg)   SpO2 95%   BMI 38.91 kg/m     Wt Readings from Last 3 Encounters:  12/05/19 279 lb (126.6 kg)  08/20/19 (!) 291 lb 3.2 oz (132.1 kg)  05/30/19 286 lb (129.7 kg)     GEN:  Well nourished, well developed in no acute distress HEENT: Normal NECK: No JVD; No carotid bruits LYMPHATICS: No lymphadenopathy CARDIAC: RRR, no murmurs, no rubs, no gallops RESPIRATORY:  Clear to auscultation without rales, wheezing or rhonchi  ABDOMEN: Soft, non-tender, non-distended MUSCULOSKELETAL:  No edema; No deformity  SKIN: Warm and dry LOWER EXTREMITIES: no swelling NEUROLOGIC:  Alert and oriented x 3 PSYCHIATRIC:  Normal affect   ASSESSMENT:    1. Mixed hyperlipidemia   2. Coronary artery disease involving native coronary artery of native heart without angina pectoris   3. OSA on CPAP   4. Status post total replacement of left hip    PLAN:    In order of problems listed  above:  1. Mixed dyslipidemia I will ask him to have fasting lipid profile done today.  I did review his K PN and I have data from June of last year showing LDL of 36 and HDL 37.  We will repeat the test 2. Coronary disease: No signs and symptoms indicating reactivation of the problem, will continue monitoring. 3. Obstructive sleep apnea follow-up by the medicine team, and CPAP mask,  4. Status post total left hip replacement recover completely doing well.   Medication Adjustments/Labs and Tests Ordered: Current medicines are reviewed at length with the patient today.  Concerns regarding medicines are outlined above.  Orders Placed This Encounter  Procedures  . Lipid Profile   Medication changes: No orders of the defined types were placed in this encounter.   Signed, July, MD, [facility] 12/05/2019 11:38 AM    [facility]

## 2019-12-06 LAB — LIPID PANEL
Chol/HDL Ratio: 2.5 ratio (ref 0.0–5.0)
Cholesterol, Total: 101 mg/dL (ref 100–199)
HDL: 40 mg/dL (ref >39)
LDL Chol Calc (NIH): 36 mg/dL (ref 0–99)
Triglycerides: 149 mg/dL (ref 0–149)
VLDL Cholesterol Cal: 25 mg/dL (ref 5–40)

## 2020-02-12 ENCOUNTER — Telehealth: Payer: Self-pay

## 2020-02-12 NOTE — Telephone Encounter (Signed)
Roland Jones's dental office would like a note/letter faxed for pre dental antibiotics before dental procedures.  Would like fax to state which medication and how much?  Fax# [phone].  Cb# [phone].  Please advise.  Thank you.

## 2020-02-13 NOTE — Telephone Encounter (Signed)
And faxed

## 2020-02-13 NOTE — Telephone Encounter (Signed)
Note completed 

## 2020-04-10 ENCOUNTER — Telehealth: Payer: Self-pay | Admitting: Adult Health

## 2020-04-10 NOTE — Telephone Encounter (Signed)
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  Will do Monday pt aware.  email to him Monday [email or web address].  Pt aware.

## 2020-04-10 NOTE — Telephone Encounter (Signed)
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

## 2020-04-14 NOTE — Telephone Encounter (Signed)
Faxed to pt today [email or web address].

## 2020-04-14 NOTE — Telephone Encounter (Signed)
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

## 2020-06-08 NOTE — Progress Notes (Signed)
Virtual Visit via Telephone Note  I connected with Wayne Jordan, on 06/09/2020 at 9:34 AM by telephone due to the COVID-19 pandemic and verified that I am speaking with the correct person using two identifiers.  Due to current restrictions/limitations of in-office visits due to the COVID-19 pandemic, this scheduled clinical appointment was converted to a telehealth visit.   Consent: I discussed the limitations, risks, security and privacy concerns of performing an evaluation and management service by telephone and the availability of in person appointments. I also discussed with the patient that there may be a patient responsible charge related to this service. The patient expressed understanding and agreed to proceed.  Location of Patient: Home  Location of Provider: [facility]   Persons participating in Telemedicine visit: Keenan Bachelor, NP Margorie John, CMA   History of Present Illness: Wayne Kruse. Wayne Jordan is a 60 year-old male who presents to establish care. PMH significant for CAD - LAD DES in setting of NSTEMI September 2014, old myocardial infarction, coronary artery disease, mixed hyperlipidemia, primary osteoarthritis of left hip, and primary osteoarthritis of right hip.   Current issues and/or concerns: Reports he is followed by Cardiology for coronary artery disease and hyperlipidemia. Reports he is followed by Neurology for sleep apnea/sleep studies.  Concern for umbilical hernia present for 7 years. Reports seen in the past by providers and told to monitor. Worsening since the last 4 months. Reports getting larger, hard, and turning red.     Past Medical History:  Diagnosis Date  . 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 CAD- LAD DES in setting of NSTEMI Sept 2014 04/06/2013  . Coronary artery disease    s/P PCI of the LAD with DES.  Nuclear stress test 04/2014 with no ischemia  .  Displaced fracture of proximal phalanx of left little finger with routine healing 08/01/2018  . Encounter for long-term (current) use of other medications 04/06/2013  . Hypercholesteremia   . Hyperlipidemia   . Mixed hyperlipidemia 04/06/2013  . Morbid obesity due to excess calories (HCC) 05/26/2015  . Nondisplaced fracture of proximal phalanx of left great toe, initial encounter for closed fracture 08/01/2018  . Obesity, unspecified 10/08/2012  . Old MI (myocardial infarction) 04/06/2013  . OSA on CPAP    followed by Dr. Jessie Foot  . Personal history of urinary calculi   . Primary osteoarthritis of left hip 09/20/2018  . Primary osteoarthritis of right hip 11/21/2018  . Status post total hip replacement, left 04/10/2019  . Status post total replacement of left hip 01/15/2019  . 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    Allergies  Allergen Reactions  . Coconut Fatty Acids Shortness Of Breath and Swelling  . Penicillins Hives and Rash    Did it involve swelling of the face/tongue/throat, SOB, or low BP? Yes Did it involve sudden or severe rash/hives, skin peeling, or any reaction on the inside of your mouth or nose? No Did you need to seek medical attention at a hospital or doctor's office? No When did it last happen?unknown  If all above answers are "NO", may proceed with cephalosporin use.     Current Outpatient Medications on File Prior to Visit  Medication Sig Dispense Refill  . ascorbic acid (VITAMIN C) 500 MG tablet Take 500-1,000 mg by mouth daily.     Marland Kitchen atorvastatin (LIPITOR) 80 MG tablet TAKE 1 TABLET DAILY AS     NEEDED  90 tablet 3  . clopidogrel (PLAVIX) 75 MG tablet TAKE 1 TABLET DAILY 90 tablet 3  . hydroxychloroquine (PLAQUENIL) 200 MG tablet     . metoprolol tartrate (LOPRESSOR) 25 MG tablet TAKE 1 TABLET TWICE A DAY 180 tablet 3  . Multiple Vitamins-Minerals (MULTIVITAMIN WITH MINERALS) tablet Take 1 tablet by mouth every evening.    . nitroGLYCERIN (NITROSTAT) 0.4 MG SL tablet  Place 1 tablet (0.4 mg total) under the tongue every 5 (five) minutes x 3 doses as needed for chest pain. 25 tablet 3   No current facility-administered medications on file prior to visit.    Observations/Objective: Alert and oriented x 3. Not in acute distress. Physical examination not completed as this is a telemedicine visit.  Assessment and Plan: 1. Encounter to establish care: - Patient presents today to establish care.  - Return for annual physical examination, labs, and health maintenance. Arrive fasting meaning having no food for at least 8 hours prior to appointment. You may have only water or black coffee. Please take scheduled medications as normal.  2. Umbilical hernia without obstruction and without gangrene: - Present for at least 7 years and worsening. - Referral to General Surgery for further evaluation and management. - Ambulatory referral to General Surgery   Follow Up Instructions: Return for annual physical exam.   Patient was given clear instructions to go to Emergency Department or return to medical center if symptoms don't improve, worsen, or new problems develop.The patient verbalized understanding.  I discussed the assessment and treatment plan with the patient. The patient was provided an opportunity to ask questions and all were answered. The patient agreed with the plan and demonstrated an understanding of the instructions.   The patient was advised to call back or seek an in-person evaluation if the symptoms worsen or if the condition fails to improve as anticipated.   I provided 10 minutes total of non-face-to-face time during this encounter.   Rema Fendt, NP  [facility] [place], [place] [phone] 06/09/2020, 9:34 AM

## 2020-06-09 ENCOUNTER — Telehealth (INDEPENDENT_AMBULATORY_CARE_PROVIDER_SITE_OTHER): Payer: BC Managed Care – PPO | Admitting: Family

## 2020-06-09 ENCOUNTER — Other Ambulatory Visit: Payer: Self-pay

## 2020-06-09 DIAGNOSIS — Z7689 Persons encountering health services in other specified circumstances: Secondary | ICD-10-CM

## 2020-06-09 DIAGNOSIS — K429 Umbilical hernia without obstruction or gangrene: Secondary | ICD-10-CM | POA: Diagnosis not present

## 2020-06-09 NOTE — Progress Notes (Signed)
Establish care: umbilical hernia

## 2020-06-10 ENCOUNTER — Other Ambulatory Visit: Payer: Self-pay | Admitting: Cardiology

## 2020-06-10 NOTE — Telephone Encounter (Signed)
Refills approved and sent 

## 2020-06-17 ENCOUNTER — Other Ambulatory Visit: Payer: Self-pay | Admitting: Cardiology

## 2020-06-17 NOTE — Telephone Encounter (Signed)
Refill sent to pharmacy.   

## 2020-07-02 ENCOUNTER — Ambulatory Visit (INDEPENDENT_AMBULATORY_CARE_PROVIDER_SITE_OTHER): Payer: BC Managed Care – PPO | Admitting: Cardiology

## 2020-07-02 ENCOUNTER — Other Ambulatory Visit: Payer: Self-pay

## 2020-07-02 VITALS — BP 138/84 | HR 70 | Ht 71.0 in | Wt 277.6 lb

## 2020-07-02 DIAGNOSIS — I2510 Atherosclerotic heart disease of native coronary artery without angina pectoris: Secondary | ICD-10-CM

## 2020-07-02 DIAGNOSIS — Z9989 Dependence on other enabling machines and devices: Secondary | ICD-10-CM

## 2020-07-02 DIAGNOSIS — G4733 Obstructive sleep apnea (adult) (pediatric): Secondary | ICD-10-CM

## 2020-07-02 DIAGNOSIS — E782 Mixed hyperlipidemia: Secondary | ICD-10-CM | POA: Diagnosis not present

## 2020-07-02 DIAGNOSIS — Z96642 Presence of left artificial hip joint: Secondary | ICD-10-CM

## 2020-07-02 MED ORDER — AMLODIPINE BESYLATE 5 MG PO TABS: 5.0000 mg | ORAL_TABLET | Freq: Every day | ORAL | 1 refills | Status: DC

## 2020-07-02 NOTE — Progress Notes (Signed)
lipi

## 2020-07-02 NOTE — Progress Notes (Signed)
Cardiology Office Note:    Date:  07/02/2020   ID:  Wayne Jordan, DOB Sep 24, 1960, MRN [id number]  PCP:  Rema Fendt, NP  Cardiologist:  Gypsy Balsam, MD    Referring MD: Elias Else, MD   No chief complaint on file. I am doing fine  History of Present Illness:    Wayne Jordan is a 60 y.o. male with past medical history significant for coronary artery disease.  In September 2014 he required PTCA and stenting of left anterior descending artery.  The last year he did have a stress test done as part of her evaluation before elective hip replacement surgery, that test was negative.  He also have obstructive sleep apnea, dyslipidemia he is morbidly obese. He is coming today to my office for follow-up.  Overall he is doing well denies have any chest pain tightness squeezing pressure burning chest.  Still trying to be active and likes to do some exercises.  He said when he exercise a lot he will have some pain in his left hip distally hip he had replacement done.  Past Medical History:  Diagnosis Date   Acute myocardial infarction of other anterior wall, initial episode of care    CAD- LAD DES in setting of NSTEMI Sept 2014 04/06/2013   Coronary artery disease    s/P PCI of the LAD with DES.  Nuclear stress test 04/2014 with no ischemia   Displaced fracture of proximal phalanx of left little finger with routine healing 08/01/2018   Encounter for long-term (current) use of other medications 04/06/2013   Hypercholesteremia    Hyperlipidemia    Mixed hyperlipidemia 04/06/2013   Morbid obesity due to excess calories (HCC) 05/26/2015   Nondisplaced fracture of proximal phalanx of left great toe, initial encounter for closed fracture 08/01/2018   Obesity, unspecified 10/08/2012   Old MI (myocardial infarction) 04/06/2013   OSA on CPAP    followed by Dr. Jessie Foot   Personal history of urinary calculi    Primary osteoarthritis of left hip 09/20/2018   Primary osteoarthritis of right hip  11/21/2018   Status post total hip replacement, left 04/10/2019   Status post total replacement of left hip 01/15/2019   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     Past Surgical History:  Procedure Laterality Date   INGUINAL HERNIA REPAIR Right 03/08/2012   Procedure: HERNIA REPAIR INGUINAL ADULT;  Surgeon: Axel Filler, MD;  [facility] OR;  Service: General;  Laterality: Right;   INSERTION OF MESH N/A 03/08/2012   Procedure: INSERTION OF MESH;  Surgeon: Axel Filler, MD;  Location: [facility] OR;  Service: General;  Laterality: N/A;   LEFT HEART CATHETERIZATION WITH CORONARY ANGIOGRAM Right 10/07/2012   Procedure: LEFT HEART CATHETERIZATION WITH CORONARY ANGIOGRAM;  Surgeon: Corky Crafts, MD;  Location: [facility] CATH LAB;  Service: Cardiovascular;  Laterality: Right;   METACARPAL OSTEOTOMY Left 11/01/2018   Procedure: Left small finger osteophyte removal;  Surgeon: Tarry Kos, MD;  Location: [facility];  Service: Orthopedics;  Laterality: Left;   PERCUTANEOUS STENT INTERVENTION  10/07/2012   Procedure: PERCUTANEOUS STENT INTERVENTION;  Surgeon: Corky Crafts, MD;  Location: [facility] CATH LAB;  Service: Cardiovascular;;   TOTAL HIP ARTHROPLASTY Left 01/15/2019   Procedure: LEFT TOTAL HIP ARTHROPLASTY ANTERIOR APPROACH;  Surgeon: Tarry Kos, MD;  Location: [facility] OR;  Service: Orthopedics;  Laterality: Left;    Current Medications: Current Meds  Medication Sig   amLODipine (NORVASC) 5 MG tablet Take 1 tablet (  5 mg total) by mouth daily.   ascorbic acid (VITAMIN C) 500 MG tablet Take 500-1,000 mg by mouth daily.    atorvastatin (LIPITOR) 80 MG tablet Take 1 tablet (80 mg total) by mouth daily.   clopidogrel (PLAVIX) 75 MG tablet TAKE 1 TABLET DAILY   Multiple Vitamins-Minerals (MULTIVITAMIN WITH MINERALS) tablet Take 1 tablet by mouth every evening.   nitroGLYCERIN (NITROSTAT) 0.4 MG SL tablet Place 1 tablet (0.4 mg total) under the tongue every 5 (five) minutes x 3 doses as needed for  chest pain.   [DISCONTINUED] metoprolol tartrate (LOPRESSOR) 25 MG tablet TAKE 1 TABLET TWICE A DAY     Allergies:   Coconut fatty acids and Penicillins   Social History   Socioeconomic History   Marital status: Married    Spouse name: Not on file   Number of children: Not on file   Years of education: Not on file   Highest education level: Not on file  Occupational History   Not on file  Tobacco Use   Smoking status: Former    Packs/day: 1.50    Years: 7.00    Pack years: 10.50    Types: Cigarettes    Quit date: 10/01/2012    Years since quitting: 7.7   Smokeless tobacco: Never  Vaping Use   Vaping Use: Never used  Substance and Sexual Activity   Alcohol use: Not Currently    Comment: occassionally   Drug use: No   Sexual activity: Yes  Other Topics Concern   Not on file  Social History Narrative   Drinks 2 cheerwines daily.   Social Determinants of Health   Financial Resource Strain: Not on file  Food Insecurity: Not on file  Transportation Needs: Not on file  Physical Activity: Not on file  Stress: Not on file  Social Connections: Not on file     Family History: The patient's family history includes CAD in his father; Heart attack in his father; Heart disease in his father. ROS:   Please see the history of present illness.    All 14 point review of systems negative except as described per history of present illness  EKGs/Labs/Other Studies Reviewed:      Recent Labs: No results found for requested labs within last 8760 hours.  Recent Lipid Panel    Component Value Date/Time   CHOL 101 12/05/2019 1141   CHOL CANCELED 11/07/2014 0921   CHOL SEE BELOW 11/07/2014 0921   CHOL 128 04/26/2014 0757   TRIG 149 12/05/2019 1141   TRIG CANCELED 11/07/2014 0921   TRIG SEE BELOW 11/07/2014 0921   TRIG 140 04/26/2014 0757   HDL 40 12/05/2019 1141   HDL CANCELED 11/07/2014 0921   HDL SEE BELOW 11/07/2014 0921   HDL 39 (L) 04/26/2014 0757   CHOLHDL 2.5  12/05/2019 1141   CHOLHDL 2.6 04/18/2015 0930   VLDL 22 04/18/2015 0930   LDLCALC 36 12/05/2019 1141   LDLCALC CANCELED 11/07/2014 0921   LDLCALC SEE BELOW 11/07/2014 0921   LDLCALC 61 04/26/2014 0757    Physical Exam:    VS:  BP 138/84 (BP Location: Left Arm, Patient Position: Sitting, Cuff Size: Large)   Pulse 70   Ht 5\' 11"  (1.803 m)   Wt 277 lb 9.6 oz (125.9 kg)   SpO2 96%   BMI 38.72 kg/m     Wt Readings from Last 3 Encounters:  07/02/20 277 lb 9.6 oz (125.9 kg)  12/05/19 279 lb (126.6 kg)  08/20/19 10/20/19)  291 lb 3.2 oz (132.1 kg)     GEN:  Well nourished, well developed in no acute distress HEENT: Normal NECK: No JVD; No carotid bruits LYMPHATICS: No lymphadenopathy CARDIAC: RRR, no murmurs, no rubs, no gallops RESPIRATORY:  Clear to auscultation without rales, wheezing or rhonchi  ABDOMEN: Soft, non-tender, non-distended MUSCULOSKELETAL:  No edema; No deformity  SKIN: Warm and dry LOWER EXTREMITIES: no swelling NEUROLOGIC:  Alert and oriented x 3 PSYCHIATRIC:  Normal affect   ASSESSMENT:    1. Coronary artery disease involving native coronary artery of native heart without angina pectoris   2. Status post total replacement of left hip   3. Mixed hyperlipidemia   4. OSA on CPAP    PLAN:    In order of problems listed above:  Coronary artery disease with PTCA and stenting in LAD in 2014 doing well asymptomatic no chest pain tightness squeezing pressure burning chest.  We will continue risk factors modifications. Status post left hip replacement doing well from that point review. Dyslipidemia I did review his K PN from November of last year his LDL was 36 HDL 40.  We will check his fasting lipid profile.  He is on Lipitor 80 which I will continue for now Obstructive sleep apnea that being followed by antimedicine team. We talk about need to exercise on the regular basis we also talked about a good diet. Erectile dysfunction which is a new problem.  I suspect  portion of this is related to beta-blocker.  This is more than 8 years after his event therefore since he does have preserved left ventricle ejection fraction I think we can discontinue beta-blocker.  I will put him on Norvasc 5 mg to help with the blood pressure. Essential hypertension plan as described above.  His blood pressure seems to be slightly elevated hopefully will be better controlled with changing of metoprolol to amlodipine   Medication Adjustments/Labs and Tests Ordered: Current medicines are reviewed at length with the patient today.  Concerns regarding medicines are outlined above.  Orders Placed This Encounter  Procedures   Lipid panel   EKG 12-Lead   Medication changes:  Meds ordered this encounter  Medications   amLODipine (NORVASC) 5 MG tablet    Sig: Take 1 tablet (5 mg total) by mouth daily.    Dispense:  90 tablet    Refill:  1    Signed, Georgeanna Lea, MD, [facility] 07/02/2020 11:38 AM    [facility]

## 2020-07-02 NOTE — Patient Instructions (Signed)
Medication Instructions:  Your physician has recommended you make the following change in your medication:  STOP: Metoprolol   START: Amlodipine 5 mg daily   *If you need a refill on your cardiac medications before your next appointment, please call your pharmacy*   Lab Work: Your physician recommends that you return for lab work when fasting : lipid  If you have labs (blood work) drawn today and your tests are completely normal, you will receive your results only by: MyChart Message (if you have MyChart) OR A paper copy in the mail If you have any lab test that is abnormal or we need to change your treatment, we will call you to review the results.   Testing/Procedures: None   Follow-Up: At [facility], you and your health needs are our priority.  As part of our continuing mission to provide you with exceptional heart care, we have created designated Provider Care Teams.  These Care Teams include your primary Cardiologist (physician) and Advanced Practice Providers (APPs -  Physician Assistants and Nurse Practitioners) who all work together to provide you with the care you need, when you need it.  We recommend signing up for the patient portal called "MyChart".  Sign up information is provided on this After Visit Summary.  MyChart is used to connect with patients for Virtual Visits (Telemedicine).  Patients are able to view lab/test results, encounter notes, upcoming appointments, etc.  Non-urgent messages can be sent to your provider as well.   To learn more about what you can do with MyChart, go to [email or web address].    Your next appointment:   1 year(s)  The format for your next appointment:   In Person  Provider:   Gypsy Balsam, MD   Other Instructions  Amlodipine Tablets What is this medication? AMLODIPINE (am LOE di peen) treats high blood pressure and prevents chest pain (angina). It works by relaxing the blood vessels, which helps decrease the amount of  work your heart has to do. It belongs to a group of medicationscalled calcium channel blockers. This medicine may be used for other purposes; ask your health care provider orpharmacist if you have questions. COMMON BRAND NAME(S): Norvasc What should I tell my care team before I take this medication? They need to know if you have any of these conditions: Heart disease Liver disease An unusual or allergic reaction to amlodipine, other medications, foods, dyes, or preservatives Pregnant or trying to get pregnant Breast-feeding How should I use this medication? Take this medication by mouth. Take it as directed on the prescription label at the same time every day. You can take it with or without food. If it upsets your stomach, take it with food. Keep taking it unless your care team tells youto stop. Talk to your care team about the use of this medication in children. While it may be prescribed for children as young as 6 for selected conditions,precautions do apply. Overdosage: If you think you have taken too much of this medicine contact apoison control center or emergency room at once. NOTE: This medicine is only for you. Do not share this medicine with others. What if I miss a dose? If you miss a dose, take it as soon as you can. If it is almost time for yournext dose, take only that dose. Do not take double or extra doses. What may interact with this medication? Clarithromycin Cyclosporine Diltiazem Itraconazole Simvastatin Tacrolimus This list may not describe all possible interactions. Give your health care provider a  list of all the medicines, herbs, non-prescription drugs, or dietary supplements you use. Also tell them if you smoke, drink alcohol, or use illegaldrugs. Some items may interact with your medicine. What should I watch for while using this medication? Visit your health care provider for regular checks on your progress. Check your blood pressure as directed. Ask your health  care provider what your bloodpressure should be. Also, find out when you should contact him or her. Do not treat yourself for coughs, colds, or pain while you are using this medication without asking your health care provider for advice. Somemedications may increase your blood pressure. You may get drowsy or dizzy. Do not drive, use machinery, or do anything that needs mental alertness until you know how this medication affects you. Do not stand up or sit up quickly, especially if you are an older patient. This reduces the risk of dizzy or fainting spells. Alcohol can make you more drowsyand dizzy. Avoid alcoholic drinks. What side effects may I notice from receiving this medication? Side effects that you should report to your care team as soon as possible: Allergic reactions-skin rash, itching, hives, swelling of the face, lips, tongue, or throat Heart attack-pain or tightness in the chest, shoulders, arms, or jaw, nausea, shortness of breath, cold or clammy skin, feeling faint or lightheaded Low blood pressure-dizziness, feeling faint or lightheaded, blurry vision Side effects that usually do not require medical attention (report these toyour care team if they continue or are bothersome): Facial flushing, redness Heart palpitations-rapid, pounding, or irregular heartbeat Nausea Stomach pain Swelling of the ankles, hands, or feet This list may not describe all possible side effects. Call your doctor for medical advice about side effects. You may report side effects to FDA at1-800-FDA-1088. Where should I keep my medication? Keep out of the reach of children and pets. Store at room temperature between 20 and 25 degrees C (68 and 77 degrees F). Protect from light and moisture. Keep the container tightly closed. Get rid ofany unused medication after the expiration date. To get rid of medications that are no longer needed or have expired: Take the medication to a medication take-back program. Check  with your pharmacy or law enforcement to find a location. If you cannot return the medication, check the label or package insert to see if the medication should be thrown out in the garbage or flushed down the toilet. If you are not sure, ask your health care provider. If it is safe to put in the trash, empty the medication out of the container. Mix the medication with cat litter, dirt, coffee grounds, or other unwanted substance. Seal the mixture in a bag or container. Put it in the trash. NOTE: This sheet is a summary. It may not cover all possible information. If you have questions about this medicine, talk to your doctor, pharmacist, orhealth care provider.  2022 Elsevier/Gold Standard (2019-12-01 14:59:47)

## 2020-07-03 LAB — LIPID PANEL
Chol/HDL Ratio: 3 ratio (ref 0.0–5.0)
Cholesterol, Total: 112 mg/dL (ref 100–199)
HDL: 37 mg/dL — ABNORMAL LOW (ref >39)
LDL Chol Calc (NIH): 50 mg/dL (ref 0–99)
Triglycerides: 145 mg/dL (ref 0–149)
VLDL Cholesterol Cal: 25 mg/dL (ref 5–40)

## 2020-07-08 NOTE — Progress Notes (Signed)
Patient ID: Wayne Jordan, male    DOB: 16-Oct-1960  MRN: [id number]  CC: Annual Physical Exam  Subjective: Wayne Jordan is a 60 y.o. male who presents for annual physical exam.   His concerns today include: none.   Patient Active Problem List   Diagnosis Date Noted   Coronary artery disease    Hypercholesteremia    Hyperlipidemia    Personal history of urinary calculi    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    Status post total hip replacement, left 04/10/2019   Status post total replacement of left hip 01/15/2019   Primary osteoarthritis of right hip 11/21/2018   Primary osteoarthritis of left hip 09/20/2018   Nondisplaced fracture of proximal phalanx of left great toe, initial encounter for closed fracture 08/01/2018   Displaced fracture of proximal phalanx of left little finger with routine healing 08/01/2018   OSA on CPAP 05/26/2015   Morbid obesity due to excess calories (HCC) 05/26/2015   CAD- LAD DES in setting of NSTEMI Sept 2014 04/06/2013   Mixed hyperlipidemia 04/06/2013   Old MI (myocardial infarction) 04/06/2013   Encounter for long-term (current) use of other medications 04/06/2013   Obesity, unspecified 10/08/2012     Current Outpatient Medications on File Prior to Visit  Medication Sig Dispense Refill   amLODipine (NORVASC) 5 MG tablet Take 1 tablet (5 mg total) by mouth daily. 90 tablet 1   ascorbic acid (VITAMIN C) 500 MG tablet Take 500-1,000 mg by mouth daily.      atorvastatin (LIPITOR) 80 MG tablet Take 1 tablet (80 mg total) by mouth daily. 90 tablet 1   clopidogrel (PLAVIX) 75 MG tablet TAKE 1 TABLET DAILY 90 tablet 1   Multiple Vitamins-Minerals (MULTIVITAMIN WITH MINERALS) tablet Take 1 tablet by mouth every evening.     nitroGLYCERIN (NITROSTAT) 0.4 MG SL tablet Place 1 tablet (0.4 mg total) under the tongue every 5 (five) minutes x 3 doses as needed for chest pain. 25 tablet 3   No current facility-administered medications on file prior to visit.     Allergies  Allergen Reactions   Coconut Fatty Acids Shortness Of Breath and Swelling   Penicillins Hives and Rash    Did it involve swelling of the face/tongue/throat, SOB, or low BP? Yes Did it involve sudden or severe rash/hives, skin peeling, or any reaction on the inside of your mouth or nose? No Did you need to seek medical attention at a hospital or doctor's office? No When did it last happen?      unknown  If all above answers are "NO", may proceed with cephalosporin use.     Social History   Socioeconomic History   Marital status: Married    Spouse name: Not on file   Number of children: Not on file   Years of education: Not on file   Highest education level: Not on file  Occupational History   Not on file  Tobacco Use   Smoking status: Former    Packs/day: 1.50    Years: 7.00    Pack years: 10.50    Types: Cigarettes    Quit date: 10/01/2012    Years since quitting: 7.7   Smokeless tobacco: Never  Vaping Use   Vaping Use: Never used  Substance and Sexual Activity   Alcohol use: Not Currently    Comment: occassionally   Drug use: No   Sexual activity: Yes  Other Topics Concern   Not on file  Social History  Narrative   Drinks 2 cheerwines daily.   Social Determinants of Health   Financial Resource Strain: Not on file  Food Insecurity: Not on file  Transportation Needs: Not on file  Physical Activity: Not on file  Stress: Not on file  Social Connections: Not on file  Intimate Partner Violence: Not on file    Family History  Problem Relation Age of Onset   Heart disease Father    Heart attack Father    CAD Father     Past Surgical History:  Procedure Laterality Date   INGUINAL HERNIA REPAIR Right 03/08/2012   Procedure: HERNIA REPAIR INGUINAL ADULT;  Surgeon: Axel Filler, MD;  Location: [facility] OR;  Service: General;  Laterality: Right;   INSERTION OF MESH N/A 03/08/2012   Procedure: INSERTION OF MESH;  Surgeon: Axel Filler, MD;  Location:  [facility] OR;  Service: General;  Laterality: N/A;   LEFT HEART CATHETERIZATION WITH CORONARY ANGIOGRAM Right 10/07/2012   Procedure: LEFT HEART CATHETERIZATION WITH CORONARY ANGIOGRAM;  Surgeon: Corky Crafts, MD;  Location: [facility] CATH LAB;  Service: Cardiovascular;  Laterality: Right;   METACARPAL OSTEOTOMY Left 11/01/2018   Procedure: Left small finger osteophyte removal;  Surgeon: Tarry Kos, MD;  Location: [facility];  Service: Orthopedics;  Laterality: Left;   PERCUTANEOUS STENT INTERVENTION  10/07/2012   Procedure: PERCUTANEOUS STENT INTERVENTION;  Surgeon: Corky Crafts, MD;  Location: [facility] CATH LAB;  Service: Cardiovascular;;   TOTAL HIP ARTHROPLASTY Left 01/15/2019   Procedure: LEFT TOTAL HIP ARTHROPLASTY ANTERIOR APPROACH;  Surgeon: Tarry Kos, MD;  Location: [facility] OR;  Service: Orthopedics;  Laterality: Left;    ROS: Review of Systems Negative except as stated above  PHYSICAL EXAM: BP 116/79 (BP Location: Left Arm, Patient Position: Sitting, Cuff Size: Large)   Pulse 70   Temp 98.1 F (36.7 C)   Resp 15   Ht 5' 10.98" (1.803 m)   Wt 277 lb (125.6 kg)   SpO2 95%   BMI 38.65 kg/m   Physical Exam General appearance - alert, well appearing, and in no distress and oriented to person, place, and time Mental status - alert, oriented to person, place, and time, normal mood, behavior, speech, dress, motor activity, and thought processes Eyes - pupils equal and reactive, extraocular eye movements intact Ears - bilateral TM's and external ear canals normal Mouth - mucous membranes moist, pharynx normal without lesions Neck - supple, no significant adenopathy Lymphatics - no palpable lymphadenopathy, no hepatosplenomegaly Chest - clear to auscultation, no wheezes, rales or rhonchi, symmetric air entry, no tachypnea, retractions or cyanosis Heart - normal rate, regular rhythm, normal S1, S2, no murmurs, rubs, clicks or gallops Abdomen - soft, nontender,  nondistended, no masses or organomegaly, abdominal hernia located at the navel about the size of a golf ball, patient reports he did hear [facility] regarding appointment GU Male - patient declined exam Neurological - alert, oriented, normal speech, no focal findings or movement disorder noted Musculoskeletal - no joint tenderness, deformity or swelling, no muscular tenderness noted Extremities - peripheral pulses normal, no pedal edema, no clubbing or cyanosis Skin - normal coloration and turgor, no rashes, no suspicious skin lesions noted  ASSESSMENT AND PLAN: 1. Annual physical exam: - Counseled on 150 minutes of exercise per week as tolerated, healthy eating (including decreased daily intake of saturated fats, cholesterol, added sugars, sodium), STI prevention, and routine healthcare maintenance.  2. Screening for metabolic disorder: - CMP to check kidney function, liver  function, and electrolyte balance.  - Comprehensive metabolic panel  3. Screening for deficiency anemia: - CBC to screen for anemia. - CBC  4. Diabetes mellitus screening: - Hemoglobin A1c to screen for pre-diabetes/diabetes. - Hemoglobin A1c  5. Thyroid disorder screen: - TSH to check thyroid function.  - TSH  6. Need for hepatitis C screening test: - Hepatitis C antibody to screen for hepatitis C.  - Hepatitis C Antibody  7. Encounter for screening for HIV: - HIV antibody to screen for human immunodeficiency virus.  - HIV antibody (with reflex)  8. Colon cancer screening: - Referral to Gastroenterology for colon cancer screening by colonoscopy. - Ambulatory referral to Gastroenterology  9. Mixed hyperlipidemia: - Continue current regimen.  - Managed by Cardiology last visit 07/02/2020.   10. Need for shingles vaccine: - Administered today in office.  - Varicella-zoster vaccine IM   Patient was given the opportunity to ask questions.  Patient verbalized understanding of the plan and  was able to repeat key elements of the plan. Patient was given clear instructions to go to Emergency Department or return to medical center if symptoms don't improve, worsen, or new problems develop.The patient verbalized understanding.   Orders Placed This Encounter  Procedures   Varicella-zoster vaccine IM   HIV antibody (with reflex)   Hepatitis C Antibody   CBC   Comprehensive metabolic panel   TSH   Hemoglobin A1c   Ambulatory referral to Gastroenterology    Follow-up with primary provider as scheduled.   Rema Fendt, NP

## 2020-07-09 ENCOUNTER — Encounter: Payer: Self-pay | Admitting: Family

## 2020-07-09 ENCOUNTER — Ambulatory Visit (INDEPENDENT_AMBULATORY_CARE_PROVIDER_SITE_OTHER): Payer: BC Managed Care – PPO | Admitting: Family

## 2020-07-09 ENCOUNTER — Other Ambulatory Visit: Payer: Self-pay

## 2020-07-09 VITALS — BP 116/79 | HR 70 | Temp 98.1°F | Resp 15 | Ht 70.98 in | Wt 277.0 lb

## 2020-07-09 DIAGNOSIS — E782 Mixed hyperlipidemia: Secondary | ICD-10-CM

## 2020-07-09 DIAGNOSIS — Z23 Encounter for immunization: Secondary | ICD-10-CM | POA: Diagnosis not present

## 2020-07-09 DIAGNOSIS — Z13228 Encounter for screening for other metabolic disorders: Secondary | ICD-10-CM

## 2020-07-09 DIAGNOSIS — Z131 Encounter for screening for diabetes mellitus: Secondary | ICD-10-CM

## 2020-07-09 DIAGNOSIS — Z1211 Encounter for screening for malignant neoplasm of colon: Secondary | ICD-10-CM

## 2020-07-09 DIAGNOSIS — Z1159 Encounter for screening for other viral diseases: Secondary | ICD-10-CM

## 2020-07-09 DIAGNOSIS — Z0000 Encounter for general adult medical examination without abnormal findings: Secondary | ICD-10-CM | POA: Diagnosis not present

## 2020-07-09 DIAGNOSIS — Z114 Encounter for screening for human immunodeficiency virus [HIV]: Secondary | ICD-10-CM

## 2020-07-09 DIAGNOSIS — Z130 Encounter for screening for diseases of the blood and blood-forming organs and certain disorders involving the immune mechanism: Secondary | ICD-10-CM

## 2020-07-09 DIAGNOSIS — Z1329 Encounter for screening for other suspected endocrine disorder: Secondary | ICD-10-CM

## 2020-07-09 NOTE — Progress Notes (Signed)
Pt presents for annual physical exam  1st dose of Shingrix administered left deltoid

## 2020-07-10 ENCOUNTER — Encounter: Payer: Self-pay | Admitting: Family

## 2020-07-10 DIAGNOSIS — R7303 Prediabetes: Secondary | ICD-10-CM | POA: Insufficient documentation

## 2020-07-10 LAB — COMPREHENSIVE METABOLIC PANEL WITH GFR
ALT: 49 IU/L — ABNORMAL HIGH (ref 0–44)
AST: 39 IU/L (ref 0–40)
Albumin/Globulin Ratio: 1.7 (ref 1.2–2.2)
Albumin: 4.7 g/dL (ref 3.8–4.8)
Alkaline Phosphatase: 138 IU/L — ABNORMAL HIGH (ref 44–121)
BUN/Creatinine Ratio: 12 (ref 10–24)
BUN: 12 mg/dL (ref 8–27)
Bilirubin Total: 0.5 mg/dL (ref 0.0–1.2)
CO2: 20 mmol/L (ref 20–29)
Calcium: 9.6 mg/dL (ref 8.6–10.2)
Chloride: 103 mmol/L (ref 96–106)
Creatinine, Ser: 1.01 mg/dL (ref 0.76–1.27)
Globulin, Total: 2.8 g/dL (ref 1.5–4.5)
Glucose: 83 mg/dL (ref 65–99)
Potassium: 4.3 mmol/L (ref 3.5–5.2)
Sodium: 141 mmol/L (ref 134–144)
Total Protein: 7.5 g/dL (ref 6.0–8.5)
eGFR: 85 mL/min/1.73

## 2020-07-10 LAB — CBC
Hematocrit: 51.1 % — ABNORMAL HIGH (ref 37.5–51.0)
Hemoglobin: 16.2 g/dL (ref 13.0–17.7)
MCH: 29.1 pg (ref 26.6–33.0)
MCHC: 31.7 g/dL (ref 31.5–35.7)
MCV: 92 fL (ref 79–97)
Platelets: 202 10*3/uL (ref 150–450)
RBC: 5.57 x10E6/uL (ref 4.14–5.80)
RDW: 12.4 % (ref 11.6–15.4)
WBC: 6.1 10*3/uL (ref 3.4–10.8)

## 2020-07-10 LAB — HEPATITIS C ANTIBODY: Hep C Virus Ab: <0.1 s/co ratio (ref 0.0–0.9)

## 2020-07-10 LAB — TSH: TSH: 2.24 u[IU]/mL (ref 0.450–4.500)

## 2020-07-10 LAB — HIV ANTIBODY (ROUTINE TESTING W REFLEX): HIV Screen 4th Generation wRfx: NONREACTIVE

## 2020-07-10 LAB — HEMOGLOBIN A1C
Est. average glucose Bld gHb Est-mCnc: 120 mg/dL
Hgb A1c MFr Bld: 5.8 % — ABNORMAL HIGH (ref 4.8–5.6)

## 2020-07-10 NOTE — Progress Notes (Signed)
Kidney function normal.   Thyroid function normal.   No anemia.   Hepatitis C negative.   HIV negative.   ALT mildly elevated. This is an enzyme used to check liver function.   Some causes of an elevation of ALT may be increased alcohol consumption, high-fat diet, or overuse of NSAID's such as Ibuprofen, Aleve, Motrin, and Naproxen. Encouraged to recheck liver function in 4 to 6 weeks at lab only visit.   Hemoglobin A1c is consistent with pre-diabetes. Practice healthy eating habits of fresh fruit and vegetables, lean baked meats such as chicken, fish, and [place]; limit breads, rice, pastas, and desserts; practice regular aerobic exercise (at least 150 minutes a week as tolerated). No medication needed at the moment. Encouraged to recheck in 6 months.

## 2020-07-18 ENCOUNTER — Encounter: Payer: Self-pay | Admitting: Family

## 2020-07-22 NOTE — Telephone Encounter (Signed)
Hey Amy, for some reason unknown this referral was closed

## 2020-07-22 NOTE — Telephone Encounter (Signed)
I have sent Wayne Jordan a message for more details concerning this matter.

## 2020-08-20 ENCOUNTER — Ambulatory Visit: Payer: BC Managed Care – PPO | Admitting: Adult Health

## 2020-08-20 ENCOUNTER — Other Ambulatory Visit: Payer: Self-pay

## 2020-08-20 ENCOUNTER — Encounter: Payer: Self-pay | Admitting: Adult Health

## 2020-08-20 VITALS — BP 127/81 | HR 62 | Ht 71.0 in | Wt 284.0 lb

## 2020-08-20 DIAGNOSIS — G4733 Obstructive sleep apnea (adult) (pediatric): Secondary | ICD-10-CM | POA: Diagnosis not present

## 2020-08-20 DIAGNOSIS — Z9989 Dependence on other enabling machines and devices: Secondary | ICD-10-CM

## 2020-08-20 NOTE — Patient Instructions (Signed)
Continue using CPAP nightly and greater than 4 hours each night °If your symptoms worsen or you develop new symptoms please let us know.  ° °

## 2020-08-20 NOTE — Progress Notes (Signed)
PATIENT: Wayne Jordan DOB: 1960/02/09  REASON FOR VISIT: follow up HISTORY FROM: patient Primary neurologist: Dr. Vickey Huger  HISTORY OF PRESENT ILLNESS: Today 08/20/20:  Mr. Pham is a 60 year old male with a history of obstructive sleep apnea on CPAP.  His download indicates that he uses machine 29 out of 30 days for compliance of 96.7%.  Every night that he uses machine he used it greater than 4 hours.  On average he uses it 7 hours and 14 minutes.  His residual AHI is 1.3 on 13 cm of water.  Patient does not have a significant leak with his mask.  Overall he is doing well.  08/20/19: Mr. Brunetto is a 60 year old male with a history of obstructive sleep apnea on CPAP.  His download indicates that he uses machine nightly for compliance of 100%.  He uses machine greater than 4 hours each night.  His residual AHI is 1.9 on 13 cm of water.  Reports that the CPAP is working well for him.  Reports that he has not changed out his supplies since last year.  HISTORY The patient states that download indicates that he used his machine 27 out of 30 days for compliance of 90%.  He uses machine greater than 4 hours each night.  On average he uses his machine 7 hours and 37 minutes.  His residual AHI is 2.2 on 13 cm of water.  He states occasionally he will feel the mask leaking however he had a mask refitting and does like his new mask.  He denies any new issues.  REVIEW OF SYSTEMS: Out of a complete 14 system review of symptoms, the patient complains only of the following symptoms, and all other reviewed systems are negative.   ESS 4  ALLERGIES: Allergies  Allergen Reactions   Coconut Fatty Acids Shortness Of Breath and Swelling   Penicillins Hives and Rash    Did it involve swelling of the face/tongue/throat, SOB, or low BP? Yes Did it involve sudden or severe rash/hives, skin peeling, or any reaction on the inside of your mouth or nose? No Did you need to seek medical attention at a hospital  or doctor's office? No When did it last happen?      unknown  If all above answers are "NO", may proceed with cephalosporin use.     HOME MEDICATIONS: Outpatient Medications Prior to Visit  Medication Sig Dispense Refill   amLODipine (NORVASC) 5 MG tablet Take 1 tablet (5 mg total) by mouth daily. 90 tablet 1   ascorbic acid (VITAMIN C) 500 MG tablet Take 500-1,000 mg by mouth daily.      atorvastatin (LIPITOR) 80 MG tablet Take 1 tablet (80 mg total) by mouth daily. 90 tablet 1   clopidogrel (PLAVIX) 75 MG tablet TAKE 1 TABLET DAILY 90 tablet 1   Multiple Vitamins-Minerals (MULTIVITAMIN WITH MINERALS) tablet Take 1 tablet by mouth every evening.     nitroGLYCERIN (NITROSTAT) 0.4 MG SL tablet Place 1 tablet (0.4 mg total) under the tongue every 5 (five) minutes x 3 doses as needed for chest pain. 25 tablet 3   No facility-administered medications prior to visit.    PAST MEDICAL HISTORY: Past Medical History:  Diagnosis Date   Acute myocardial infarction of other anterior wall, initial episode of care    CAD- LAD DES in setting of NSTEMI Sept 2014 04/06/2013   Coronary artery disease    s/P PCI of the LAD with DES.  Nuclear stress test 04/2014  with no ischemia   Displaced fracture of proximal phalanx of left little finger with routine healing 08/01/2018   Encounter for long-term (current) use of other medications 04/06/2013   Hypercholesteremia    Hyperlipidemia    Mixed hyperlipidemia 04/06/2013   Morbid obesity due to excess calories (HCC) 05/26/2015   Nondisplaced fracture of proximal phalanx of left great toe, initial encounter for closed fracture 08/01/2018   Obesity, unspecified 10/08/2012   Old MI (myocardial infarction) 04/06/2013   OSA on CPAP    followed by Dr. Jessie Foot   Personal history of urinary calculi    Primary osteoarthritis of left hip 09/20/2018   Primary osteoarthritis of right hip 11/21/2018   Status post total hip replacement, left 04/10/2019   Status post total  replacement of left hip 01/15/2019   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     PAST SURGICAL HISTORY: Past Surgical History:  Procedure Laterality Date   INGUINAL HERNIA REPAIR Right 03/08/2012   Procedure: HERNIA REPAIR INGUINAL ADULT;  Surgeon: Axel Filler, MD;  Location: [facility] OR;  Service: General;  Laterality: Right;   INSERTION OF MESH N/A 03/08/2012   Procedure: INSERTION OF MESH;  Surgeon: Axel Filler, MD;  Location: [facility] OR;  Service: General;  Laterality: N/A;   LEFT HEART CATHETERIZATION WITH CORONARY ANGIOGRAM Right 10/07/2012   Procedure: LEFT HEART CATHETERIZATION WITH CORONARY ANGIOGRAM;  Surgeon: Corky Crafts, MD;  Location: [facility] CATH LAB;  Service: Cardiovascular;  Laterality: Right;   METACARPAL OSTEOTOMY Left 11/01/2018   Procedure: Left small finger osteophyte removal;  Surgeon: Tarry Kos, MD;  Location: [facility];  Service: Orthopedics;  Laterality: Left;   PERCUTANEOUS STENT INTERVENTION  10/07/2012   Procedure: PERCUTANEOUS STENT INTERVENTION;  Surgeon: Corky Crafts, MD;  Location: [facility] CATH LAB;  Service: Cardiovascular;;   TOTAL HIP ARTHROPLASTY Left 01/15/2019   Procedure: LEFT TOTAL HIP ARTHROPLASTY ANTERIOR APPROACH;  Surgeon: Tarry Kos, MD;  Location: [facility] OR;  Service: Orthopedics;  Laterality: Left;    FAMILY HISTORY: Family History  Problem Relation Age of Onset   Heart disease Father    Heart attack Father    CAD Father     SOCIAL HISTORY: Social History   Socioeconomic History   Marital status: Married    Spouse name: Not on file   Number of children: Not on file   Years of education: Not on file   Highest education level: Not on file  Occupational History   Not on file  Tobacco Use   Smoking status: Former    Packs/day: 1.50    Years: 7.00    Pack years: 10.50    Types: Cigarettes    Quit date: 10/01/2012    Years since quitting: 7.8   Smokeless tobacco: Never  Vaping Use   Vaping Use: Never used  Substance and  Sexual Activity   Alcohol use: Not Currently    Comment: occassionally   Drug use: No   Sexual activity: Yes  Other Topics Concern   Not on file  Social History Narrative   Drinks 2 cheerwines daily.   Social Determinants of Health   Financial Resource Strain: Not on file  Food Insecurity: Not on file  Transportation Needs: Not on file  Physical Activity: Not on file  Stress: Not on file  Social Connections: Not on file  Intimate Partner Violence: Not on file      PHYSICAL EXAM  There were no vitals filed for this visit.  There is no  height or weight on file to calculate BMI.  Generalized: Well developed, in no acute distress  Chest: Lungs clear to auscultation bilaterally  Neurological examination  Mentation: Alert oriented to time, place, history taking. Follows all commands speech and language fluent Cranial nerve II-XII: Extraocular movements were full, visual field were full on confrontational test Head turning and shoulder shrug  were normal and symmetric. Motor: The motor testing reveals 5 over 5 strength of all 4 extremities. Good symmetric motor tone is noted throughout.  Sensory: Sensory testing is intact to soft touch on all 4 extremities. No evidence of extinction is noted.  Gait and station: Gait is normal.    DIAGNOSTIC DATA (LABS, IMAGING, TESTING) - I reviewed patient records, labs, notes, testing and imaging myself where available.  Lab Results  Component Value Date   WBC 6.1 07/09/2020   HGB 16.2 07/09/2020   HCT 51.1 (H) 07/09/2020   MCV 92 07/09/2020   PLT 202 07/09/2020      Component Value Date/Time   NA 141 07/09/2020 1045   K 4.3 07/09/2020 1045   CL 103 07/09/2020 1045   CO2 20 07/09/2020 1045   GLUCOSE 83 07/09/2020 1045   GLUCOSE 100 (H) 01/08/2019 1145   BUN 12 07/09/2020 1045   CREATININE 1.01 07/09/2020 1045   CALCIUM 9.6 07/09/2020 1045   PROT 7.5 07/09/2020 1045   ALBUMIN 4.7 07/09/2020 1045   AST 39 07/09/2020 1045    ALT 49 (H) 07/09/2020 1045   ALKPHOS 138 (H) 07/09/2020 1045   BILITOT 0.5 07/09/2020 1045   GFRNONAA >60 01/08/2019 1145   GFRAA >60 01/08/2019 1145   Lab Results  Component Value Date   CHOL 112 07/03/2020   HDL 37 (L) 07/03/2020   LDLCALC 50 07/03/2020   TRIG 145 07/03/2020   CHOLHDL 3.0 07/03/2020   Lab Results  Component Value Date   HGBA1C 5.8 (H) 07/09/2020   No results found for: [id number] Lab Results  Component Value Date   TSH 2.240 07/09/2020      ASSESSMENT AND PLAN 60 y.o. year old male  has a past medical history of Acute myocardial infarction of other anterior wall, initial episode of care, CAD- LAD DES in setting of NSTEMI Sept 2014 (04/06/2013), Coronary artery disease, Displaced fracture of proximal phalanx of left little finger with routine healing (08/01/2018), Encounter for long-term (current) use of other medications (04/06/2013), Hypercholesteremia, Hyperlipidemia, Mixed hyperlipidemia (04/06/2013), Morbid obesity due to excess calories (HCC) (05/26/2015), Nondisplaced fracture of proximal phalanx of left great toe, initial encounter for closed fracture (08/01/2018), Obesity, unspecified (10/08/2012), Old MI (myocardial infarction) (04/06/2013), OSA on CPAP, Personal history of urinary calculi, Primary osteoarthritis of left hip (09/20/2018), Primary osteoarthritis of right hip (11/21/2018), Status post total hip replacement, left (04/10/2019), Status post total replacement of left hip (01/15/2019), and 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. here with:  OSA on CPAP  - CPAP compliance excellent - Good treatment of AHI  - Encourage patient to use CPAP nightly and > 4 hours each night - F/U in 1 year or sooner if needed   Butch Penny, MSN, NP-C 08/20/2020, 8:12 AM [facility] [address] [place], [place] [address] [phone]

## 2020-08-25 DIAGNOSIS — K429 Umbilical hernia without obstruction or gangrene: Secondary | ICD-10-CM

## 2020-08-25 HISTORY — DX: Umbilical hernia without obstruction or gangrene: K42.9

## 2020-08-28 ENCOUNTER — Telehealth: Payer: Self-pay | Admitting: Cardiology

## 2020-08-28 MED ORDER — NITROGLYCERIN 0.4 MG SL SUBL: 0.4000 mg | SUBLINGUAL_TABLET | SUBLINGUAL | 9 refills | Status: DC | PRN

## 2020-08-28 NOTE — Telephone Encounter (Signed)
*  STAT* If patient is at the pharmacy, call can be transferred to refill team.   1. Which medications need to be refilled? (please list name of each medication and dose if known)  nitroGLYCERIN (NITROSTAT) 0.4 MG SL tablet  2. Which pharmacy/location (including street and city if local pharmacy) is medication to be sent to? [id number] - [place], [place] - [address]  3. Do they need a 30 day or 90 day supply?   Patient is requesting a standard supply for emergency use.

## 2020-08-28 NOTE — Telephone Encounter (Signed)
Refill of Nitroglycerin sent to [facility] [address].

## 2020-09-03 ENCOUNTER — Other Ambulatory Visit: Payer: Self-pay

## 2020-09-03 ENCOUNTER — Ambulatory Visit (INDEPENDENT_AMBULATORY_CARE_PROVIDER_SITE_OTHER): Payer: BC Managed Care – PPO

## 2020-09-03 DIAGNOSIS — Z23 Encounter for immunization: Secondary | ICD-10-CM

## 2020-09-03 NOTE — Progress Notes (Signed)
Pt presents for shingles vaccine administered in left deltoid  tolerated injection well

## 2020-09-16 ENCOUNTER — Telehealth: Payer: Self-pay

## 2020-09-16 NOTE — Telephone Encounter (Signed)
Hi Dr. Bing Matter,   Mr. Carreira has upcoming hernia repair scheduled and will need to hold Plavix. He has a history of CAD s/p PTCA/stenting of LAD in 09/2012. Myoview in 11/2018 was low risk with no evidence of ischemia. You last saw him in 06/2020 at which time he was doing well. Can you please comment on how long Plavix can be held?   Please route response back to P [facility] PREOP.  Thank you! Tejas Seawood

## 2020-09-16 NOTE — Telephone Encounter (Signed)
   [facility] Pre-operative Risk Assessment    Patient Name: Wayne Jordan  DOB: [birth year] MRN: [id number]  HEARTCARE STAFF:  - IMPORTANT!!!!!! Under Visit Info/Reason for Call, type in Other and utilize the format Clearance MM/DD/YY or Clearance TBD. Do not use dashes or single digits. - Please review there is not already an duplicate clearance open for this procedure. - If request is for dental extraction, please clarify the # of teeth to be extracted. - If the patient is currently at the dentist's office, call Pre-Op Callback Staff (MA/nurse) to input urgent request.  - If the patient is not currently in the dentist office, please route to the Pre-Op pool.  Request for surgical clearance:  What type of surgery is being performed?  Hernia Repair  When is this surgery scheduled? TBD / will need written clearance in order to get date  What type of clearance is required (medical clearance vs. Pharmacy clearance to hold med vs. Both)?  Both  Are there any medications that need to be held prior to surgery and how long? Plavix / 5 days prior to surgery  Practice name and name of physician performing surgery?  [facility] / Michaelle Birks, MD  What is the office phone number?  [phone]   7.   What is the office fax number? [phone]  8.   Anesthesia type (None, local, MAC, general) ? General   Toni Arthurs 09/16/2020, 8:16 AM  _________________________________________________________________   (provider comments below)

## 2020-09-17 NOTE — Telephone Encounter (Signed)
Left VM

## 2020-09-19 NOTE — Telephone Encounter (Signed)
Left voice message [phone] on 09/19/2020.  Patient needs call back

## 2020-09-25 NOTE — Telephone Encounter (Signed)
Patient is returning call.  °

## 2020-09-29 NOTE — Telephone Encounter (Signed)
Per Dr. Bing Matter: Plavix can be held after 7 days   Left VM

## 2020-10-07 NOTE — Telephone Encounter (Addendum)
   Name: Wayne Jordan  DOB: 1960-09-08  MRN: [id number]   Primary Cardiologist: Gypsy Balsam, MD  Chart reviewed as part of pre-operative protocol coverage. Patient was contacted 10/07/2020 in reference to pre-operative risk assessment for pending surgery as outlined below.  Wayne Jordan was last seen on 06/2020 by Dr. Bing Matter, history reviewed. Was previously cleared for unrelated procedure in 2020 when stress test was negative. He was doing well at last OV. Reached out to patient to see how he is doing. Tried to call all #s including Abbie on DPR but got VMs. LMTCB.   Addendum: Pt returned call. The patient affirms he has been doing well without any new cardiac symptoms. Able to exert over 4 METS with exercise without any cardiac symptoms. Therefore, based on ACC/AHA guidelines, the patient would be at acceptable risk for the planned procedure without further cardiovascular testing. The patient was advised that if he develops new symptoms prior to surgery to contact our office to arrange for a follow-up visit, and he verbalized understanding.  I reached out to Dr. Bing Matter to clarify duration of holding Plavix as note below said hold Plavix after 7 days. He clarified that patient may hold Plavix for 5 days prior to procedure as requested. We typically advise that blood thinners be resumed when felt safe by performing physician.  Will route this bundled recommendation to requesting provider via Epic fax function. Please call with questions.   Laurann Montana, PA-C 10/07/2020, 9:06 AM

## 2020-10-09 IMAGING — CR DG CHEST 2V
2 series · 2 of 2 positions shown · non-contrast
Comparison: 10/06/2012

CLINICAL DATA: Preoperative for osteoarthritis of the left hip.
History of coronary artery disease.

EXAM:
CHEST - 2 VIEW

[w chest pa]
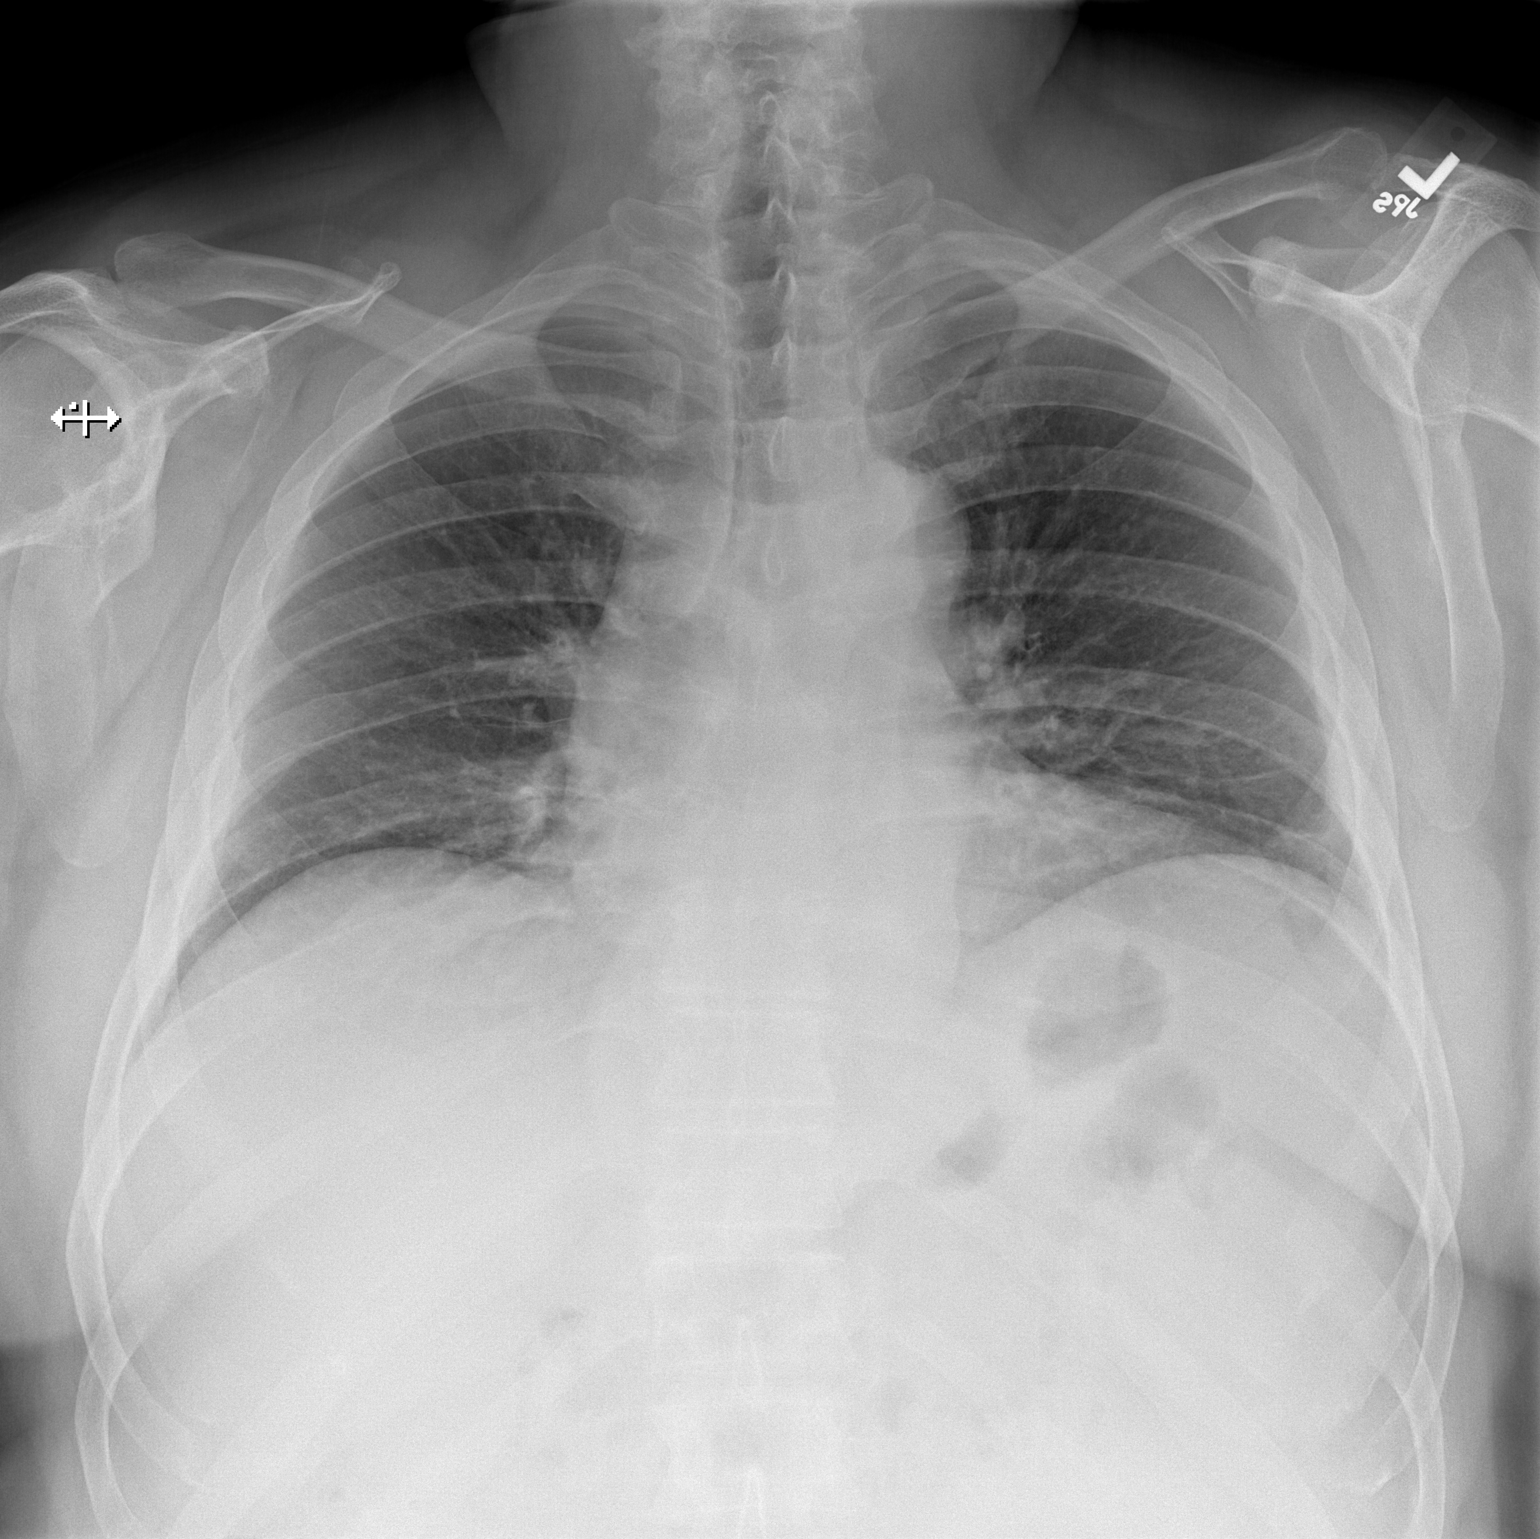

[w chest lat]
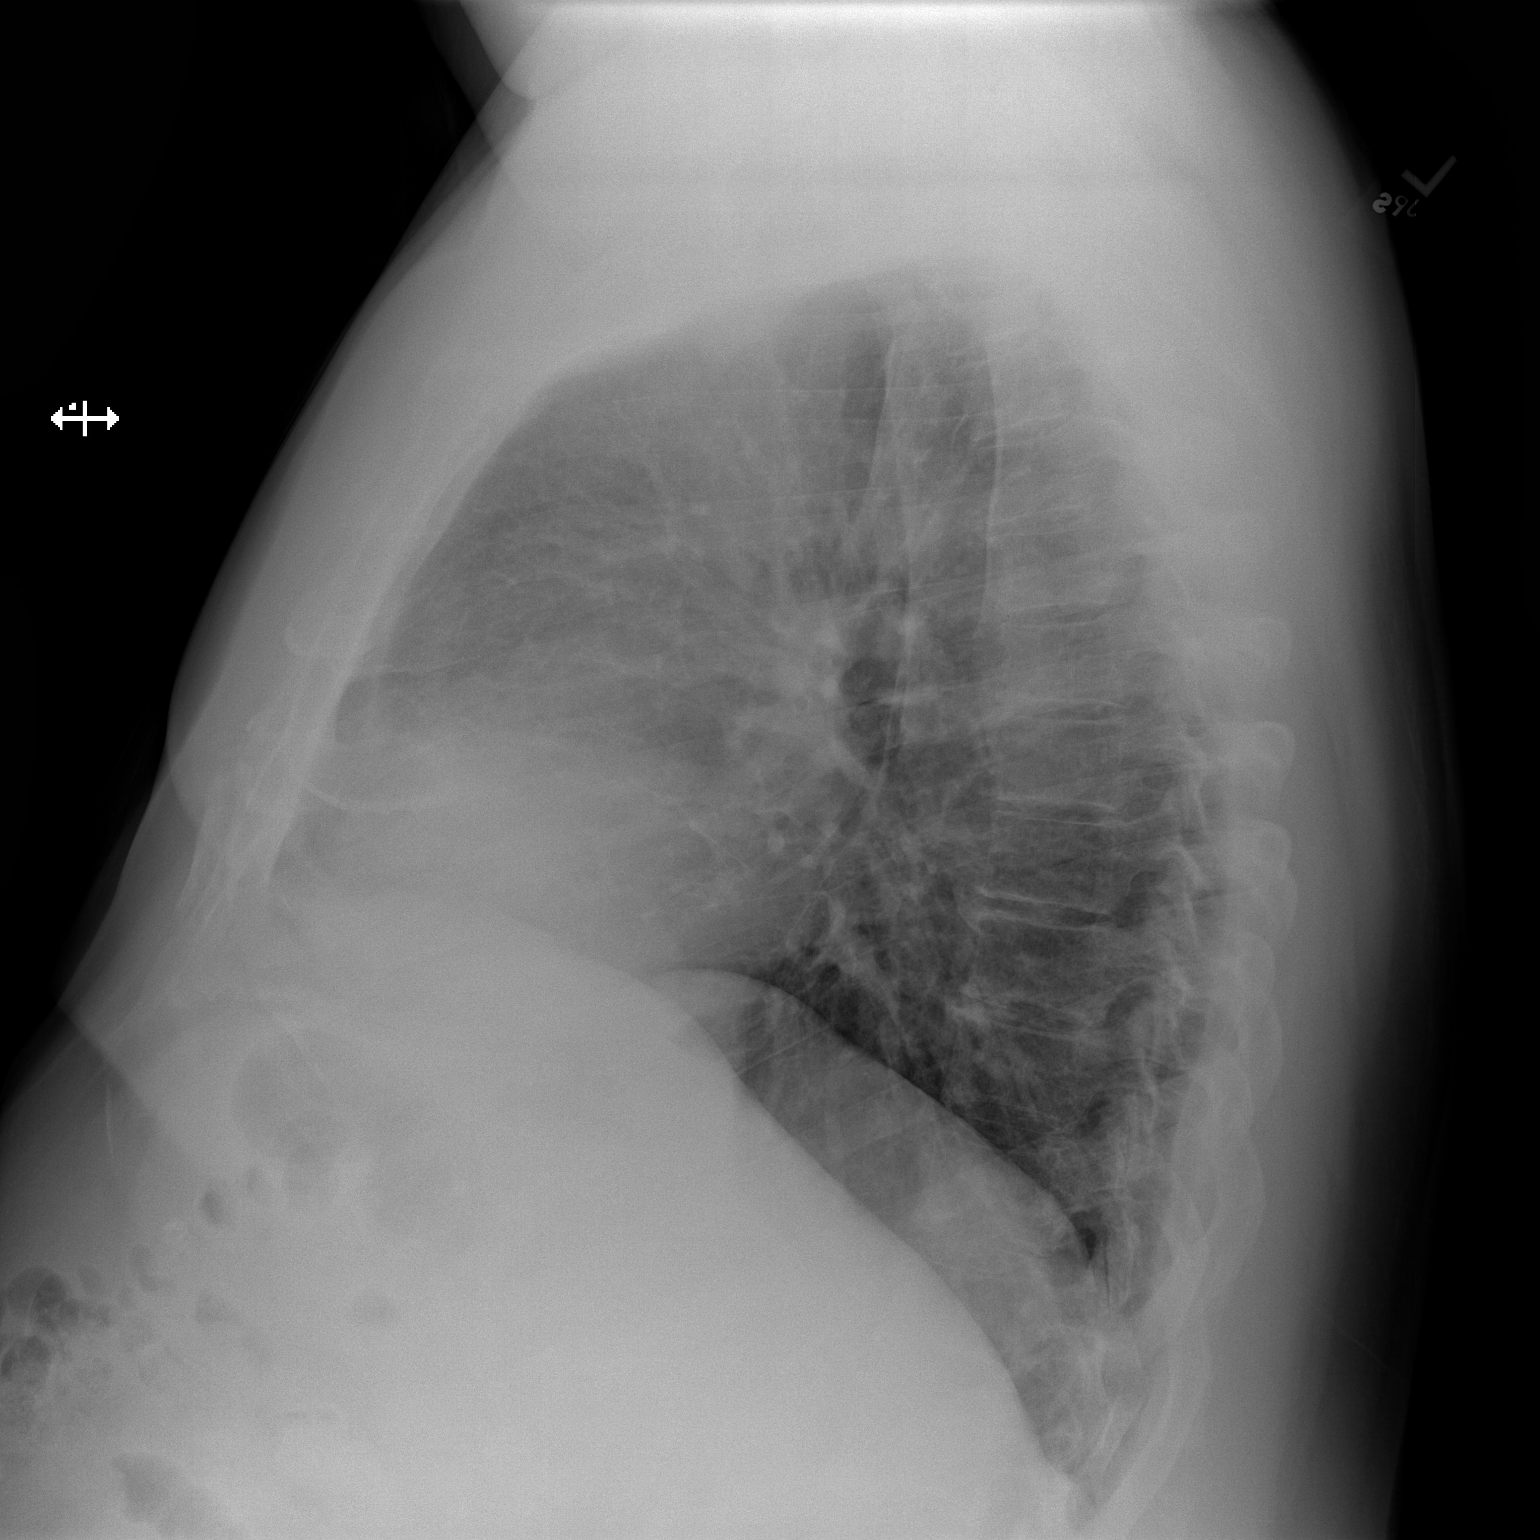

[2 of 2 positions shown; findings below may reference images not displayed]

FINDINGS: Low lung volumes are present, causing crowding of the pulmonary
vasculature. Mild central airway thickening. Subsegmental
atelectasis at the left lung base. Lungs appear otherwise clear.
Heart size within normal limits given the projection. No pleural
effusion.
IMPRESSION: 1. Central airway thickening, cannot exclude reactive airways or
mild bronchitis.
2. Low lung volumes.
3. Subsegmental atelectasis at the left lung base.

## 2020-10-13 ENCOUNTER — Ambulatory Visit: Payer: Self-pay | Admitting: Surgery

## 2020-10-13 NOTE — Pre-Procedure Instructions (Addendum)
Surgical Instructions    Your procedure is scheduled on Wednesday 10/22/20.   Report to [facility] Main Entrance "A" at 08:30 A.M., then check in with the Admitting office.  Call this number if you have problems the morning of surgery:  [phone]   If you have any questions prior to your surgery date call [phone]: Open Monday-Friday 8am-4pm    Remember:  Do not eat or drink after midnight the night before your surgery     Take these medicines the morning of surgery with A SIP OF WATER   amLODipine (NORVASC)   atorvastatin (LIPITOR)  nitroGLYCERIN (NITROSTAT)- If needed   Please follow your surgeon's instructions regarding Aspirin and clopidogrel (PLAVIX). If you have not received instructions then you need to contact your surgeon's office for instructions.   As of today, STOP taking any Aspirin (unless otherwise instructed by your surgeon) Aleve, Naproxen, Ibuprofen, Motrin, Advil, Goody's, BC's, all herbal medications, fish oil, and all vitamins.          Do not wear jewelry or makeup Do not wear lotions, powders, perfumes/colognes, or deodorant. Do not shave 48 hours prior to surgery.  Men may shave face and neck. Do not bring valuables to the hospital. DO Not wear nail polish, gel polish, artificial nails, or any other type of covering on natural nails including finger and toenails. If patients have artificial nails, gel coating, etc. that need to be removed by a nail salon please have this removed prior to surgery or surgery may need to be canceled/delayed if the surgeon/ anesthesia feels like the patient is unable to be adequately monitored.             [facility] is not responsible for any belongings or valuables.  Do NOT Smoke (Tobacco/Vaping)  24 hours prior to your procedure If you use a CPAP at night, you may bring your mask for your overnight stay.   Contacts, glasses, dentures or bridgework may not be worn into surgery, please bring cases for these  belongings   For patients admitted to the hospital, discharge time will be determined by your treatment team.   Patients discharged the day of surgery will not be allowed to drive home, and someone needs to stay with them for 24 hours.  NO VISITORS WILL BE ALLOWED IN PRE-OP WHERE PATIENTS GET READY FOR SURGERY.  ONLY 1 SUPPORT PERSON MAY BE PRESENT WHILE YOU ARE IN SURGERY.  IF YOU ARE TO BE ADMITTED, ONCE YOU ARE IN YOUR ROOM YOU WILL BE ALLOWED TWO (2) VISITORS.  Minor children may have two parents present. Special consideration for safety and communication needs will be reviewed on a case by case basis.  Special instructions:    Oral Hygiene is also important to reduce your risk of infection.  Remember - BRUSH YOUR TEETH THE MORNING OF SURGERY WITH YOUR REGULAR TOOTHPASTE   [facility] [place]- Preparing For Surgery  Before surgery, you can play an important role. Because skin is not sterile, your skin needs to be as free of germs as possible. You can reduce the number of germs on your skin by washing with CHG (chlorahexidine gluconate) Soap before surgery.  CHG is an antiseptic cleaner which kills germs and bonds with the skin to continue killing germs even after washing.     Please do not use if you have an allergy to CHG or antibacterial soaps. If your skin becomes reddened/irritated stop using the CHG.  Do not shave (including legs and underarms) for  at least 48 hours prior to first CHG shower. It is OK to shave your face.  Please follow these instructions carefully.     Shower the NIGHT BEFORE SURGERY and the MORNING OF SURGERY with CHG Soap.   If you chose to wash your hair, wash your hair first as usual with your normal shampoo. After you shampoo, rinse your hair and body thoroughly to remove the shampoo.  Then [employer] and genitals (private parts) with your normal soap and rinse thoroughly to remove soap.  After that Use CHG Soap as you would any other liquid soap. You can apply CHG  directly to the skin and wash gently with a scrungie or a clean washcloth.   Apply the CHG Soap to your body ONLY FROM THE NECK DOWN.  Do not use on open wounds or open sores. Avoid contact with your eyes, ears, mouth and genitals (private parts). Wash Face and genitals (private parts)  with your normal soap.   Wash thoroughly, paying special attention to the area where your surgery will be performed.  Thoroughly rinse your body with warm water from the neck down.  DO NOT shower/wash with your normal soap after using and rinsing off the CHG Soap.  Pat yourself dry with a CLEAN TOWEL.  Wear CLEAN PAJAMAS to bed the night before surgery  Place CLEAN SHEETS on your bed the night before your surgery  DO NOT SLEEP WITH PETS.   Day of Surgery:  Take a shower with CHG soap. Wear Clean/Comfortable clothing the morning of surgery Do not apply any deodorants/lotions.   Remember to brush your teeth WITH YOUR REGULAR TOOTHPASTE.   Please read over the following fact sheets that you were given.

## 2020-10-14 ENCOUNTER — Inpatient Hospital Stay (HOSPITAL_COMMUNITY)
Admission: RE | Admit: 2020-10-14 | Discharge: 2020-10-14 | Disposition: A | Discharge status: Home / Self Care | Payer: BC Managed Care – PPO | Source: Ambulatory Visit

## 2020-10-14 NOTE — Progress Notes (Signed)
Patient= No show for PAT appointment at 11 am on 10/14/20. Left voicemail for patient to call to reschedule missed appointment.

## 2020-10-16 IMAGING — CR DG PORTABLE PELVIS
1 series · 1 of 1 positions shown · non-contrast
Comparison: Fluoroscopy earlier today

CLINICAL DATA: Left hip replacement

EXAM:
PORTABLE PELVIS 1-2 VIEWS

[[person_name] view]
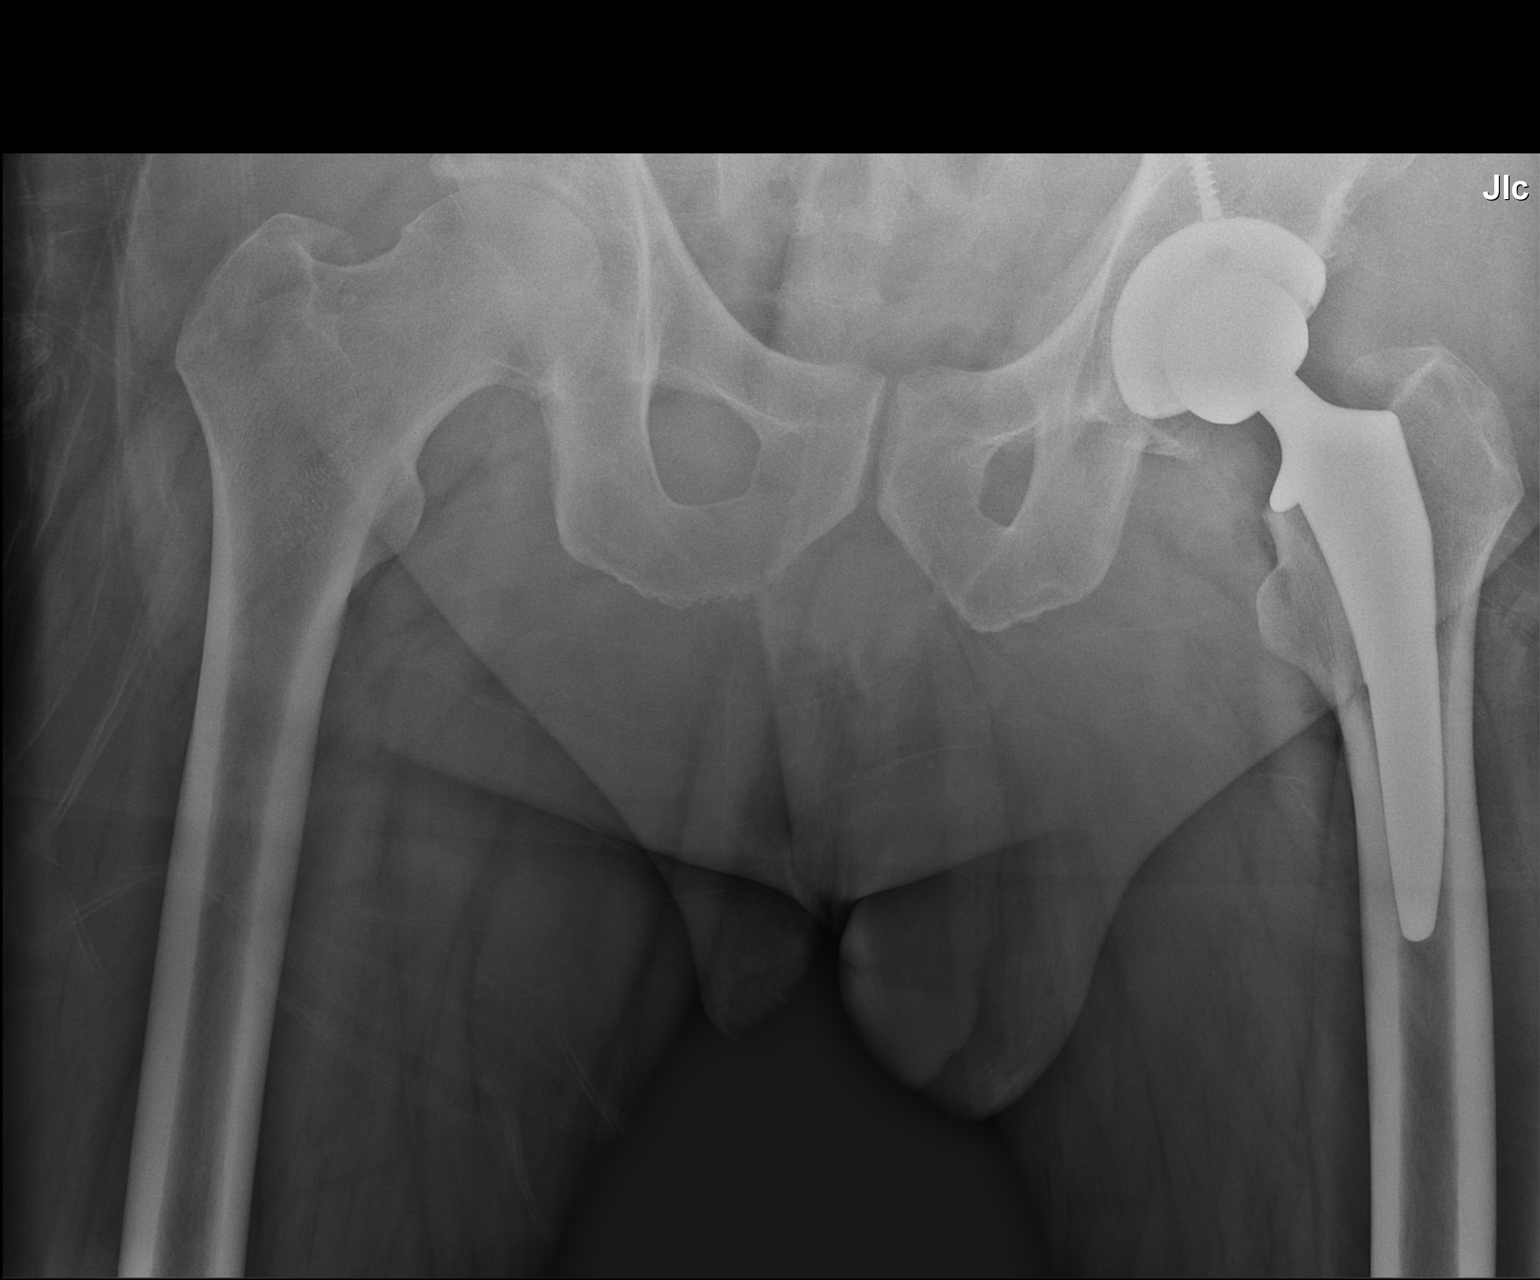

[1 of 1 positions shown; findings below may reference images not displayed]

FINDINGS: Total left hip arthroplasty. No evidence of periprosthetic fracture
when accounting for a pre-existing spur from the inferior
acetabulum, accentuated due to leftward rotation. The prosthesis is
located.
IMPRESSION: Total left hip arthroplasty without complicating feature.

## 2020-10-16 IMAGING — RF DG C-ARM 1-60 MIN
1 series · 4 of 4 positions shown · non-contrast
Comparison: 09/20/2018.

CLINICAL DATA: Left hip arthroplasty.

EXAM:
OPERATIVE left HIP (WITH PELVIS IF PERFORMED) 4 VIEWS
TECHNIQUE: Fluoroscopic spot image(s) were submitted for interpretation
post-operatively.

[Series 1: unknown protocol · 0.20mm/px · 4 of 4 slices shown]
[im 1/4]
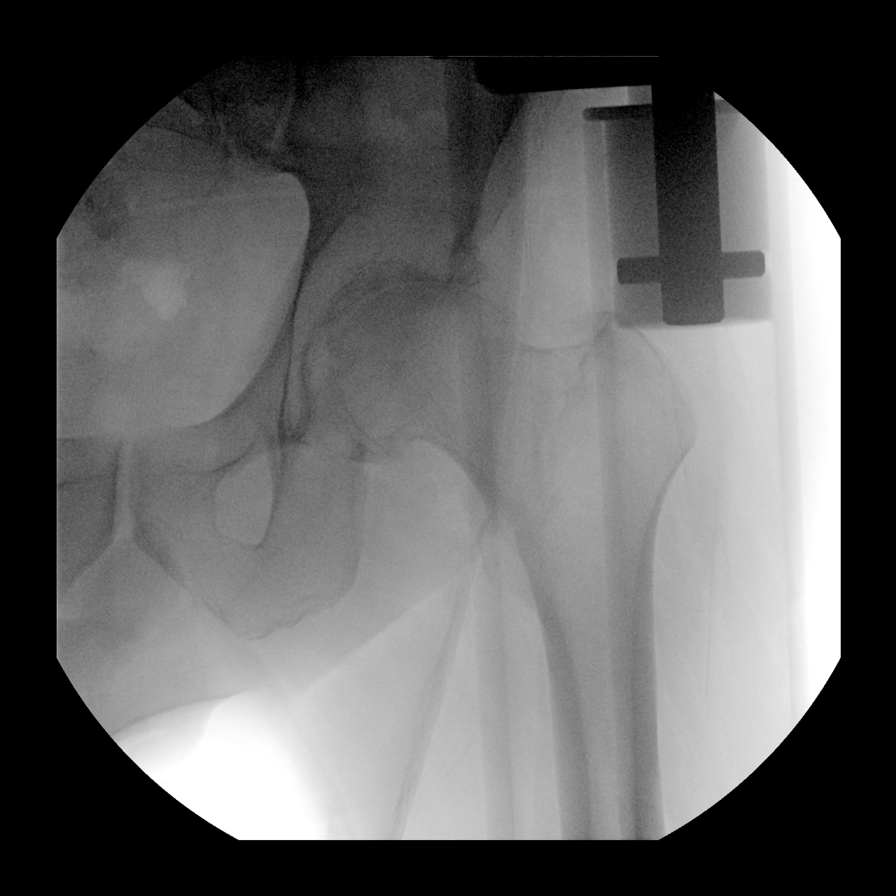
[im 2/4]
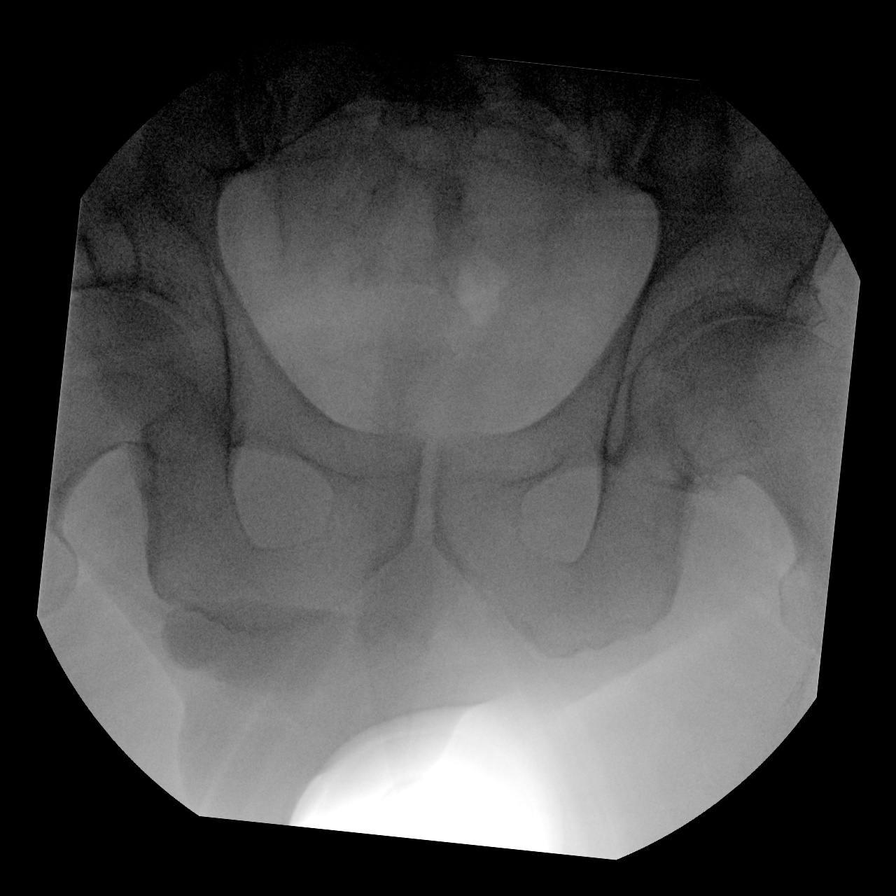
[im 3/4]
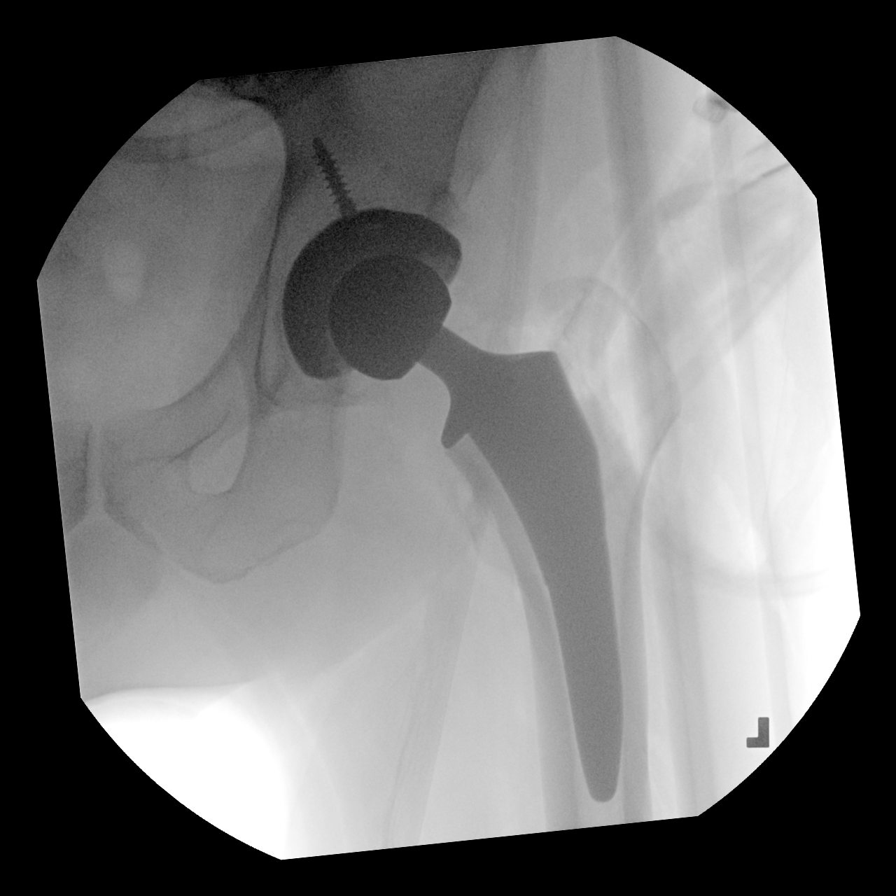
[im 4/4]
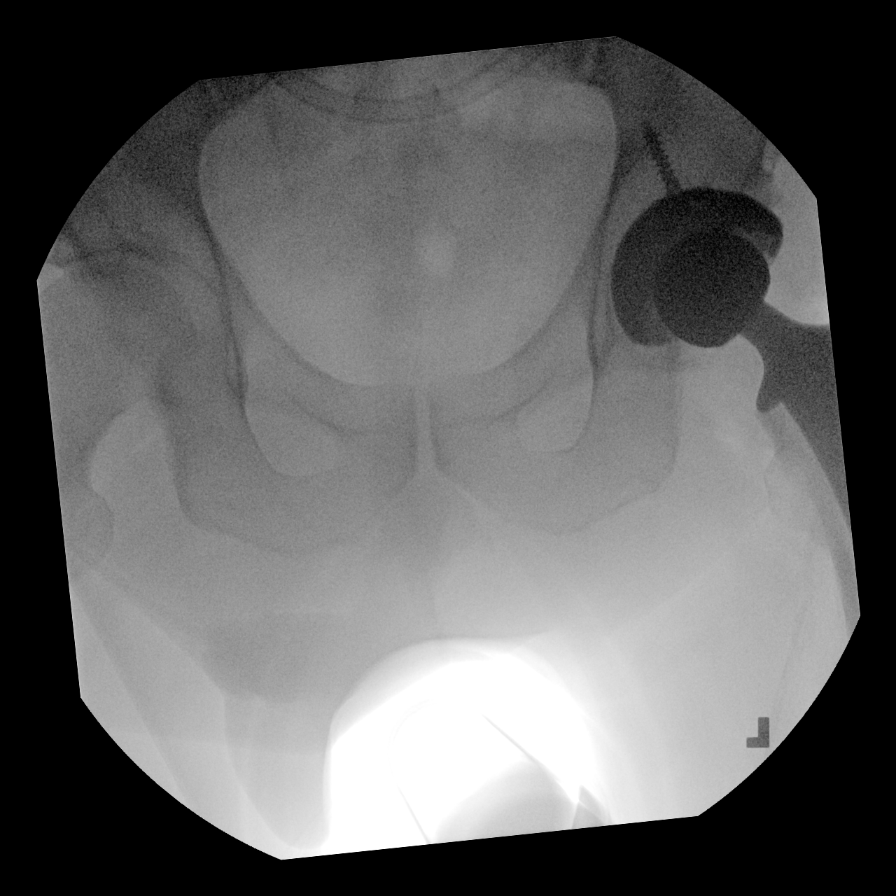

[4 of 4 positions shown; findings below may reference images not displayed]

FINDINGS: 4 fluoroscopic spot views of the left hip show placement of a left
total hip arthroplasty without complicating feature.
IMPRESSION: Left total hip arthroplasty without complicating feature.

## 2020-10-17 ENCOUNTER — Encounter (HOSPITAL_COMMUNITY): Payer: Self-pay

## 2020-10-17 ENCOUNTER — Other Ambulatory Visit: Payer: Self-pay

## 2020-10-17 ENCOUNTER — Encounter (HOSPITAL_COMMUNITY)
Admission: RE | Admit: 2020-10-17 | Discharge: 2020-10-17 | Disposition: A | Discharge status: Home / Self Care | Payer: BC Managed Care – PPO | Source: Ambulatory Visit | Attending: Surgery | Admitting: Surgery

## 2020-10-17 DIAGNOSIS — Z6839 Body mass index (BMI) 39.0-39.9, adult: Secondary | ICD-10-CM | POA: Diagnosis not present

## 2020-10-17 DIAGNOSIS — Z9989 Dependence on other enabling machines and devices: Secondary | ICD-10-CM | POA: Insufficient documentation

## 2020-10-17 DIAGNOSIS — I10 Essential (primary) hypertension: Secondary | ICD-10-CM | POA: Insufficient documentation

## 2020-10-17 DIAGNOSIS — Z7902 Long term (current) use of antithrombotics/antiplatelets: Secondary | ICD-10-CM | POA: Insufficient documentation

## 2020-10-17 DIAGNOSIS — I252 Old myocardial infarction: Secondary | ICD-10-CM | POA: Diagnosis not present

## 2020-10-17 DIAGNOSIS — Z955 Presence of coronary angioplasty implant and graft: Secondary | ICD-10-CM | POA: Diagnosis not present

## 2020-10-17 DIAGNOSIS — I2510 Atherosclerotic heart disease of native coronary artery without angina pectoris: Secondary | ICD-10-CM | POA: Insufficient documentation

## 2020-10-17 DIAGNOSIS — Z96642 Presence of left artificial hip joint: Secondary | ICD-10-CM | POA: Insufficient documentation

## 2020-10-17 DIAGNOSIS — E669 Obesity, unspecified: Secondary | ICD-10-CM | POA: Insufficient documentation

## 2020-10-17 DIAGNOSIS — K429 Umbilical hernia without obstruction or gangrene: Secondary | ICD-10-CM | POA: Diagnosis not present

## 2020-10-17 DIAGNOSIS — G4733 Obstructive sleep apnea (adult) (pediatric): Secondary | ICD-10-CM | POA: Diagnosis not present

## 2020-10-17 DIAGNOSIS — Z7982 Long term (current) use of aspirin: Secondary | ICD-10-CM | POA: Insufficient documentation

## 2020-10-17 DIAGNOSIS — Z01812 Encounter for preprocedural laboratory examination: Secondary | ICD-10-CM | POA: Diagnosis not present

## 2020-10-17 DIAGNOSIS — Z79899 Other long term (current) drug therapy: Secondary | ICD-10-CM | POA: Insufficient documentation

## 2020-10-17 LAB — BASIC METABOLIC PANEL
Anion gap: 6 (ref 5–15)
BUN: 9 mg/dL (ref 8–23)
CO2: 26 mmol/L (ref 22–32)
Calcium: 9.2 mg/dL (ref 8.9–10.3)
Chloride: 106 mmol/L (ref 98–111)
Creatinine, Ser: 0.99 mg/dL (ref 0.61–1.24)
GFR, Estimated: >60 mL/min (ref >60)
Glucose, Bld: 107 mg/dL — ABNORMAL HIGH (ref 70–99)
Potassium: 4.2 mmol/L (ref 3.5–5.1)
Sodium: 138 mmol/L (ref 135–145)

## 2020-10-17 LAB — CBC
HCT: 46.8 % (ref 39.0–52.0)
Hemoglobin: 15.3 g/dL (ref 13.0–17.0)
MCH: 30.2 pg (ref 26.0–34.0)
MCHC: 32.7 g/dL (ref 30.0–36.0)
MCV: 92.5 fL (ref 80.0–100.0)
Platelets: 222 10*3/uL (ref 150–400)
RBC: 5.06 MIL/uL (ref 4.22–5.81)
RDW: 13.1 % (ref 11.5–15.5)
WBC: 6.5 10*3/uL (ref 4.0–10.5)
nRBC: 0 % (ref 0.0–0.2)

## 2020-10-17 NOTE — Progress Notes (Signed)
PCP - Ricky Stabs, NP Cardiologist - Dr. Gypsy Balsam  Chest x-ray - n/a EKG - 07/02/20 Stress Test - 12/12/18 ECHO - 07/25/18 Cardiac Cath -2014   Sleep Study - OSA+ CPAP - uses nightly; pressure settings titrate from 5-13  Blood Thinner Instructions: Plavix, hold 5 days pre-op. LD 9/29 Aspirin Instructions: LD 9/29   COVID TEST- Not indicated. Ambulatory surgery.  Anesthesia review: Yes, cardiac history with stent intervention. Cardiac clearance.  Patient denies shortness of breath, fever, cough and chest pain at PAT appointment   All instructions explained to the patient, with a verbal understanding of the material. Patient agrees to go over the instructions while at home for a better understanding. Patient also instructed to self quarantine after being tested for COVID-19. The opportunity to ask questions was provided.

## 2020-10-20 NOTE — Anesthesia Preprocedure Evaluation (Addendum)
Anesthesia Evaluation  Patient identified by MRN, date of birth, ID band Patient awake    Reviewed: Allergy & Precautions, NPO status , Patient's Chart, lab work & pertinent test results  History of Anesthesia Complications Negative for: history of anesthetic complications  Airway Mallampati: III  TM Distance: >3 FB Neck ROM: Full    Dental  (+) Dental Advisory Given, Implants   Pulmonary sleep apnea and Continuous Positive Airway Pressure Ventilation , former smoker,    Pulmonary exam normal        Cardiovascular hypertension, Pt. on medications + CAD, + Past MI and + Cardiac Stents  Normal cardiovascular exam   '20 TTE - The left ventricular ejection fraction is hyperdynamic (>65%). Nuclear stress EF: 68%. There was no ST segment deviation noted during stress. No T wave inversion was noted during stress. The study is normal. This is a low risk study.    Neuro/Psych negative neurological ROS  negative psych ROS   GI/Hepatic negative GI ROS, Neg liver ROS,   Endo/Other  Morbid obesity Pre-DM   Renal/GU negative Renal ROS     Musculoskeletal  (+) Arthritis , Osteoarthritis,    Abdominal (+) + obese,   Peds  Hematology  On plavix    Anesthesia Other Findings   Reproductive/Obstetrics                          Anesthesia Physical Anesthesia Plan  ASA: 3  Anesthesia Plan: General   Post-op Pain Management:    Induction: Intravenous  PONV Risk Score and Plan: 3 and Treatment may vary due to age or medical condition, Ondansetron, Dexamethasone and Midazolam  Airway Management Planned: Oral ETT  Additional Equipment: None  Intra-op Plan:   Post-operative Plan: Extubation in OR  Informed Consent: I have reviewed the patients History and Physical, chart, labs and discussed the procedure including the risks, benefits and alternatives for the proposed anesthesia with the patient or  authorized representative who has indicated his/her understanding and acceptance.     Dental advisory given  Plan Discussed with: CRNA, Anesthesiologist and Surgeon  Anesthesia Plan Comments: (Lengthy discussion had regarding listed cefazolin allergy. Patient denies knowledge of any reaction to this medication. His penicillin allergy is non-anaphylactic in nature. Cefazolin allergy noted to be listed as a reaction on 01/15/19 when patient was having a knee replacement, but he received clindamycin for that procedure. Discussed with surgeon, will give cefazolin as antibiotic prophylaxis for this procedure as she requested and if confirmed that patient has no signs of allergic reaction, will remove the allergy from his chart. )    Anesthesia Quick Evaluation

## 2020-10-20 NOTE — Progress Notes (Signed)
Case: [id number] Date/Time: 10/22/20 1015   Procedure: OPEN UMBILICAL HERNIA REPAIR WITH MESH   Anesthesia type: General   Pre-op diagnosis: UMBILCAL HERNIA   [facility] / [facility]   Surgeons: Fritzi Mandes, MD      DISCUSSION: Mr. Villamar a is 60 yo male with a PMHx of CAD s/p PCI of LAD with DES, HTN, OSA on CPAP, umbilical hernia and obesity. He is scheduled for the above procedure on 10/22/20.   Last seen by Dr. Bing Matter on 06/2020 and was doing well. Cardiac clearance note per Ronie Spies, PA-C on 09/16/20. "The patient affirms he has been doing well without any new cardiac symptoms. Able to exert over 4 METS with exercise without any cardiac symptoms. Therefore, based on ACC/AHA guidelines, the patient would be at acceptable risk for the planned procedure without further cardiovascular testing. The patient was advised that if he develops new symptoms prior to surgery to contact our office to arrange for a follow-up visit, and he verbalized understanding.   I reached out to Dr. Bing Matter to clarify duration of holding Plavix as note below said hold Plavix after 7 days. He clarified that patient may hold Plavix for 5 days prior to procedure as requested. We typically advise that blood thinners be resumed when felt safe by performing physician."   VS: BP (!) 148/83   Pulse 76   Temp 36.8 C (Oral)   Resp 19   Ht 5\' 11"  (1.803 m)   Wt 128.5 kg   SpO2 98%   BMI 39.50 kg/m   PROVIDERS: , NP Dr. Rema Fendt, Neurologist for OSA Dr. Vickey Huger, Cardiologist  LABS: Labs reviewed: Acceptable for surgery. (all labs ordered are listed, but only abnormal results are displayed)  Labs Reviewed  BASIC METABOLIC PANEL - Abnormal; Notable for the following components:      Result Value   Glucose, Bld 107 (*)    All other components within normal limits  CBC   EKG: 07/02/20: NSR, 70 bpm  CV:  Myocardial Perfusion Stress Test (12/12/18) The left ventricular ejection  fraction is hyperdynamic (>65%). Nuclear stress EF: 68%. There was no ST segment deviation noted during stress. No T wave inversion was noted during stress. The study is normal. This is a low risk study.  ECHO (07/25/18) IMPRESSIONS    1. The left ventricle has normal systolic function with an ejection  fraction of 60-65%. The cavity size was normal. There is mildly increased  left ventricular wall thickness. Left ventricular diastolic Doppler  parameters are consistent with impaired  relaxation.   2. The right ventricle has normal systolic function. The cavity was  normal. There is no increase in right ventricular wall thickness.   Past Medical History:  Diagnosis Date   Acute myocardial infarction of other anterior wall, initial episode of care    CAD- LAD DES in setting of NSTEMI Sept 2014 04/06/2013   Coronary artery disease    s/P PCI of the LAD with DES.  Nuclear stress test 04/2014 with no ischemia   Displaced fracture of proximal phalanx of left little finger with routine healing 08/01/2018   Encounter for long-term (current) use of other medications 04/06/2013   Hypercholesteremia    Hyperlipidemia    Mixed hyperlipidemia 04/06/2013   Morbid obesity due to excess calories (HCC) 05/26/2015   Nondisplaced fracture of proximal phalanx of left great toe, initial encounter for closed fracture 08/01/2018   Obesity, unspecified 10/08/2012   Old MI (  myocardial infarction) 04/06/2013   OSA on CPAP    followed by Dr. Jessie Foot   Personal history of urinary calculi    Primary osteoarthritis of left hip 09/20/2018   Primary osteoarthritis of right hip 11/21/2018   Status post total hip replacement, left 04/10/2019   Status post total replacement of left hip 01/15/2019   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     Past Surgical History:  Procedure Laterality Date   INGUINAL HERNIA REPAIR Right 03/08/2012   Procedure: HERNIA REPAIR INGUINAL ADULT;  Surgeon: Axel Filler, MD;  Location: [facility]  OR;  Service: General;  Laterality: Right;   INSERTION OF MESH N/A 03/08/2012   Procedure: INSERTION OF MESH;  Surgeon: Axel Filler, MD;  [facility];  Service: General;  Laterality: N/A;   LEFT HEART CATHETERIZATION WITH CORONARY ANGIOGRAM Right 10/07/2012   Procedure: LEFT HEART CATHETERIZATION WITH CORONARY ANGIOGRAM;  Surgeon: Corky Crafts, MD;  Location: [facility] CATH LAB;  Service: Cardiovascular;  Laterality: Right;   METACARPAL OSTEOTOMY Left 11/01/2018   Procedure: Left small finger osteophyte removal;  Surgeon: Tarry Kos, MD;  Location: [facility];  Service: Orthopedics;  Laterality: Left;   PERCUTANEOUS STENT INTERVENTION  10/07/2012   Procedure: PERCUTANEOUS STENT INTERVENTION;  Surgeon: Corky Crafts, MD;  Location: [facility] CATH LAB;  Service: Cardiovascular;;   TOTAL HIP ARTHROPLASTY Left 01/15/2019   Procedure: LEFT TOTAL HIP ARTHROPLASTY ANTERIOR APPROACH;  Surgeon: Tarry Kos, MD;  [facility];  Service: Orthopedics;  Laterality: Left;    MEDICATIONS:  amLODipine (NORVASC) 5 MG tablet   ascorbic acid (VITAMIN C) 500 MG tablet   aspirin EC 81 MG tablet   atorvastatin (LIPITOR) 80 MG tablet   clopidogrel (PLAVIX) 75 MG tablet   nitroGLYCERIN (NITROSTAT) 0.4 MG SL tablet   zinc gluconate 50 MG tablet   No current facility-administered medications for this encounter.   Ulla Potash, DNP, CRNA, NP-C Short Stay/Anesthesia

## 2020-10-22 ENCOUNTER — Ambulatory Visit (HOSPITAL_COMMUNITY): Payer: BC Managed Care – PPO | Admitting: Anesthesiology

## 2020-10-22 ENCOUNTER — Encounter (HOSPITAL_COMMUNITY)
Admission: RE | Disposition: A | Discharge status: Home / Self Care | Payer: Self-pay | Source: Home / Self Care | Attending: Surgery

## 2020-10-22 ENCOUNTER — Encounter (HOSPITAL_COMMUNITY): Payer: Self-pay | Admitting: Surgery

## 2020-10-22 ENCOUNTER — Ambulatory Visit (HOSPITAL_COMMUNITY)
Admission: RE | Admit: 2020-10-22 | Discharge: 2020-10-22 | Disposition: A | Discharge status: Home / Self Care | Payer: BC Managed Care – PPO | Attending: Surgery | Admitting: Surgery

## 2020-10-22 DIAGNOSIS — G4733 Obstructive sleep apnea (adult) (pediatric): Secondary | ICD-10-CM | POA: Insufficient documentation

## 2020-10-22 DIAGNOSIS — M160 Bilateral primary osteoarthritis of hip: Secondary | ICD-10-CM | POA: Insufficient documentation

## 2020-10-22 DIAGNOSIS — Z955 Presence of coronary angioplasty implant and graft: Secondary | ICD-10-CM | POA: Insufficient documentation

## 2020-10-22 DIAGNOSIS — Z7982 Long term (current) use of aspirin: Secondary | ICD-10-CM | POA: Diagnosis not present

## 2020-10-22 DIAGNOSIS — Z87891 Personal history of nicotine dependence: Secondary | ICD-10-CM | POA: Diagnosis not present

## 2020-10-22 DIAGNOSIS — K429 Umbilical hernia without obstruction or gangrene: Secondary | ICD-10-CM | POA: Diagnosis not present

## 2020-10-22 DIAGNOSIS — Z87442 Personal history of urinary calculi: Secondary | ICD-10-CM | POA: Diagnosis not present

## 2020-10-22 DIAGNOSIS — Z8249 Family history of ischemic heart disease and other diseases of the circulatory system: Secondary | ICD-10-CM | POA: Insufficient documentation

## 2020-10-22 DIAGNOSIS — Z880 Allergy status to penicillin: Secondary | ICD-10-CM | POA: Diagnosis not present

## 2020-10-22 DIAGNOSIS — I10 Essential (primary) hypertension: Secondary | ICD-10-CM | POA: Insufficient documentation

## 2020-10-22 DIAGNOSIS — I2510 Atherosclerotic heart disease of native coronary artery without angina pectoris: Secondary | ICD-10-CM | POA: Diagnosis not present

## 2020-10-22 DIAGNOSIS — R7303 Prediabetes: Secondary | ICD-10-CM | POA: Insufficient documentation

## 2020-10-22 DIAGNOSIS — Z6839 Body mass index (BMI) 39.0-39.9, adult: Secondary | ICD-10-CM | POA: Diagnosis not present

## 2020-10-22 DIAGNOSIS — I252 Old myocardial infarction: Secondary | ICD-10-CM | POA: Diagnosis not present

## 2020-10-22 DIAGNOSIS — E782 Mixed hyperlipidemia: Secondary | ICD-10-CM | POA: Diagnosis not present

## 2020-10-22 DIAGNOSIS — Z881 Allergy status to other antibiotic agents status: Secondary | ICD-10-CM | POA: Insufficient documentation

## 2020-10-22 DIAGNOSIS — Z79899 Other long term (current) drug therapy: Secondary | ICD-10-CM | POA: Insufficient documentation

## 2020-10-22 DIAGNOSIS — M1990 Unspecified osteoarthritis, unspecified site: Secondary | ICD-10-CM | POA: Diagnosis not present

## 2020-10-22 DIAGNOSIS — K420 Umbilical hernia with obstruction, without gangrene: Secondary | ICD-10-CM | POA: Insufficient documentation

## 2020-10-22 DIAGNOSIS — Z7902 Long term (current) use of antithrombotics/antiplatelets: Secondary | ICD-10-CM | POA: Diagnosis not present

## 2020-10-22 HISTORY — PX: INSERTION OF MESH: SHX5868

## 2020-10-22 HISTORY — PX: UMBILICAL HERNIA REPAIR: SHX196

## 2020-10-22 SURGERY — REPAIR, HERNIA, UMBILICAL, ADULT
Anesthesia: General | Site: Abdomen

## 2020-10-22 MED ORDER — DEXAMETHASONE SODIUM PHOSPHATE 10 MG/ML IJ SOLN
INTRAMUSCULAR | Status: AC
  Filled 2020-10-22: qty 1

## 2020-10-22 MED ORDER — CHLORHEXIDINE GLUCONATE CLOTH 2 % EX PADS: 6.0000 | MEDICATED_PAD | Freq: Once | CUTANEOUS | Status: DC

## 2020-10-22 MED ORDER — CEFAZOLIN SODIUM-DEXTROSE 2-4 GM/100ML-% IV SOLN
INTRAVENOUS | Status: AC
  Filled 2020-10-22: qty 100

## 2020-10-22 MED ORDER — HYDROCODONE-ACETAMINOPHEN 5-325 MG PO TABS: 1.0000 | ORAL_TABLET | Freq: Four times a day (QID) | ORAL | 0 refills | Status: DC | PRN

## 2020-10-22 MED ORDER — OXYCODONE HCL 5 MG/5ML PO SOLN: 5.0000 mg | Freq: Once | ORAL | Status: DC | PRN

## 2020-10-22 MED ORDER — PROPOFOL 10 MG/ML IV BOLUS
INTRAVENOUS | Status: AC
  Filled 2020-10-22: qty 20

## 2020-10-22 MED ORDER — LIDOCAINE 2% (20 MG/ML) 5 ML SYRINGE
INTRAMUSCULAR | Status: DC | PRN
Start: 2020-10-22 — End: 2020-10-22
  Administered 2020-10-22: 40 mg via INTRAVENOUS
  Administered 2020-10-22: 60 mg via INTRAVENOUS

## 2020-10-22 MED ORDER — ROCURONIUM BROMIDE 10 MG/ML (PF) SYRINGE
PREFILLED_SYRINGE | INTRAVENOUS | Status: AC
  Filled 2020-10-22: qty 10

## 2020-10-22 MED ORDER — SUGAMMADEX SODIUM 500 MG/5ML IV SOLN
INTRAVENOUS | Status: AC
  Filled 2020-10-22: qty 5

## 2020-10-22 MED ORDER — LIDOCAINE 2% (20 MG/ML) 5 ML SYRINGE
INTRAMUSCULAR | Status: AC
  Filled 2020-10-22: qty 5

## 2020-10-22 MED ORDER — BUPIVACAINE-EPINEPHRINE 0.25% -1:200000 IJ SOLN
INTRAMUSCULAR | Status: DC | PRN
  Administered 2020-10-22: 30 mL

## 2020-10-22 MED ORDER — FENTANYL CITRATE (PF) 100 MCG/2ML IJ SOLN: 25.0000 ug | INTRAMUSCULAR | Status: DC | PRN

## 2020-10-22 MED ORDER — ONDANSETRON HCL 4 MG/2ML IJ SOLN
INTRAMUSCULAR | Status: DC | PRN
  Administered 2020-10-22: 4 mg via INTRAVENOUS

## 2020-10-22 MED ORDER — 0.9 % SODIUM CHLORIDE (POUR BTL) OPTIME
TOPICAL | Status: DC | PRN
  Administered 2020-10-22: 1000 mL

## 2020-10-22 MED ORDER — PROMETHAZINE HCL 25 MG/ML IJ SOLN: 6.2500 mg | INTRAMUSCULAR | Status: DC | PRN

## 2020-10-22 MED ORDER — CEFAZOLIN SODIUM-DEXTROSE 2-3 GM-%(50ML) IV SOLR
INTRAVENOUS | Status: DC | PRN
Start: 2020-10-22 — End: 2020-10-22

## 2020-10-22 MED ORDER — PROPOFOL 10 MG/ML IV BOLUS
INTRAVENOUS | Status: DC | PRN
  Administered 2020-10-22: 200 mg via INTRAVENOUS

## 2020-10-22 MED ORDER — FENTANYL CITRATE (PF) 250 MCG/5ML IJ SOLN
INTRAMUSCULAR | Status: AC
  Filled 2020-10-22: qty 5

## 2020-10-22 MED ORDER — LACTATED RINGERS IV SOLN
INTRAVENOUS | Status: DC
Start: 2020-10-22 — End: 2020-10-22

## 2020-10-22 MED ORDER — BUPIVACAINE-EPINEPHRINE (PF) 0.25% -1:200000 IJ SOLN
INTRAMUSCULAR | Status: AC
  Filled 2020-10-22: qty 30

## 2020-10-22 MED ORDER — DEXAMETHASONE SODIUM PHOSPHATE 10 MG/ML IJ SOLN
INTRAMUSCULAR | Status: DC | PRN
  Administered 2020-10-22: 8 mg via INTRAVENOUS

## 2020-10-22 MED ORDER — CEFAZOLIN SODIUM-DEXTROSE 2-4 GM/100ML-% IV SOLN
2.0000 g | INTRAVENOUS | Status: AC
  Administered 2020-10-22: 2 g via INTRAVENOUS
  Filled 2020-10-22: qty 100

## 2020-10-22 MED ORDER — GABAPENTIN 300 MG PO CAPS
300.0000 mg | ORAL_CAPSULE | ORAL | Status: AC
  Administered 2020-10-22: 300 mg via ORAL
  Filled 2020-10-22: qty 1

## 2020-10-22 MED ORDER — ONDANSETRON HCL 4 MG/2ML IJ SOLN
INTRAMUSCULAR | Status: AC
  Filled 2020-10-22: qty 2

## 2020-10-22 MED ORDER — FENTANYL CITRATE (PF) 100 MCG/2ML IJ SOLN
INTRAMUSCULAR | Status: DC | PRN
  Administered 2020-10-22: 100 ug via INTRAVENOUS
  Administered 2020-10-22: 50 ug via INTRAVENOUS
  Administered 2020-10-22: 100 ug via INTRAVENOUS

## 2020-10-22 MED ORDER — MIDAZOLAM HCL 2 MG/2ML IJ SOLN
INTRAMUSCULAR | Status: DC | PRN
  Administered 2020-10-22: 2 mg via INTRAVENOUS

## 2020-10-22 MED ORDER — ORAL CARE MOUTH RINSE: 15.0000 mL | Freq: Once | OROMUCOSAL | Status: AC

## 2020-10-22 MED ORDER — OXYCODONE HCL 5 MG PO TABS
5.0000 mg | ORAL_TABLET | Freq: Once | ORAL | Status: DC | PRN
Start: 2020-10-22 — End: 2020-10-22

## 2020-10-22 MED ORDER — CHLORHEXIDINE GLUCONATE 0.12 % MT SOLN
15.0000 mL | Freq: Once | OROMUCOSAL | Status: AC
  Administered 2020-10-22: 15 mL via OROMUCOSAL
  Filled 2020-10-22: qty 15

## 2020-10-22 MED ORDER — ACETAMINOPHEN 500 MG PO TABS
1000.0000 mg | ORAL_TABLET | ORAL | Status: AC
  Administered 2020-10-22: 1000 mg via ORAL
  Filled 2020-10-22: qty 2

## 2020-10-22 MED ORDER — ROCURONIUM BROMIDE 10 MG/ML (PF) SYRINGE
PREFILLED_SYRINGE | INTRAVENOUS | Status: DC | PRN
  Administered 2020-10-22: 50 mg via INTRAVENOUS
  Administered 2020-10-22: 20 mg via INTRAVENOUS

## 2020-10-22 MED ORDER — SUGAMMADEX SODIUM 200 MG/2ML IV SOLN
INTRAVENOUS | Status: DC | PRN
  Administered 2020-10-22: 300 mg via INTRAVENOUS

## 2020-10-22 MED ORDER — MIDAZOLAM HCL 2 MG/2ML IJ SOLN
INTRAMUSCULAR | Status: AC
  Filled 2020-10-22: qty 2

## 2020-10-22 SURGICAL SUPPLY — 38 items
ADH SKN CLS APL DERMABOND .7 (GAUZE/BANDAGES/DRESSINGS) ×1
APL PRP STRL LF DISP 70% ISPRP (MISCELLANEOUS) ×1
BAG COUNTER SPONGE SURGICOUNT (BAG) ×2 IMPLANT
BAG SPNG CNTER NS LX DISP (BAG) ×1
BLADE CLIPPER SURG (BLADE) IMPLANT
CANISTER SUCT 3000ML PPV (MISCELLANEOUS) IMPLANT
CHLORAPREP W/TINT 26 (MISCELLANEOUS) ×2 IMPLANT
COVER SURGICAL LIGHT HANDLE (MISCELLANEOUS) ×2 IMPLANT
DERMABOND ADVANCED (GAUZE/BANDAGES/DRESSINGS) ×1
DERMABOND ADVANCED .7 DNX12 (GAUZE/BANDAGES/DRESSINGS) ×1 IMPLANT
DRAPE LAPAROTOMY 100X72 PEDS (DRAPES) ×2 IMPLANT
DRSG TEGADERM 4X4.75 (GAUZE/BANDAGES/DRESSINGS) ×1 IMPLANT
ELECT CAUTERY BLADE 6.4 (BLADE) IMPLANT
ELECT REM PT RETURN 9FT ADLT (ELECTROSURGICAL) ×2
ELECTRODE REM PT RTRN 9FT ADLT (ELECTROSURGICAL) ×1 IMPLANT
GAUZE SPONGE 2X2 8PLY STRL LF (GAUZE/BANDAGES/DRESSINGS) IMPLANT
GLOVE SURG POLY MICRO LF SZ5.5 (GLOVE) ×2 IMPLANT
GLOVE SURG UNDER POLY LF SZ6 (GLOVE) ×2 IMPLANT
GOWN STRL REUS W/ TWL LRG LVL3 (GOWN DISPOSABLE) ×2 IMPLANT
GOWN STRL REUS W/TWL LRG LVL3 (GOWN DISPOSABLE) ×4
KIT BASIN OR (CUSTOM PROCEDURE TRAY) ×2 IMPLANT
KIT TURNOVER KIT B (KITS) ×2 IMPLANT
MESH VENTRALEX ST 2.5 CRC MED (Mesh General) ×1 IMPLANT
NDL HYPO 25GX1X1/2 BEV (NEEDLE) ×1 IMPLANT
NEEDLE HYPO 25GX1X1/2 BEV (NEEDLE) ×2 IMPLANT
NS IRRIG 1000ML POUR BTL (IV SOLUTION) ×2 IMPLANT
PACK GENERAL/GYN (CUSTOM PROCEDURE TRAY) ×2 IMPLANT
PAD ARMBOARD 7.5X6 YLW CONV (MISCELLANEOUS) ×2 IMPLANT
PENCIL SMOKE EVACUATOR (MISCELLANEOUS) ×2 IMPLANT
SPONGE GAUZE 2X2 STER 10/PKG (GAUZE/BANDAGES/DRESSINGS) ×1
SUT MNCRL AB 4-0 PS2 18 (SUTURE) ×2 IMPLANT
SUT NOVA NAB DX-16 0-1 5-0 T12 (SUTURE) ×3 IMPLANT
SUT SILK 3 0 TIES 10X30 (SUTURE) ×1 IMPLANT
SUT VIC AB 3-0 SH 27 (SUTURE) ×4
SUT VIC AB 3-0 SH 27X BRD (SUTURE) ×1 IMPLANT
SYR CONTROL 10ML LL (SYRINGE) ×2 IMPLANT
TOWEL GREEN STERILE (TOWEL DISPOSABLE) ×2 IMPLANT
TOWEL GREEN STERILE FF (TOWEL DISPOSABLE) ×2 IMPLANT

## 2020-10-22 NOTE — Discharge Instructions (Addendum)
[facility] DISCHARGE INSTRUCTIONS  Activity No heavy lifting greater than 15 pounds for 6 weeks after surgery. Ok to shower in 48 hours after surgery, but do not bathe or submerge incisions underwater. Do not drive while taking narcotic pain medication.  Wound Care There is a gauze dressing over your incision - you may remove this in 48 hours. After that is ok to shower over the incision. Your incision is covered with skin glue called Dermabond. This will peel off on its own over time. You may shower and allow warm soapy water to run over your incisions. Gently pat dry. Do not submerge your incision underwater. Monitor your incision for any new redness, tenderness, or drainage.  When to Call us: Fever greater than 100.5 New redness, drainage, or swelling at incision site Severe pain, nausea, or vomiting Jaundice (yellowing of the whites of the eyes or skin)  Follow-up You have an appointment scheduled with Dr. Freida Busman on November 10, 2020 at 10:30am. This will be at the [facility] office at [address], [place], [place]. Please arrive at least 15 minutes prior to your scheduled appointment time.  For questions or concerns, please call the office at [phone].  You may resume taking your Plavix 48 hours after surgery.

## 2020-10-22 NOTE — H&P (Addendum)
Wayne Jordan is an 60 y.o. male.   Chief Complaint: hernia HPI: Wayne Jordan is a 60 year old male who was referred with an umbilical hernia.  He has had an umbilical hernia for about 6-7 years, but it has recently become more symptomatic. He works out frequently and does weight-lifting, and reports that it sometimes turns red and firm while he is lifting. It is sometimes painful with activities but otherwise does not cause pain. He says the symptoms limit some of the exercises he is able to do. He does not have nausea/vomiting or any other obstructive symptoms. He presents today for surgery.  He has a history of OSA and is compliant with CPAP use at night. He had an NSTEMI in 2014 and remains on Plavix.  He was cleared by his cardiologist to hold Plavix for 5 days prior to surgery, which he has done.   Past Medical History:  Diagnosis Date   Acute myocardial infarction of other anterior wall, initial episode of care    CAD- LAD DES in setting of NSTEMI Sept 2014 04/06/2013   Coronary artery disease    s/P PCI of the LAD with DES.  Nuclear stress test 04/2014 with no ischemia   Displaced fracture of proximal phalanx of left little finger with routine healing 08/01/2018   Encounter for long-term (current) use of other medications 04/06/2013   Hypercholesteremia    Hyperlipidemia    Mixed hyperlipidemia 04/06/2013   Morbid obesity due to excess calories (HCC) 05/26/2015   Nondisplaced fracture of proximal phalanx of left great toe, initial encounter for closed fracture 08/01/2018   Obesity, unspecified 10/08/2012   Old MI (myocardial infarction) 04/06/2013   OSA on CPAP    followed by Dr. Jessie Foot   Personal history of urinary calculi    Primary osteoarthritis of left hip 09/20/2018   Primary osteoarthritis of right hip 11/21/2018   Status post total hip replacement, left 04/10/2019   Status post total replacement of left hip 01/15/2019   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     Past Surgical  History:  Procedure Laterality Date   INGUINAL HERNIA REPAIR Right 03/08/2012   Procedure: HERNIA REPAIR INGUINAL ADULT;  Surgeon: Axel Filler, MD;  Location[facility] OR;  Service: General;  Laterality: Right;   INSERTION OF MESH N/A 03/08/2012   Procedure: INSERTION OF MESH;  Surgeon: Axel Filler, MD;  [facility];  Service: General;  Laterality: N/A;   LEFT HEART CATHETERIZATION WITH CORONARY ANGIOGRAM Right 10/07/2012   Procedure: LEFT HEART CATHETERIZATION WITH CORONARY ANGIOGRAM;  Surgeon: Corky Crafts, MD;  Location: [facility] CATH LAB;  Service: Cardiovascular;  Laterality: Right;   METACARPAL OSTEOTOMY Left 11/01/2018   Procedure: Left small finger osteophyte removal;  Surgeon: Tarry Kos, MD;  Location: [facility];  Service: Orthopedics;  Laterality: Left;   PERCUTANEOUS STENT INTERVENTION  10/07/2012   Procedure: PERCUTANEOUS STENT INTERVENTION;  Surgeon: Corky Crafts, MD;  Location: [facility] CATH LAB;  Service: Cardiovascular;;   TOTAL HIP ARTHROPLASTY Left 01/15/2019   Procedure: LEFT TOTAL HIP ARTHROPLASTY ANTERIOR APPROACH;  Surgeon: Tarry Kos, MD;  [facility];  Service: Orthopedics;  Laterality: Left;    Family History  Problem Relation Age of Onset   Heart disease Father    Heart attack Father    CAD Father    Social History:  reports that he quit smoking about 8 years ago. His smoking use included cigarettes. He has a 10.50 pack-year smoking history. He has  never used smokeless tobacco. He reports that he does not currently use alcohol. He reports that he does not use drugs.  Allergies:  Allergies  Allergen Reactions   Coconut Fatty Acids Shortness Of Breath and Swelling   Penicillins Hives and Rash    Did it involve swelling of the face/tongue/throat, SOB, or low BP? Yes Did it involve sudden or severe rash/hives, skin peeling, or any reaction on the inside of your mouth or nose? No Did you need to seek medical attention at a hospital or  doctor's office? No When did it last happen?      unknown  If all above answers are "NO", may proceed with cephalosporin use.    Cefazolin     Allergic rxn noted by provider on 01/15/19 admission    Medications Prior to Admission  Medication Sig Dispense Refill   amLODipine (NORVASC) 5 MG tablet Take 1 tablet (5 mg total) by mouth daily. 90 tablet 1   ascorbic acid (VITAMIN C) 500 MG tablet Take 1,000 mg by mouth daily.     aspirin EC 81 MG tablet Take 81 mg by mouth daily. Swallow whole.     atorvastatin (LIPITOR) 80 MG tablet Take 1 tablet (80 mg total) by mouth daily. 90 tablet 1   clopidogrel (PLAVIX) 75 MG tablet TAKE 1 TABLET DAILY 90 tablet 1   nitroGLYCERIN (NITROSTAT) 0.4 MG SL tablet Place 1 tablet (0.4 mg total) under the tongue every 5 (five) minutes x 3 doses as needed for chest pain. 25 tablet 9   zinc gluconate 50 MG tablet Take 50 mg by mouth daily.      No results found for this or any previous visit (from the past 48 hour(s)). No results found.  Review of Systems  Constitutional:  Negative for chills and fever.  Respiratory:  Negative for shortness of breath and wheezing.   Cardiovascular:  Negative for chest pain.  Gastrointestinal:  Negative for abdominal pain, nausea and vomiting.  Neurological:  Negative for syncope and weakness.  Psychiatric/Behavioral:  Negative for agitation and confusion.    Blood pressure (!) 145/89, pulse 70, temperature (!) 97.5 F (36.4 C), resp. rate 18, height 5\' 11"  (1.803 m), weight 128.8 kg, SpO2 96 %. Physical Exam Vitals reviewed.  Constitutional:      General: He is not in acute distress.    Appearance: Normal appearance.  HENT:     Head: Normocephalic and atraumatic.  Eyes:     General: No scleral icterus.    Conjunctiva/sclera: Conjunctivae normal.  Pulmonary:     Effort: Pulmonary effort is normal. No respiratory distress.  Abdominal:     General: There is no distension.     Palpations: Abdomen is soft.      Comments: Umbilical hernia with mild overlying skin erythema.  Hernia is nonreducible but soft and nontender.  Musculoskeletal:        General: Normal range of motion.     Cervical back: Normal range of motion.  Skin:    General: Skin is warm and dry.  Neurological:     General: No focal deficit present.     Mental Status: He is alert and oriented to person, place, and time.  Psychiatric:        Mood and Affect: Mood normal.        Behavior: Behavior normal.        Thought Content: Thought content normal.     Assessment/Plan This is a 60 year old male with a symptomatic umbilical  hernia, likely containing chronically incarcerated omentum.  Proceed to the OR for open umbilical hernia repair with mesh placement.  The procedure details were discussed with the patient and informed consent was obtained.  Plan for discharge home from PACU.  Patient has a documented penicillin allergy, but on questioning he says his reaction was a rash with itching.  He denies any symptoms of anaphylaxis.  Will give cefazolin for surgical prophylaxis.  Fritzi Mandes, MD 10/22/2020, 9:45 AM

## 2020-10-22 NOTE — Op Note (Signed)
Date: 10/22/20  Patient: Wayne Jordan MRN: [id number]  Preoperative Diagnosis: Umbilical hernia Postoperative Diagnosis: Same  Procedure: Open umbilical hernia repair with mesh underlay  Surgeon: Sophronia Simas, MD Assistant: Basilio Cairo, MD (Resident)  EBL: Minimal  Anesthesia: General endotracheal  Specimens: None  Indications: [name] is a 60 year old male who presented with an umbilical hernia.  He was having frequent symptoms, including pain and redness at the hernia site particularly with exercise and activities.  Due to the degree and frequency of symptoms, elective hernia repair was offered.  Findings: Umbilical hernia containing chronically incarcerated omental fat, with a 2 cm fascial defect.  Repaired with a 6 cm Ventralex mesh underlay.  Procedure details: Informed consent was obtained in the preoperative area prior to the procedure. The patient was brought to the operating room and placed on the table in the supine position. General anesthesia was induced and appropriate lines and drains were placed for intraoperative monitoring. Perioperative antibiotics were administered per SCIP guidelines. The abdomen was prepped and draped in the usual sterile fashion. A pre-procedure timeout was taken verifying patient identity, surgical site and procedure to be performed.  A curvilinear incision was made over the inferior aspect of the palpable hernia.  The subcutaneous tissue was divided with hot cautery and the hernia sac was encountered.  The overlying umbilical skin was taken off the hernia sac using cautery.  The hernia sac was then dissected out of the surrounding subcutaneous tissue down the fascia using blunt dissection and cautery.  The sac was opened and contained viable omental fat.  The sac was taken off the fascia circumferentially.  A large portion of the omentum was able to be reduced into the abdomen, but not all of it.  The nonreducible portion ligated with 2-0 silk  ties and amputated.  Remaining omentum was reduced.  The resulting hernia defect was measured at 2 cm in diameter.  The peritoneal surface was palpated and there were some surrounding omental adhesions, which were taken down bluntly.  There was no bowel adherent to the abdominal wall surrounding the hernia defect.  Subcutaneous flaps were then raised around the hernia defects circumferentially using cautery.  A circular piece of 6cm Ventralex mesh was brought onto the field.  It was tacked to the fascia in an underlay fashion using 1 Novafil sutures at 4 points on the edge of the mesh (superior, inferior, left, and right).  Prior to tying down the sutures, the peritoneal surface surrounding the defect was palpated to confirm that there was no omentum or small bowel between the mesh and the peritoneum. The sutures were then tied down to bring the mesh flat against the peritoneal surface.  The fascial defect was then closed over the mesh using interrupted 1 Novafil sutures.  The wound was irrigated and hemostasis was achieved with cautery.  The overlying umbilical skin was tacked down to the fascia using a 3-0 Vicryl suture.  The skin was reapproximated with a deep dermal layer of interrupted 3-0 Vicryl suture, and closed with a running subcuticular 4-0 Monocryl suture.  Dermabond was applied, followed by a sterile pressure dressing.  The patient tolerated the procedure with no apparent complications.  All counts were correct x2 at the end of the procedure. The patient was extubated and taken to PACU in stable condition.  Sophronia Simas, MD 10/22/20 3:09 PM

## 2020-10-22 NOTE — Transfer of Care (Signed)
Immediate Anesthesia Transfer of Care Note  Patient: Wayne Jordan  Procedure(s) Performed: OPEN UMBILICAL HERNIA REPAIR WITH MESH (Abdomen) INSERTION OF MESH (Abdomen)  Patient Location: PACU  Anesthesia Type:General  Level of Consciousness: awake, alert , oriented and patient cooperative  Airway & Oxygen Therapy: Patient Spontanous Breathing and Patient connected to face mask oxygen  Post-op Assessment: Report given to RN, Post -op Vital signs reviewed and stable and Patient moving all extremities  Post vital signs: Reviewed and stable  Last Vitals:  Vitals Value Taken Time  BP 133/81 10/22/20 1141  Temp 37.2 C 10/22/20 1140  Pulse 81 10/22/20 1143  Resp 16 10/22/20 1143  SpO2 93 % 10/22/20 1143  Vitals shown include unvalidated device data.  Last Pain:  Vitals:   10/22/20 0857  PainSc: 0-No pain      Patients Stated Pain Goal: 0 (10/22/20 0857)  Complications: No notable events documented.

## 2020-10-22 NOTE — Anesthesia Postprocedure Evaluation (Signed)
Anesthesia Post Note  Patient: Wayne Jordan  Procedure(s) Performed: OPEN UMBILICAL HERNIA REPAIR WITH MESH (Abdomen) INSERTION OF MESH (Abdomen)     Patient location during evaluation: PACU Anesthesia Type: General Level of consciousness: awake and alert Pain management: pain level controlled Vital Signs Assessment: post-procedure vital signs reviewed and stable Respiratory status: spontaneous breathing, nonlabored ventilation and respiratory function stable Cardiovascular status: stable and blood pressure returned to baseline Anesthetic complications: no   No notable events documented.  Last Vitals:  Vitals:   10/22/20 1210 10/22/20 1225  BP: 129/63 127/60  Pulse: 81 64  Resp: 16 13  Temp:  36.9 C  SpO2: 93% 93%    Last Pain:  Vitals:   10/22/20 1210  PainSc: 0-No pain                 Beryle Lathe

## 2020-10-22 NOTE — Anesthesia Procedure Notes (Signed)
Procedure Name: Intubation Date/Time: 10/22/2020 10:09 AM Performed by: Barrington Ellison, CRNA Pre-anesthesia Checklist: Patient identified, Emergency Drugs available, Suction available and Patient being monitored Patient Re-evaluated:Patient Re-evaluated prior to induction Oxygen Delivery Method: Circle System Utilized Preoxygenation: Pre-oxygenation with 100% oxygen Induction Type: IV induction Ventilation: Mask ventilation without difficulty Laryngoscope Size: Mac and 4 Grade View: Grade I Tube type: Oral Tube size: 7.5 mm Number of attempts: 1 Airway Equipment and Method: Stylet and Oral airway Placement Confirmation: ETT inserted through vocal cords under direct vision, positive ETCO2 and breath sounds checked- equal and bilateral Secured at: 21 cm Tube secured with: Tape Dental Injury: Teeth and Oropharynx as per pre-operative assessment

## 2020-10-22 NOTE — Progress Notes (Signed)
Pharmacy called questioning allergy to Ancef. Patient stated he has never had a reaction to Ancef.

## 2020-10-23 ENCOUNTER — Encounter (HOSPITAL_COMMUNITY): Payer: Self-pay | Admitting: Surgery

## 2021-01-03 ENCOUNTER — Other Ambulatory Visit: Payer: Self-pay | Admitting: Cardiology

## 2021-02-06 ENCOUNTER — Other Ambulatory Visit: Payer: Self-pay

## 2021-02-06 MED ORDER — AMLODIPINE BESYLATE 5 MG PO TABS: 5.0000 mg | ORAL_TABLET | Freq: Every day | ORAL | 3 refills | Status: DC

## 2021-02-06 MED ORDER — ASPIRIN EC 81 MG PO TBEC: 81.0000 mg | DELAYED_RELEASE_TABLET | Freq: Every day | ORAL | 3 refills | Status: AC

## 2021-02-06 MED ORDER — ATORVASTATIN CALCIUM 80 MG PO TABS: 80.0000 mg | ORAL_TABLET | Freq: Every day | ORAL | 3 refills | Status: DC

## 2021-02-06 MED ORDER — NITROGLYCERIN 0.4 MG SL SUBL: 0.4000 mg | SUBLINGUAL_TABLET | SUBLINGUAL | 9 refills | Status: DC | PRN

## 2021-02-06 MED ORDER — CLOPIDOGREL BISULFATE 75 MG PO TABS: 75.0000 mg | ORAL_TABLET | Freq: Every day | ORAL | 3 refills | Status: DC

## 2021-02-13 ENCOUNTER — Other Ambulatory Visit: Payer: Self-pay

## 2021-02-13 MED ORDER — AMLODIPINE BESYLATE 5 MG PO TABS
5.0000 mg | ORAL_TABLET | Freq: Every day | ORAL | 3 refills | Status: DC
  Filled 2021-02-13: qty 90, 90d supply, fill #0
  Filled 2021-05-15: qty 90, 90d supply, fill #1
  Filled 2021-08-10: qty 90, 90d supply, fill #2
  Filled 2021-11-12: qty 90, 90d supply, fill #3

## 2021-02-13 MED ORDER — CLOPIDOGREL BISULFATE 75 MG PO TABS
75.0000 mg | ORAL_TABLET | Freq: Every day | ORAL | 3 refills | Status: DC
  Filled 2021-02-13: qty 90, 90d supply, fill #0
  Filled 2021-05-15: qty 90, 90d supply, fill #1
  Filled 2021-08-10: qty 90, 90d supply, fill #2
  Filled 2021-11-12: qty 90, 90d supply, fill #3

## 2021-02-13 MED ORDER — ATORVASTATIN CALCIUM 80 MG PO TABS
80.0000 mg | ORAL_TABLET | Freq: Every day | ORAL | 3 refills | Status: DC
  Filled 2021-02-13 – 2021-02-16 (×2): qty 90, 90d supply, fill #0
  Filled 2021-05-15: qty 90, 90d supply, fill #1
  Filled 2021-08-10: qty 90, 90d supply, fill #2
  Filled 2021-11-12: qty 90, 90d supply, fill #3

## 2021-02-13 MED ORDER — NITROGLYCERIN 0.4 MG SL SUBL
0.4000 mg | SUBLINGUAL_TABLET | SUBLINGUAL | 9 refills | Status: DC | PRN
  Filled 2021-02-13: qty 25, 10d supply, fill #0

## 2021-02-14 ENCOUNTER — Other Ambulatory Visit (HOSPITAL_COMMUNITY): Payer: Self-pay

## 2021-02-16 ENCOUNTER — Other Ambulatory Visit (HOSPITAL_COMMUNITY): Payer: Self-pay

## 2021-02-17 ENCOUNTER — Other Ambulatory Visit (HOSPITAL_COMMUNITY): Payer: Self-pay

## 2021-04-21 ENCOUNTER — Encounter: Payer: Self-pay | Admitting: *Deleted

## 2021-04-21 ENCOUNTER — Other Ambulatory Visit: Payer: Self-pay

## 2021-04-21 DIAGNOSIS — I2510 Atherosclerotic heart disease of native coronary artery without angina pectoris: Secondary | ICD-10-CM

## 2021-04-21 NOTE — Addendum Note (Signed)
Addended by: Heywood Bene on: 04/21/2021 08:14 AM ? ? Modules accepted: Orders ? ?

## 2021-04-21 NOTE — Addendum Note (Signed)
Addended by: Gypsy Balsam on: 04/21/2021 11:33 AM ? ? Modules accepted: Orders ? ?

## 2021-04-22 ENCOUNTER — Other Ambulatory Visit: Payer: Self-pay

## 2021-04-22 ENCOUNTER — Ambulatory Visit (INDEPENDENT_AMBULATORY_CARE_PROVIDER_SITE_OTHER): Payer: 59

## 2021-04-22 DIAGNOSIS — E669 Obesity, unspecified: Secondary | ICD-10-CM

## 2021-04-22 DIAGNOSIS — E782 Mixed hyperlipidemia: Secondary | ICD-10-CM

## 2021-04-22 DIAGNOSIS — I2510 Atherosclerotic heart disease of native coronary artery without angina pectoris: Secondary | ICD-10-CM | POA: Diagnosis not present

## 2021-04-22 LAB — MYOCARDIAL PERFUSION IMAGING
LV dias vol: 112 mL (ref 62–150)
LV sys vol: 37 mL
Nuc Stress EF: 67 %
Peak HR: 100 {beats}/min
Rest HR: 67 {beats}/min
Rest Nuclear Isotope Dose: 11 mCi
SDS: 3
SRS: 0
SSS: 3
ST Depression (mm): 0 mm
Stress Nuclear Isotope Dose: 32.4 mCi
TID: 0.83

## 2021-04-22 MED ORDER — REGADENOSON 0.4 MG/5ML IV SOLN
0.4000 mg | Freq: Once | INTRAVENOUS | Status: AC
  Administered 2021-04-22: 0.4 mg via INTRAVENOUS

## 2021-04-22 MED ORDER — TECHNETIUM TC 99M TETROFOSMIN IV KIT
32.4000 | PACK | Freq: Once | INTRAVENOUS | Status: AC | PRN
  Administered 2021-04-22: 32.4 via INTRAVENOUS

## 2021-04-22 MED ORDER — TECHNETIUM TC 99M TETROFOSMIN IV KIT
11.0000 | PACK | Freq: Once | INTRAVENOUS | Status: AC | PRN
  Administered 2021-04-22: 11 via INTRAVENOUS

## 2021-04-22 NOTE — Progress Notes (Signed)
Per Dr. Arn Medal approved referral to [facility]. Order of file and faxed to [phone] ?

## 2021-04-27 ENCOUNTER — Ambulatory Visit (INDEPENDENT_AMBULATORY_CARE_PROVIDER_SITE_OTHER): Payer: 59 | Admitting: Cardiology

## 2021-04-27 ENCOUNTER — Encounter: Payer: Self-pay | Admitting: Cardiology

## 2021-04-27 ENCOUNTER — Other Ambulatory Visit: Payer: Self-pay | Admitting: Cardiology

## 2021-04-27 VITALS — BP 136/74 | HR 77 | Ht 71.0 in | Wt 288.4 lb

## 2021-04-27 DIAGNOSIS — Z9989 Dependence on other enabling machines and devices: Secondary | ICD-10-CM

## 2021-04-27 DIAGNOSIS — I252 Old myocardial infarction: Secondary | ICD-10-CM | POA: Diagnosis not present

## 2021-04-27 DIAGNOSIS — G4733 Obstructive sleep apnea (adult) (pediatric): Secondary | ICD-10-CM

## 2021-04-27 DIAGNOSIS — I2510 Atherosclerotic heart disease of native coronary artery without angina pectoris: Secondary | ICD-10-CM | POA: Diagnosis not present

## 2021-04-27 DIAGNOSIS — E7800 Pure hypercholesterolemia, unspecified: Secondary | ICD-10-CM

## 2021-04-27 MED ORDER — METOPROLOL TARTRATE 100 MG PO TABS: ORAL_TABLET | ORAL | 3 refills | Status: DC

## 2021-04-27 NOTE — Progress Notes (Signed)
?Cardiology Office Note:   ? ?Date:  04/27/2021  ? ?ID:  Wayne Jordan, DOB 1960-09-25, MRN [id number] ? ?PCP:  Camillia Herter, NP  ?Cardiologist:  Jenne Campus, MD   ? ?Referring MD: Camillia Herter, NP  ? ?Chief Complaint  ?Patient presents with  ? stress test results  ? ? ?History of Present Illness:   ? ?Wayne Jordan is a 61 y.o. male with past medical history significant for coronary artery disease, status post drug-eluting stent done in September 2014 to LAD in face of non-STEMI.  Also essential hypertension, dyslipidemia sleep apnea.  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  Overall ischemia is only mild but still present.  He is completely asymptomatic.  He does not have any chest pain tightness squeezing pressure burning chest.  Interestingly in September 2014 his symptoms where shortness of breath.  He does not have this anymore.  We had a long discussion about what to do with the situation with talk about it or medical therapy versus coronary CT angio versus cardiac catheterization.  He elected proceed with coronary CT angio. ? ?Past Medical History:  ?Diagnosis Date  ? Acute myocardial infarction of other anterior wall, initial episode of care   ? CAD- LAD DES in setting of NSTEMI Sept 2014 04/06/2013  ? Coronary artery disease   ? s/P PCI of the LAD with DES.  Nuclear stress test 04/2014 with no ischemia  ? Displaced fracture of proximal phalanx of left little finger with routine healing 08/01/2018  ? Encounter for long-term (current) use of other medications 04/06/2013  ? Hypercholesteremia   ? Hyperlipidemia   ? Mixed hyperlipidemia 04/06/2013  ? Morbid obesity due to excess calories ([place]) 05/26/2015  ? Nondisplaced fracture of proximal phalanx of left great toe, initial encounter for closed fracture 08/01/2018  ? Obesity, unspecified 10/08/2012  ? Old  MI (myocardial infarction) 04/06/2013  ? OSA on CPAP   ? followed by Dr. Andrew Au  ? Personal history of urinary calculi   ? Primary osteoarthritis of left hip 09/20/2018  ? Primary osteoarthritis of right hip 11/21/2018  ? Status post total hip replacement, left 04/10/2019  ? Status post total replacement of left hip 01/15/2019  ? 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   ? Umbilical hernia without obstruction and without gangrene 08/25/2020  ? ? ?Past Surgical History:  ?Procedure Laterality Date  ? INGUINAL HERNIA REPAIR Right 03/08/2012  ? Procedure: HERNIA REPAIR INGUINAL ADULT;  Surgeon: Ralene Ok, MD;  Location: [place];  Service: General;  Laterality: Right;  ? INSERTION OF MESH N/A 03/08/2012  ? Procedure: INSERTION OF MESH;  Surgeon: Ralene Ok, MD;  Location: [place];  Service: General;  Laterality: N/A;  ? INSERTION OF MESH N/A 10/22/2020  ? Procedure: INSERTION OF MESH;  Surgeon: Dwan Bolt, MD;  Location: [place];  Service: General;  Laterality: N/A;  ? LEFT HEART CATHETERIZATION WITH CORONARY ANGIOGRAM Right 10/07/2012  ? Procedure: LEFT HEART CATHETERIZATION WITH CORONARY ANGIOGRAM;  Surgeon: Jettie Booze, MD;  Location: [facility] CATH LAB;  Service: Cardiovascular;  Laterality: Right;  ? METACARPAL OSTEOTOMY Left 11/01/2018  ? Procedure: Left small finger osteophyte removal;  Surgeon: Leandrew Koyanagi, MD;  Location: [place];  Service: Orthopedics;  Laterality: Left;  ? PERCUTANEOUS STENT INTERVENTION  10/07/2012  ? Procedure: PERCUTANEOUS STENT  INTERVENTION;  Surgeon: Jettie Booze, MD;  Location: [facility] CATH LAB;  Service: Cardiovascular;;  ? TOTAL HIP ARTHROPLASTY Left 01/15/2019  ? Procedure: LEFT TOTAL HIP ARTHROPLASTY ANTERIOR APPROACH;  Surgeon: Leandrew Koyanagi, MD;  Location: [place];  Service: Orthopedics;  Laterality: Left;  ? UMBILICAL HERNIA REPAIR N/A 10/22/2020  ? Procedure: OPEN UMBILICAL HERNIA REPAIR WITH MESH;  Surgeon: Dwan Bolt, MD;  Location: [place];  Service: General;   Laterality: N/A;  ? ? ?Current Medications: ?Current Meds  ?Medication Sig  ? amLODipine (NORVASC) 5 MG tablet Take 1 tablet (5 mg total) by mouth daily.  ? ascorbic acid (VITAMIN C) 500 MG tablet Take 1,000 mg by mouth daily.  ? aspirin EC 81 MG tablet Take 1 tablet (81 mg total) by mouth daily. Swallow whole.  ? atorvastatin (LIPITOR) 80 MG tablet Take 1 tablet (80 mg total) by mouth daily.  ? clopidogrel (PLAVIX) 75 MG tablet Take 1 tablet (75 mg total) by mouth daily.  ? nitroGLYCERIN (NITROSTAT) 0.4 MG SL tablet Place 1 tablet (0.4 mg total) under the tongue every 5 (five) minutes x 3 doses as needed for chest pain.  ? zinc gluconate 50 MG tablet Take 50 mg by mouth daily.  ?  ? ?Allergies:   Coconut fatty acids and Penicillins  ? ?Social History  ? ?Socioeconomic History  ? Marital status: Married  ?  Spouse name: Not on file  ? Number of children: Not on file  ? Years of education: Not on file  ? Highest education level: Not on file  ?Occupational History  ? Not on file  ?Tobacco Use  ? Smoking status: Former  ?  Packs/day: 1.50  ?  Years: 7.00  ?  Pack years: 10.50  ?  Types: Cigarettes  ?  Quit date: 10/01/2012  ?  Years since quitting: 8.5  ? Smokeless tobacco: Never  ?Vaping Use  ? Vaping Use: Never used  ?Substance and Sexual Activity  ? Alcohol use: Not Currently  ?  Comment: occassionally  ? Drug use: No  ? Sexual activity: Yes  ?Other Topics Concern  ? Not on file  ?Social History Narrative  ? Drinks 2 cheerwines daily.  ? ?Social Determinants of Health  ? ?Financial Resource Strain: Not on file  ?Food Insecurity: Not on file  ?Transportation Needs: Not on file  ?Physical Activity: Not on file  ?Stress: Not on file  ?Social Connections: Not on file  ?  ? ?Family History: ?The patient's family history includes CAD in his father; Heart attack in his father; Heart disease in his father. ?ROS:   ?Please see the history of present illness.    ?All 14 point review of systems negative except as described per  history of present illness ? ?EKGs/Labs/Other Studies Reviewed:   ? ? ? ?Recent Labs: ?07/09/2020: ALT 49; TSH 2.240 ?10/17/2020: BUN 9; Creatinine, Ser 0.99; Hemoglobin 15.3; Platelets 222; Potassium 4.2; Sodium 138  ?Recent Lipid Panel ?   ?Component Value Date/Time  ? CHOL 112 07/03/2020 0831  ? CHOL CANCELED 11/07/2014 0921  ? CHOL SEE BELOW 11/07/2014 0921  ? CHOL 128 04/26/2014 0757  ? TRIG 145 07/03/2020 0831  ? TRIG CANCELED 11/07/2014 0921  ? TRIG SEE BELOW 11/07/2014 0921  ? TRIG 140 04/26/2014 0757  ? HDL 37 (L) 07/03/2020 0831  ? HDL CANCELED 11/07/2014 0921  ? HDL SEE BELOW 11/07/2014 0921  ? HDL 39 (L) 04/26/2014 0757  ? CHOLHDL 3.0 07/03/2020 0831  ?  CHOLHDL 2.6 04/18/2015 0930  ? VLDL 22 04/18/2015 0930  ? [place] 50 07/03/2020 0831  ? [place] CANCELED 11/07/2014 0921  ? [place] SEE BELOW 11/07/2014 0921  ? [place] 61 04/26/2014 0757  ? ? ?Physical Exam:   ? ?VS:  BP 136/74 (BP Location: Right Arm, Patient Position: Sitting)   Pulse 77   Ht 5\' 11"  (1.803 m)   Wt 288 lb 6.4 oz (130.8 kg)   SpO2 97%   BMI 40.22 kg/m?    ? ?Wt Readings from Last 3 Encounters:  ?04/27/21 288 lb 6.4 oz (130.8 kg)  ?04/22/21 277 lb (125.6 kg)  ?10/22/20 284 lb (128.8 kg)  ?  ? ?GEN:  Well nourished, well developed in no acute distress ?HEENT: Normal ?NECK: No JVD; No carotid bruits ?LYMPHATICS: No lymphadenopathy ?CARDIAC: RRR, no murmurs, no rubs, no gallops ?RESPIRATORY:  Clear to auscultation without rales, wheezing or rhonchi  ?ABDOMEN: Soft, non-tender, non-distended ?MUSCULOSKELETAL:  No edema; No deformity  ?SKIN: Warm and dry ?LOWER EXTREMITIES: no swelling ?NEUROLOGIC:  Alert and oriented x 3 ?PSYCHIATRIC:  Normal affect  ? ?ASSESSMENT:   ? ?1. Coronary artery disease involving native coronary artery of native heart without angina pectoris   ?2. Old MI (myocardial infarction)   ?3. OSA on CPAP   ?4. Morbid obesity due to excess calories ([place])   ?5. Hypercholesteremia   ? ?PLAN:   ? ?In order of problems listed  above: ? ?Known coronary artery disease status post PTCA and stenting in 2014 of LAD.  Now it looks like we may be having ischemia well and feel well however he is completely asymptomatic.  We had a long d

## 2021-04-27 NOTE — Patient Instructions (Signed)
Medication Instructions:  ?Your physician recommends that you continue on your current medications as directed. Please refer to the Current Medication list given to you today.  ?*If you need a refill on your cardiac medications before your next appointment, please call your pharmacy* ? ? ?Lab Work: ?Your physician recommends that you return for lab work in: BMET ?If you have labs (blood work) drawn today and your tests are completely normal, you will receive your results only by: ?MyChart Message (if you have MyChart) OR ?A paper copy in the mail ?If you have any lab test that is abnormal or we need to change your treatment, we will call you to review the results. ? ? ?Testing/Procedures: ?Medication Instructions:  ?Will Take Lopressor 100mg  2 hours before scan ? ?*If you need a refill on your cardiac medications before your next appointment, please call your pharmacy* ? ? ?Lab Work: 1 Week prior to CT Scan ? ? ? ? ?Testing/Procedures: ? ? ?Your cardiac CT will be scheduled at one of the below locations:  ? ?[facility] ?[address] ?[place], [place] [address] ?[phone] ? ? ?At [facility], please arrive at the [facility] and Children's Entrance (Entrance C2) of [facility] 30 minutes prior to test start time. ?You can use the FREE valet parking offered at entrance C (encouraged to control the heart rate for the test)  ?Proceed to the [facility] Radiology Department (first floor) to check-in and test prep. ? ?All radiology patients and guests should use entrance C2 at [facility], accessed from [facility], even though the hospital's physical address listed is [address]. ? ? ? ? ? ?Please follow these instructions carefully (unless otherwise directed): ? ?Hold all erectile dysfunction medications at least 3 days (72 hrs) prior to test. ? ?On the Night Before the Test: ?Be sure to Drink plenty of water. ?Do not consume any caffeinated/decaffeinated  beverages or chocolate 12 hours prior to your test. ?Do not take any antihistamines 12 hours prior to your test. ? ?On the Day of the Test: ?Drink plenty of water until 1 hour prior to the test. ?Do not eat any food 4 hours prior to the test. ?You may take your regular medications prior to the test.  ?Take metoprolol (Lopressor) two hours prior to test. ? ? ?After the Test: ?Drink plenty of water. ?After receiving IV contrast, you may experience a mild flushed feeling. This is normal. ?On occasion, you may experience a mild rash up to 24 hours after the test. This is not dangerous. If this occurs, you can take Benadryl 25 mg and increase your fluid intake. ?If you experience trouble breathing, this can be serious. If it is severe call 911 IMMEDIATELY. If it is mild, please call our office. ?If you take any of these medications: Glipizide/Metformin, Avandament, Glucavance, please do not take 48 hours after completing test unless otherwise instructed. ? ?We will call to schedule your test 2-4 weeks out understanding that some insurance companies will need an authorization prior to the service being performed.  ? ?For non-scheduling related questions, please contact the cardiac imaging nurse navigator should you have any questions/concerns: ?Marchia Bond, Cardiac Imaging Nurse Navigator ?Gordy Clement, Cardiac Imaging Nurse Navigator ?[facility] ?Direct Office Dial: [phone]  ? ?For scheduling needs, including cancellations and rescheduling, please call [place], [phone].  ? ? ?Follow-Up: ?At [facility], you and your health needs are our priority.  As part  of our continuing mission to provide you with exceptional heart care, we have created designated Provider Care Teams.  These Care Teams include your primary Cardiologist (physician) and Advanced Practice Providers (APPs -  Physician Assistants and Nurse Practitioners) who all work together to provide you with the care you  need, when you need it. ? ?We recommend signing up for the patient portal called "MyChart".  Sign up information is provided on this After Visit Summary.  MyChart is used to connect with patients for Virtual Visits (Telemedicine).  Patients are able to view lab/test results, encounter notes, upcoming appointments, etc.  Non-urgent messages can be sent to your provider as well.   ?To learn more about what you can do with MyChart, go to [email or web address].   ? ?Your next appointment:   ?3 month(s) ? ?The format for your next appointment:   ?In Person ? ?Provider:   ?Jenne Campus, MD ? ? ?Other Instructions ?Cardiac CT Angiogram ?A cardiac CT angiogram is a procedure to look at the heart and the area around the heart. It may be done to help find the cause of chest pains or other symptoms of heart disease. During this procedure, a substance called contrast dye is injected into the blood vessels in the area to be checked. A large X-ray machine, called a CT scanner, then takes detailed pictures of the heart and the surrounding area. The procedure is also sometimes called a coronary CT angiogram, coronary artery scanning, or CTA. ?A cardiac CT angiogram allows the health care provider to see how well blood is flowing to and from the heart. The health care provider will be able to see if there are any problems, such as: ?Blockage or narrowing of the coronary arteries in the heart. ?Fluid around the heart. ?Signs of weakness or disease in the muscles, valves, and tissues of the heart. ?Tell a health care provider about: ?Any allergies you have. This is especially important if you have had a previous allergic reaction to contrast dye. ?All medicines you are taking, including vitamins, herbs, eye drops, creams, and over-the-counter medicines. ?Any blood disorders you have. ?Any surgeries you have had. ?Any medical conditions you have. ?Whether you are pregnant or may be pregnant. ?Any anxiety disorders, chronic pain,  or other conditions you have that may increase your stress or prevent you from lying still. ?What are the risks? ?Generally, this is a safe procedure. However, problems may occur, including: ?Bleeding. ?Infection. ?Allergic reactions to medicines or dyes. ?Damage to other structures or organs. ?Kidney damage from the contrast dye that is used. ?Increased risk of cancer from radiation exposure. This risk is low. Talk with your health care provider about: ?The risks and benefits of testing. ?How you can receive the lowest dose of radiation. ?What happens before the procedure? ?Wear comfortable clothing and remove any jewelry, glasses, dentures, and hearing aids. ?Follow instructions from your health care provider about eating and drinking. This may include: ?For 12 hours before the procedure -- avoid caffeine. This includes tea, coffee, soda, energy drinks, and diet pills. Drink plenty of water or other fluids that do not have caffeine in them. Being well hydrated can prevent complications. ?For 4-6 hours before the procedure -- stop eating and drinking. The contrast dye can cause nausea, but this is less likely if your stomach is empty. ?Ask your health care provider about changing or stopping your regular medicines. This is especially important if you are taking diabetes medicines, blood thinners, or medicines to treat problems  with erections (erectile dysfunction). ?What happens during the procedure? ? ?Hair on your chest may need to be removed so that small sticky patches called electrodes can be placed on your chest. These will transmit information that helps to monitor your heart during the procedure. ?An IV will be inserted into one of your veins. ?You might be given a medicine to control your heart rate during the procedure. This will help to ensure that good images are obtained. ?You will be asked to lie on an exam table. This table will slide in and out of the CT machine during the procedure. ?Contrast dye  will be injected into the IV. You might feel warm, or you may get a metallic taste in your mouth. ?You will be given a medicine called nitroglycerin. This will relax or dilate the arteries in your heart. ?T

## 2021-05-07 DIAGNOSIS — Z0289 Encounter for other administrative examinations: Secondary | ICD-10-CM

## 2021-05-08 ENCOUNTER — Telehealth (HOSPITAL_COMMUNITY): Payer: Self-pay | Admitting: *Deleted

## 2021-05-08 NOTE — Telephone Encounter (Signed)
Attempted to call patient regarding upcoming cardiac CT appointment. °Left message on voicemail with name and callback number ° °Zacary Bauer RN Navigator Cardiac Imaging °[facility] °[phone] Office °[phone] Cell ° °

## 2021-05-08 NOTE — Telephone Encounter (Signed)
Reaching out to patient to offer assistance regarding upcoming cardiac imaging study; pt verbalizes understanding of appt date/time, parking situation and where to check in, pre-test NPO status and medications ordered, and verified current allergies; name and call back number provided for further questions should they arise ? ?Larey Brick RN Navigator Cardiac Imaging ?[facility] ?[phone] office ?[phone] cell ? ?Patient to take 100mg  metoprolol tartrate two hours prior to his cardiac CT scan.  He is aware to arrive at 7:15am. ?

## 2021-05-11 ENCOUNTER — Ambulatory Visit (HOSPITAL_COMMUNITY)
Admission: RE | Admit: 2021-05-11 | Discharge: 2021-05-11 | Disposition: A | Discharge status: Home / Self Care | Payer: 59 | Source: Ambulatory Visit | Attending: Cardiology | Admitting: Cardiology

## 2021-05-11 DIAGNOSIS — I2510 Atherosclerotic heart disease of native coronary artery without angina pectoris: Secondary | ICD-10-CM

## 2021-05-11 MED ORDER — NITROGLYCERIN 0.4 MG SL SUBL
0.8000 mg | SUBLINGUAL_TABLET | Freq: Once | SUBLINGUAL | Status: AC
  Administered 2021-05-11: 0.8 mg via SUBLINGUAL

## 2021-05-11 MED ORDER — NITROGLYCERIN 0.4 MG SL SUBL
SUBLINGUAL_TABLET | SUBLINGUAL | Status: AC
  Filled 2021-05-11: qty 1

## 2021-05-11 MED ORDER — IOHEXOL 350 MG/ML SOLN
100.0000 mL | Freq: Once | INTRAVENOUS | Status: AC | PRN
  Administered 2021-05-11: 100 mL via INTRAVENOUS

## 2021-05-13 ENCOUNTER — Encounter (INDEPENDENT_AMBULATORY_CARE_PROVIDER_SITE_OTHER): Payer: Self-pay

## 2021-05-13 ENCOUNTER — Ambulatory Visit (INDEPENDENT_AMBULATORY_CARE_PROVIDER_SITE_OTHER): Payer: Self-pay | Admitting: Bariatrics

## 2021-05-15 ENCOUNTER — Telehealth: Payer: Self-pay

## 2021-05-15 ENCOUNTER — Other Ambulatory Visit (HOSPITAL_COMMUNITY): Payer: Self-pay

## 2021-05-15 NOTE — Telephone Encounter (Signed)
Pt contacted by Dr. Bing Matter regarding CT results.  ?

## 2021-05-21 ENCOUNTER — Ambulatory Visit (INDEPENDENT_AMBULATORY_CARE_PROVIDER_SITE_OTHER): Payer: 59 | Admitting: Bariatrics

## 2021-05-21 ENCOUNTER — Encounter (INDEPENDENT_AMBULATORY_CARE_PROVIDER_SITE_OTHER): Payer: Self-pay | Admitting: Bariatrics

## 2021-05-21 VITALS — BP 144/84 | HR 71 | Temp 97.4°F | Ht 71.0 in | Wt 282.0 lb

## 2021-05-21 DIAGNOSIS — E668 Other obesity: Secondary | ICD-10-CM

## 2021-05-21 DIAGNOSIS — Z6839 Body mass index (BMI) 39.0-39.9, adult: Secondary | ICD-10-CM

## 2021-05-21 DIAGNOSIS — G4733 Obstructive sleep apnea (adult) (pediatric): Secondary | ICD-10-CM

## 2021-05-21 DIAGNOSIS — E7849 Other hyperlipidemia: Secondary | ICD-10-CM | POA: Diagnosis not present

## 2021-05-21 DIAGNOSIS — Z9989 Dependence on other enabling machines and devices: Secondary | ICD-10-CM

## 2021-05-21 DIAGNOSIS — I21A9 Other myocardial infarction type: Secondary | ICD-10-CM

## 2021-05-21 DIAGNOSIS — I2510 Atherosclerotic heart disease of native coronary artery without angina pectoris: Secondary | ICD-10-CM | POA: Diagnosis not present

## 2021-05-21 DIAGNOSIS — R0602 Shortness of breath: Secondary | ICD-10-CM

## 2021-05-21 DIAGNOSIS — R7303 Prediabetes: Secondary | ICD-10-CM

## 2021-05-21 DIAGNOSIS — R5383 Other fatigue: Secondary | ICD-10-CM | POA: Diagnosis not present

## 2021-05-21 DIAGNOSIS — E559 Vitamin D deficiency, unspecified: Secondary | ICD-10-CM

## 2021-05-21 DIAGNOSIS — Z1331 Encounter for screening for depression: Secondary | ICD-10-CM | POA: Diagnosis not present

## 2021-05-22 LAB — TSH+T4F+T3FREE
Free T4: 1.05 ng/dL (ref 0.82–1.77)
T3, Free: 3.4 pg/mL (ref 2.0–4.4)
TSH: 4 u[IU]/mL (ref 0.450–4.500)

## 2021-05-22 LAB — VITAMIN D 25 HYDROXY (VIT D DEFICIENCY, FRACTURES): Vit D, 25-Hydroxy: 24.4 ng/mL — ABNORMAL LOW (ref 30.0–100.0)

## 2021-05-22 LAB — INSULIN, RANDOM: INSULIN: 22.2 u[IU]/mL (ref 2.6–24.9)

## 2021-05-23 ENCOUNTER — Encounter (INDEPENDENT_AMBULATORY_CARE_PROVIDER_SITE_OTHER): Payer: Self-pay | Admitting: Bariatrics

## 2021-05-23 DIAGNOSIS — E559 Vitamin D deficiency, unspecified: Secondary | ICD-10-CM | POA: Insufficient documentation

## 2021-05-23 NOTE — Progress Notes (Signed)
? ? ? ?Chief Complaint:  ? ?OBESITY ?Wayne Jordan (MR# [id number]) is a 61 y.o. male who presents for evaluation and treatment of obesity and related comorbidities. Current BMI is Body mass index is 39.33 kg/m?Wayne Jordan Wayne Jordan has been struggling with his weight for many years and has been unsuccessful in either losing weight, maintaining weight loss, or reaching his healthy weight goal ? ?Wayne Jordan states that he does not enjoy cooking. He states that he snacks after dinner. 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  ? ?Wayne Jordan is currently in the action stage of change and ready to dedicate time achieving and maintaining a healthier weight. Wayne Jordan is interested in becoming our patient and working on intensive lifestyle modifications including (but not limited to) diet and exercise for weight loss. ? ?Wayne Jordan's habits were reviewed today and are as follows: His family eats meals together, he thinks his family will eat healthier with him, he struggles with family and or coworkers weight loss sabotage, his desired weight loss is 77 pounds, he has been heavy most of his life, he started gaining weight in 1995, his heaviest weight ever was 288 pounds, he snacks frequently in the evenings, he skips meals frequently, he is frequently drinking liquids with calories, he frequently makes poor food choices, he frequently eats larger portions than normal, and he struggles with emotional eating. ? ?Depression Screen ?Wayne Jordan's Food and Mood (modified PHQ-9) score was 10. ? ? ?  05/21/2021  ?  7:09 AM  ?Depression screen PHQ 2/9  ?Decreased Interest 1  ?Down, Depressed, Hopeless 1  ?PHQ - 2 Score 2  ?Altered sleeping 2  ?Tired, decreased energy 1  ?Change in appetite 1  ?Feeling bad or failure about yourself  1  ?Trouble concentrating 2  ?Moving slowly or fidgety/restless 1  ?Suicidal thoughts 0  ?PHQ-9 Score 10  ?Difficult doing work/chores Not difficult at all  ? ?Subjective:  ? ?1. Other fatigue ?Case denies daytime somnolence and denies waking up  still tired. Patient has a history of symptoms of daytime fatigue. Wayne Jordan generally gets 7 hours of sleep per night, and states that he has difficulty falling asleep and generally restful sleep. Snoring is present. Apneic episodes is present. Epworth Sleepiness Score is 5. Will continue activities.He  had IC done. His IC was lower than expected and close to normal.  He has a history of myocardial infarction and coronary artery disease.    ? ?2. SOB (shortness of breath) on exertion ?Wayne Jordan notes increasing shortness of breath with exercising and seems to be worsening over time with weight gain. He notes getting out of breath sooner with activity than he used to. This has not gotten worse recently. Wayne Jordan denies shortness of breath at rest or orthopnea.  ? ?3. Other hyperlipidemia ?Wayne Jordan is currently taking atorvastatin.  ? ?4. Prediabetes ?Wayne Jordan is not on medications. His last A1C level was 5.8. ? ?5. Other type of myocardial infarction Wayne Jordan), Old ?Wayne Jordan denies angina. He was diagnosed with myocardial infarction approximately 2014. ? ?6. Coronary artery disease, unspecified vessel or lesion type, unspecified whether angina present, unspecified whether native or transplanted heart ?Wayne Jordan had a stress test 04/22/2021.( native heart without angina.).   ? ?7. OSA on CPAP ?Wayne Jordan use a CPAP at night.  ? ?8. Vitamin D deficiency ?Wayne Jordan is currently taking Zinc and Vitamin C.  ? ?Assessment/Plan:  ? ?1. Other fatigue ?Wayne Jordan does feel that his weight is causing his energy to be lower than it should be. Fatigue may be related  to obesity, depression or many other causes. Labs will be ordered, and in the meanwhile, Wayne Jordan will focus on self care including making healthy food choices, increasing physical activity and focusing on stress reduction. Wayne Jordan will gradually increase activities. We will review EKG today. We will check thyroid panel today.  ?- EKG 12-Lead ?- TSH+T4F+T3Free ? ?2. SOB (shortness of breath) on exertion ?Wayne Jordan  does feel that he gets out of breath more easily that he used to when he exercises. Wayne Jordan's shortness of breath appears to be obesity related and exercise induced. He has agreed to work on weight loss and gradually increase exercise to treat his exercise induced shortness of breath. Will continue to monitor closely.  ?- TSH+T4F+T3Free ? ?3. Other hyperlipidemia ?Cardiovascular risk and specific lipid/LDL goals reviewed.  Wayne Jordan will continue taking atorvastatin. He will have no trans fats. We discussed several lifestyle modifications today and Wayne Jordan will continue to work on diet, exercise and weight loss efforts. Orders and follow up as documented in patient record.  ? ?Counseling ?Intensive lifestyle modifications are the first line treatment for this issue. ?Dietary changes: Increase soluble fiber. Decrease simple carbohydrates. ?Exercise changes: Moderate to vigorous-intensity aerobic activity 150 minutes per week if tolerated. ?Lipid-lowering medications: see documented in medical record. ? ?4. Prediabetes ?Wayne Jordan will keep carbohydrates (sugar and starches low). He was provided insulin and pre-diabetes handout today. We will check insulin today. He will continue to work on weight loss, exercise, and decreasing simple carbohydrates to help decrease the risk of diabetes.  ? ?- Insulin, random ? ?5. Other type of myocardial infarction [facility]), Old ?Wayne Jordan will follow up with cardiologist as needed. He will continue his medications.  ? ?6. Coronary artery disease, unspecified vessel or lesion type, unspecified whether angina present, unspecified whether native or transplanted heart ?Wayne Jordan will follow up with his cardiologist 1 times a year.  ? ?7. OSA on CPAP ?Intensive lifestyle modifications are the first line treatment for this issue. We discussed restful sleep. Wayne Jordan will continue the use of his CPAP. We discussed several lifestyle modifications today and he will continue to work on diet, exercise and weight loss  efforts. We will continue to monitor. Orders and follow up as documented in patient record.   ? ?8. Vitamin D deficiency ?Low Vitamin D level contributes to fatigue and are associated with obesity, breast, and colon cancer. We will check Vitamin D today and Wayne Jordan will follow-up for routine testing of Vitamin D, at least 2-3 times per year to avoid over-replacement. ? ?- VITAMIN D 25 Hydroxy (Vit-D Deficiency, Fractures) ? ?9. Depression screening ?Wayne Jordan had a positive depression screening. Depression is commonly associated with obesity and often results in emotional eating behaviors. We will monitor this closely and work on CBT to help improve the non-hunger eating patterns. Referral to Psychology may be required if no improvement is seen as he continues in our clinic.  ? ?10. Class 2 severe obesity with serious comorbidity and body mass index (BMI) of 39.0 to 39.9 in adult, unspecified obesity type ([place]) ?Wayne Jordan is currently in the action stage of change and his goal is to continue with weight loss efforts. I recommend Wayne Jordan begin the structured treatment plan as follows: ? ?He has agreed to the Category 4 Plan. Wayne Jordan fast on Mondays. ? ?Exercise goals: No exercise has been prescribed at this time.  ? ?Behavioral modification strategies: increasing lean protein intake, decreasing simple carbohydrates, increasing vegetables, increasing water intake, decreasing eating out, no skipping meals, meal planning and cooking  strategies, keeping healthy foods in the home, and planning for success. ? ?He was informed of the importance of frequent follow-up visits to maximize his success with intensive lifestyle modifications for his multiple health conditions. He was informed we would discuss his lab results at his next visit unless there is a critical issue that needs to be addressed sooner. Wayne Jordan agreed to keep his next visit at the agreed upon time to discuss these results. ? ?Objective:  ? ?Blood pressure (!) 144/84, pulse  71, temperature (!) 97.4 ?F (36.3 ?C), height 5\' 11"  (1.803 m), weight 282 lb (127.9 kg), SpO2 96 %. Body mass index is 39.33 kg/m?. ? ?EKG: Normal sinus rhythm, rate 68 bpm. ? ?Indirect Calorimeter comple

## 2021-05-25 ENCOUNTER — Encounter (INDEPENDENT_AMBULATORY_CARE_PROVIDER_SITE_OTHER): Payer: Self-pay | Admitting: Bariatrics

## 2021-06-02 ENCOUNTER — Ambulatory Visit (INDEPENDENT_AMBULATORY_CARE_PROVIDER_SITE_OTHER): Payer: BC Managed Care – PPO | Admitting: Family Medicine

## 2021-06-09 ENCOUNTER — Ambulatory Visit (INDEPENDENT_AMBULATORY_CARE_PROVIDER_SITE_OTHER): Payer: 59 | Admitting: Bariatrics

## 2021-06-16 ENCOUNTER — Ambulatory Visit (INDEPENDENT_AMBULATORY_CARE_PROVIDER_SITE_OTHER): Payer: 59 | Admitting: Bariatrics

## 2021-06-16 ENCOUNTER — Encounter (INDEPENDENT_AMBULATORY_CARE_PROVIDER_SITE_OTHER): Payer: Self-pay | Admitting: Bariatrics

## 2021-06-16 VITALS — BP 123/77 | HR 96 | Temp 97.6°F | Ht 71.0 in | Wt 276.0 lb

## 2021-06-16 DIAGNOSIS — R7303 Prediabetes: Secondary | ICD-10-CM

## 2021-06-16 DIAGNOSIS — Z6838 Body mass index (BMI) 38.0-38.9, adult: Secondary | ICD-10-CM

## 2021-06-16 DIAGNOSIS — E559 Vitamin D deficiency, unspecified: Secondary | ICD-10-CM | POA: Diagnosis not present

## 2021-06-16 DIAGNOSIS — E669 Obesity, unspecified: Secondary | ICD-10-CM

## 2021-06-16 MED ORDER — VITAMIN D (ERGOCALCIFEROL) 1.25 MG (50000 UNIT) PO CAPS: 50000.0000 [IU] | ORAL_CAPSULE | ORAL | 0 refills | Status: DC

## 2021-06-18 NOTE — Progress Notes (Signed)
Chief Complaint:   OBESITY Wayne Jordan is here to discuss his progress with his obesity treatment plan along with follow-up of his obesity related diagnoses. Wayne Jordan is on the Category 4 Plan and states he is following his eating plan approximately 50% of the time. Wayne Jordan states he is going to the gym 30 minutes 3 times per week.  Today's visit was #: 2 Starting weight: 282 lbs Starting date: 05/21/2021 Today's weight: 276 lbs Today's date: 06/16/2021 Total lbs lost to date: 6 lbs Total lbs lost since last in-office visit: 6 lbs  Interim History: Wayne Jordan is down 6 lbs since his last visit. He has had multiple celebrations. He did well with breakfast and eating less at night.   Subjective:   1. Vitamin D insufficiency Wayne Jordan's last Vitamin D was 24.4.  2. Prediabetes Wayne Jordan's last Insulin was 22.2. His last A1C was 5.8.  Assessment/Plan:   1. Vitamin D insufficiency Low Vitamin D level contributes to fatigue and are associated with obesity, breast, and colon cancer. We will refill prescription Vitamin D 50,000 IU every week for 1 month with no refills and Wayne Jordan will follow-up for routine testing of Vitamin D, at least 2-3 times per year to avoid over-replacement.  - Vitamin D, Ergocalciferol, (DRISDOL) 1.25 MG (50000 UNIT) CAPS capsule; Take 1 capsule (50,000 Units total) by mouth every 7 (seven) days.  Dispense: 12 capsule; Refill: 0  2. Prediabetes Wayne Jordan will continue to work on weight loss, exercise, and decreasing simple carbohydrates to help decrease the risk of diabetes. Handouts on Insulin resistance and pre-diabetes was provided today.   3. Obesity, Current BMI 38.6 Wayne Jordan is currently in the action stage of change. As such, his goal is to continue with weight loss efforts. He has agreed to the Category 4 Plan. He is eating smaller portions and fasting on Mondays.  Wayne Jordan will continue meal planning. We reviewed labs from 05/21/2021 Vitamin D, insulin, and thyroid panel. Eating Out  handout was provided today.   Exercise goals:  As is.   Behavioral modification strategies: increasing lean protein intake, decreasing simple carbohydrates, increasing vegetables, increasing water intake, decreasing eating out, no skipping meals, meal planning and cooking strategies, keeping healthy foods in the home, and planning for success.  Wayne Jordan has agreed to follow-up with our clinic in 2 weeks. He was informed of the importance of frequent follow-up visits to maximize his success with intensive lifestyle modifications for his multiple health conditions.   Objective:   Blood pressure 123/77, pulse 96, temperature 97.6 F (36.4 C), height 5\' 11"  (1.803 m), weight 276 lb (125.2 kg), SpO2 97 %. Body mass index is 38.49 kg/m.  General: Cooperative, alert, well developed, in no acute distress. HEENT: Conjunctivae and lids unremarkable. Cardiovascular: Regular rhythm.  Lungs: Normal work of breathing. Neurologic: No focal deficits.   Lab Results  Component Value Date   CREATININE 0.99 10/17/2020   BUN 9 10/17/2020   NA 138 10/17/2020   K 4.2 10/17/2020   CL 106 10/17/2020   CO2 26 10/17/2020   Lab Results  Component Value Date   ALT 49 (H) 07/09/2020   AST 39 07/09/2020   ALKPHOS 138 (H) 07/09/2020   BILITOT 0.5 07/09/2020   Lab Results  Component Value Date   HGBA1C 5.8 (H) 07/09/2020   HGBA1C 5.6 10/07/2012   Lab Results  Component Value Date   INSULIN 22.2 05/21/2021   Lab Results  Component Value Date   TSH 4.000 05/21/2021   Lab  Results  Component Value Date   CHOL 112 07/03/2020   HDL 37 (L) 07/03/2020   LDLCALC 50 07/03/2020   TRIG 145 07/03/2020   CHOLHDL 3.0 07/03/2020   Lab Results  Component Value Date   VD25OH 24.4 (L) 05/21/2021   Lab Results  Component Value Date   WBC 6.5 10/17/2020   HGB 15.3 10/17/2020   HCT 46.8 10/17/2020   MCV 92.5 10/17/2020   PLT 222 10/17/2020   No results found for: IRON, TIBC, FERRITIN  Attestation  Statements:   Reviewed by clinician on day of visit: allergies, medications, problem list, medical history, surgical history, family history, social history, and previous encounter notes.  I, Lizbeth Bark, RMA, am acting as Location manager for [employer], DO.  I have reviewed the above documentation for accuracy and completeness, and I agree with the above. Jearld Lesch, DO

## 2021-06-23 ENCOUNTER — Encounter (INDEPENDENT_AMBULATORY_CARE_PROVIDER_SITE_OTHER): Payer: Self-pay | Admitting: Bariatrics

## 2021-06-29 ENCOUNTER — Ambulatory Visit: Payer: BC Managed Care – PPO | Admitting: Cardiology

## 2021-07-07 ENCOUNTER — Encounter (INDEPENDENT_AMBULATORY_CARE_PROVIDER_SITE_OTHER): Payer: Self-pay | Admitting: Bariatrics

## 2021-07-07 ENCOUNTER — Ambulatory Visit (INDEPENDENT_AMBULATORY_CARE_PROVIDER_SITE_OTHER): Payer: 59 | Admitting: Bariatrics

## 2021-07-07 VITALS — BP 133/76 | HR 94 | Temp 97.6°F | Ht 71.0 in | Wt 274.0 lb

## 2021-07-07 DIAGNOSIS — R632 Polyphagia: Secondary | ICD-10-CM

## 2021-07-07 DIAGNOSIS — E559 Vitamin D deficiency, unspecified: Secondary | ICD-10-CM | POA: Diagnosis not present

## 2021-07-07 DIAGNOSIS — E669 Obesity, unspecified: Secondary | ICD-10-CM

## 2021-07-07 DIAGNOSIS — Z6838 Body mass index (BMI) 38.0-38.9, adult: Secondary | ICD-10-CM | POA: Diagnosis not present

## 2021-07-07 MED ORDER — VITAMIN D (ERGOCALCIFEROL) 1.25 MG (50000 UNIT) PO CAPS: 50000.0000 [IU] | ORAL_CAPSULE | ORAL | 0 refills | Status: DC

## 2021-07-07 NOTE — Progress Notes (Unsigned)
Patient ID: Wayne Jordan, male    DOB: 04-21-1960  MRN: [id number]  CC: Annual Physical Exam  Subjective: Wayne Jordan is a 61 y.o. male who presents for annual physical exam.   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/15/2019   Primary osteoarthritis of right hip 11/21/2018   Primary osteoarthritis of left hip 09/20/2018   Nondisplaced fracture of proximal phalanx of left great toe, initial encounter for closed fracture 08/01/2018   Displaced fracture of proximal phalanx of left little finger with routine healing 08/01/2018   OSA on CPAP 05/26/2015   Morbid obesity due to excess calories (HCC) 05/26/2015   CAD- LAD DES in setting of NSTEMI Sept 2014 04/06/2013   Mixed hyperlipidemia 04/06/2013   Old MI (myocardial infarction) 04/06/2013   Encounter for long-term (current) use of other medications 04/06/2013   Obesity, unspecified 10/08/2012     Current Outpatient Medications on File Prior to Visit  Medication Sig Dispense Refill   amLODipine (NORVASC) 5 MG tablet Take 1 tablet (5 mg total) by mouth daily. 90 tablet 3   ascorbic acid (VITAMIN C) 500 MG tablet Take 1,000 mg by mouth daily.     aspirin EC 81 MG tablet Take 1 tablet (81 mg total) by mouth daily. Swallow whole. 90 tablet 3   atorvastatin (LIPITOR) 80 MG tablet Take 1 tablet (80 mg total) by mouth daily. 90 tablet 3   clopidogrel (PLAVIX) 75 MG tablet Take 1 tablet (75 mg total) by mouth daily. 90 tablet 3   metoprolol tartrate (LOPRESSOR) 100 MG tablet Lopressor 100mg  take 1 tablet 2 hours prior  to the test 180 tablet 3   Multiple Vitamin (MULTI-VITAMIN DAILY PO) Take by mouth.     nitroGLYCERIN (NITROSTAT) 0.4 MG SL tablet Place 1 tablet (0.4 mg total) under the tongue every 5 (five) minutes x 3 doses as needed for chest pain. 25 tablet 9   Probiotic Product (PROBIOTIC DAILY PO) Take by mouth.     Vitamin D, Ergocalciferol, (DRISDOL) 1.25 MG (50000 UNIT) CAPS capsule Take 1 capsule (50,000 Units total) by mouth every 7 (seven) days. 12 capsule 0   zinc gluconate 50 MG tablet Take 50 mg by mouth daily.     No current facility-administered medications on file prior to visit.    Allergies  Allergen Reactions   Coconut Fatty Acids Shortness Of Breath and Swelling   Penicillins Hives and Rash    Did it involve swelling of the face/tongue/throat, SOB, or low BP? Yes Did it involve sudden or severe rash/hives, skin peeling, or any reaction on the inside of your mouth or nose? No Did you need to seek medical attention at a hospital or doctor's office? No When did it last happen?      unknown  If all above answers are "NO", may proceed with cephalosporin use.     Social History   Socioeconomic History   Marital status: Married    Spouse name:   Number of children: Not on file  Years of education: Not on file   Highest education level: Not on file  Occupational History   Occupation: Retired  Tobacco Use   Smoking status: Former    Packs/day: 1.50    Years: 7.00    Total pack years: 10.50    Types: Cigarettes    Quit date: 10/01/2012    Years since quitting: 8.7   Smokeless tobacco: Never  Vaping Use   Vaping Use: Never used  Substance and Sexual Activity   Alcohol use: Not Currently    Comment: occassionally   Drug use: No   Sexual activity: Yes  Other Topics Concern   Not on file  Social History Narrative   Drinks 2 cheerwines daily.   Social Determinants of Health   Financial Resource Strain: Not on file  Food Insecurity: Not on file  Transportation  Needs: Not on file  Physical Activity: Not on file  Stress: Not on file  Social Connections: Not on file  Intimate Partner Violence: Not on file    Family History  Problem Relation Age of Onset   Heart disease Father    Heart attack Father    CAD Father     Past Surgical History:  Procedure Laterality Date   INGUINAL HERNIA REPAIR Right 03/08/2012   Procedure: HERNIA REPAIR INGUINAL ADULT;  Surgeon: Axel Filler, MD;  Location: [facility];  Service: General;  Laterality: Right;   INSERTION OF MESH N/A 03/08/2012   Procedure: INSERTION OF MESH;  Surgeon: Axel Filler, MD;  Location: [facility] OR;  Service: General;  Laterality: N/A;   INSERTION OF MESH N/A 10/22/2020   Procedure: INSERTION OF MESH;  Surgeon: Fritzi Mandes, MD;  Location: [facility] OR;  Service: General;  Laterality: N/A;   LEFT HEART CATHETERIZATION WITH CORONARY ANGIOGRAM Right 10/07/2012   Procedure: LEFT HEART CATHETERIZATION WITH CORONARY ANGIOGRAM;  Surgeon: Corky Crafts, MD;  Location[facility] C[facility] LAB;  Service: Cardiovascular;  Laterality: Right;   METACARPAL OSTEOTOMY Left 11/01/2018   Procedure: Left small finger osteophyte removal;  Surgeon: Tarry Kos, MD;  Location: [facility];  Service: Orthopedics;  Laterality: Left;   PERCUTANEOUS STENT INTERVENTION  10/07/2012   Procedure: PERCUTANEOUS STENT INTERVENTION;  Surgeon: Corky Crafts, MD;  Location[facility] C[facility] LAB;  Service: Cardiovascular;;   TOTAL HIP ARTHROPLASTY Left 01/15/2019   Procedure: LEFT TOTAL HIP ARTHROPLASTY ANTERIOR APPROACH;  Surgeon: Tarry Kos, MD;  Location: [facility] OR;  Service: Orthopedics;  Laterality: Left;   UMBILICAL HERNIA REPAIR N/A 10/22/2020   Procedure: OPEN UMBILICAL HERNIA REPAIR WITH MESH;  Surgeon: Fritzi Mandes, MD;  Location: [facility] OR;  Service: General;  Laterality: N/A;    ROS: Review of Systems Negative except as stated above  PHYSICAL EXAM: There were no vitals taken for this visit.  Physical  Exam  {male adult master:[id number]} {male adult master:[id number]}     Latest Ref Rng & Units 10/17/2020   12:00 PM 07/09/2020   10:45 AM 01/08/2019   11:45 AM  CMP  Glucose 70 - 99 mg/dL 595  83  638   BUN 8 - 23 mg/dL 9  12  11    Creatinine 0.61 - 1.24 mg/dL  7.56  4.33   Sodium 135 - 145 mmol/L 138  141  141   Potassium 3.5 - 5.1 mmol/L 4.2  4.3  4.4   Chloride 98 - 111 mmol/L 106  103  108   CO2 22 - 32 mmol/L 26  20  26  Calcium 8.9 - 10.3 mg/dL 9.2  9.6  9.6   Total Protein 6.0 - 8.5 g/dL  7.5  7.1   Total Bilirubin 0.0 - 1.2 mg/dL  0.5  1.0   Alkaline Phos 44 - 121 IU/L  138  116   AST 0 - 40 IU/L  39  36   ALT 0 - 44 IU/L  49  48    Lipid Panel     Component Value Date/Time   CHOL 112 07/03/2020 0831   CHOL CANCELED 11/07/2014 0921   CHOL SEE BELOW 11/07/2014 0921   CHOL 128 04/26/2014 0757   TRIG 145 07/03/2020 0831   TRIG CANCELED 11/07/2014 0921   TRIG SEE BELOW 11/07/2014 0921   TRIG 140 04/26/2014 0757   HDL 37 (L) 07/03/2020 0831   HDL CANCELED 11/07/2014 0921   HDL SEE BELOW 11/07/2014 0921   HDL 39 (L) 04/26/2014 0757   CHOLHDL 3.0 07/03/2020 0831   CHOLHDL 2.6 04/18/2015 0930   VLDL 22 04/18/2015 0930   LDLCALC 50 07/03/2020 0831   LDLCALC CANCELED 11/07/2014 0921   LDLCALC SEE BELOW 11/07/2014 0921   LDLCALC 61 04/26/2014 0757    CBC    Component Value Date/Time   WBC 6.5 10/17/2020 1200   RBC 5.06 10/17/2020 1200   HGB 15.3 10/17/2020 1200   HGB 16.2 07/09/2020 1045   HCT 46.8 10/17/2020 1200   HCT 51.1 (H) 07/09/2020 1045   PLT 222 10/17/2020 1200   PLT 202 07/09/2020 1045   MCV 92.5 10/17/2020 1200   MCV 92 07/09/2020 1045   MCH 30.2 10/17/2020 1200   MCHC 32.7 10/17/2020 1200   RDW 13.1 10/17/2020 1200   RDW 12.4 07/09/2020 1045   LYMPHSABS 2.4 01/08/2019 1145   MONOABS 0.5 01/08/2019 1145   EOSABS 0.2 01/08/2019 1145   BASOSABS 0.0 01/08/2019 1145    ASSESSMENT AND PLAN:  There are no diagnoses linked to this  encounter.   Patient was given the opportunity to ask questions.  Patient verbalized understanding of the plan and was able to repeat key elements of the plan. Patient was given clear instructions to go to Emergency Department or return to medical center if symptoms don't improve, worsen, or new problems develop.The patient verbalized understanding.   No orders of the defined types were placed in this encounter.    Requested Prescriptions    No prescriptions requested or ordered in this encounter    No follow-ups on file.  Rema Fendt, NP

## 2021-07-08 NOTE — Progress Notes (Unsigned)
Chief Complaint:   OBESITY Wayne Jordan is here to discuss his progress with his obesity treatment plan along with follow-up of his obesity related diagnoses. Wayne Jordan is on the Category 4 Plan and states he is following his eating plan approximately 30% of the time. Wayne Jordan states he is at the gym for 45-60 minutes 3 times per week.  Today's visit was #: 3 Starting weight: 282 lbs Starting date: 05/21/2021 Today's weight: 274 lbs Today's date: 07/07/2021 Total lbs lost to date: 8 Total lbs lost since last in-office visit: 2  Interim History: Wayne Jordan has had some celebrations.  He is tracking his calorie and protein intake.  He is getting in his protein for the most part.  He struggles with Cheerwine soda.  Subjective:   1. Polyphagia Wayne Jordan declines medications.  He notes larger portion size.  2. Vitamin D insufficiency Wayne Jordan is taking vitamin D prescription.  Assessment/Plan:   1. Polyphagia Wayne Jordan will increase his vegetables, lean meat, and increased tuna.  2. Vitamin D insufficiency Wayne Jordan will continue prescription vitamin D once weekly, and we will refill for 2 months.  - Vitamin D, Ergocalciferol, (DRISDOL) 1.25 MG (50000 UNIT) CAPS capsule; Take 1 capsule (50,000 Units total) by mouth every 7 (seven) days.  Dispense: 8 capsule; Refill: 0  3. Obesity, Current BMI 38.3 Wayne Jordan is currently in the action stage of change. As such, his goal is to continue with weight loss efforts. He has agreed to the Category 4 Plan.   We will increase protein and will continue to increase water intake.  Decrease sweets, more low glucose fruit.  We will work on protein control (smaller plate).  Exercise goals: As is.  Behavioral modification strategies: increasing lean protein intake, decreasing simple carbohydrates, increasing vegetables, increasing water intake, decreasing eating out, no skipping meals, meal planning and cooking strategies, keeping healthy foods in the home, and planning for  success.  Wayne Jordan has agreed to follow-up with our clinic in 3 weeks. He was informed of the importance of frequent follow-up visits to maximize his success with intensive lifestyle modifications for his multiple health conditions.   Objective:   Blood pressure 133/76, pulse 94, temperature 97.6 F (36.4 C), height 5\' 11"  (1.803 m), weight 274 lb (124.3 kg), SpO2 96 %. Body mass index is 38.22 kg/m.  General: Cooperative, alert, well developed, in no acute distress. HEENT: Conjunctivae and lids unremarkable. Cardiovascular: Regular rhythm.  Lungs: Normal work of breathing. Neurologic: No focal deficits.   Lab Results  Component Value Date   CREATININE 0.99 10/17/2020   BUN 9 10/17/2020   NA 138 10/17/2020   K 4.2 10/17/2020   CL 106 10/17/2020   CO2 26 10/17/2020   Lab Results  Component Value Date   ALT 49 (H) 07/09/2020   AST 39 07/09/2020   ALKPHOS 138 (H) 07/09/2020   BILITOT 0.5 07/09/2020   Lab Results  Component Value Date   HGBA1C 5.8 (H) 07/09/2020   HGBA1C 5.6 10/07/2012   Lab Results  Component Value Date   INSULIN 22.2 05/21/2021   Lab Results  Component Value Date   TSH 4.000 05/21/2021   Lab Results  Component Value Date   CHOL 112 07/03/2020   HDL 37 (L) 07/03/2020   LDLCALC 50 07/03/2020   TRIG 145 07/03/2020   CHOLHDL 3.0 07/03/2020   Lab Results  Component Value Date   VD25OH 24.4 (L) 05/21/2021   Lab Results  Component Value Date   WBC 6.5 10/17/2020  HGB 15.3 10/17/2020   HCT 46.8 10/17/2020   MCV 92.5 10/17/2020   PLT 222 10/17/2020   No results found for: "IRON", "TIBC", "FERRITIN"  Attestation Statements:   Reviewed by clinician on day of visit: allergies, medications, problem list, medical history, surgical history, family history, social history, and previous encounter notes.   Trude Mcburney, am acting as Energy manager for [employer], DO.  I have reviewed the above documentation for accuracy and completeness,  and I agree with the above. -  ***

## 2021-07-11 ENCOUNTER — Encounter (INDEPENDENT_AMBULATORY_CARE_PROVIDER_SITE_OTHER): Payer: Self-pay | Admitting: Bariatrics

## 2021-07-13 ENCOUNTER — Encounter: Payer: Self-pay | Admitting: Family

## 2021-07-13 ENCOUNTER — Ambulatory Visit (INDEPENDENT_AMBULATORY_CARE_PROVIDER_SITE_OTHER): Payer: 59 | Admitting: Family

## 2021-07-13 VITALS — BP 124/81 | HR 64 | Temp 98.3°F | Resp 16 | Ht 70.98 in | Wt 282.0 lb

## 2021-07-13 DIAGNOSIS — Z0000 Encounter for general adult medical examination without abnormal findings: Secondary | ICD-10-CM

## 2021-07-13 DIAGNOSIS — Z13220 Encounter for screening for lipoid disorders: Secondary | ICD-10-CM

## 2021-07-13 DIAGNOSIS — Z130 Encounter for screening for diseases of the blood and blood-forming organs and certain disorders involving the immune mechanism: Secondary | ICD-10-CM

## 2021-07-13 DIAGNOSIS — Z13228 Encounter for screening for other metabolic disorders: Secondary | ICD-10-CM

## 2021-07-13 DIAGNOSIS — Z1329 Encounter for screening for other suspected endocrine disorder: Secondary | ICD-10-CM

## 2021-07-13 DIAGNOSIS — R7303 Prediabetes: Secondary | ICD-10-CM

## 2021-07-13 DIAGNOSIS — Z1211 Encounter for screening for malignant neoplasm of colon: Secondary | ICD-10-CM

## 2021-07-13 NOTE — Progress Notes (Signed)
Complete physical exam  Patient: Wayne Jordan   DOB: 06-14-1960   61 y.o. Male  MRN: [id number]  Subjective:    Chief Complaint  Patient presents with   Annual Exam    Wayne Jordan is a 61 y.o. male who presents today for a complete physical exam. He reports consuming a general diet. Gym/ health club routine includes stationary bike. He generally feels fairly well. He reports sleeping well. He does not have additional problems to discuss today.    Most recent fall risk assessment:    07/13/2021    8:55 AM  Fall Risk   Falls in the past year? 0  Number falls in past yr: 0  Injury with Fall? 0  Risk for fall due to : No Fall Risks  Follow up Falls evaluation completed     Most recent depression screenings:    07/13/2021    8:55 AM 05/21/2021    7:09 AM  PHQ 2/9 Scores  PHQ - 2 Score 0 2  PHQ- 9 Score 0 10    Vision:Within last year    Patient Care Team: Rema Fendt, NP as PCP - General (Nurse Practitioner) Georgeanna Lea, MD as PCP - Cardiology (Cardiology)   Outpatient Medications Prior to Visit  Medication Sig   amLODipine (NORVASC) 5 MG tablet Take 1 tablet (5 mg total) by mouth daily.   ascorbic acid (VITAMIN C) 500 MG tablet Take 1,000 mg by mouth daily.   aspirin EC 81 MG tablet Take 1 tablet (81 mg total) by mouth daily. Swallow whole.   atorvastatin (LIPITOR) 80 MG tablet Take 1 tablet (80 mg total) by mouth daily.   clopidogrel (PLAVIX) 75 MG tablet Take 1 tablet (75 mg total) by mouth daily.   metoprolol tartrate (LOPRESSOR) 100 MG tablet Lopressor 100mg  take 1 tablet 2 hours prior to the test   Multiple Vitamin (MULTI-VITAMIN DAILY PO) Take by mouth.   nitroGLYCERIN (NITROSTAT) 0.4 MG SL tablet Place 1 tablet (0.4 mg total) under the tongue every 5 (five) minutes x 3 doses as needed for chest pain.   Probiotic Product (PROBIOTIC DAILY PO) Take by mouth.   Vitamin D, Ergocalciferol, (DRISDOL) 1.25 MG (50000 UNIT) CAPS capsule Take 1 capsule  (50,000 Units total) by mouth every 7 (seven) days.   zinc gluconate 50 MG tablet Take 50 mg by mouth daily.   No facility-administered medications prior to visit.    ROS        Objective:     BP 124/81 (BP Location: Left Arm, Patient Position: Sitting, Cuff Size: Large)   Pulse 64   Temp 98.3 F (36.8 C)   Resp 16   Ht 5' 10.98" (1.803 m)   Wt 282 lb (127.9 kg)   SpO2 93%   BMI 39.35 kg/m     Physical Exam   No results found for any visits on 07/13/21.     Assessment & Plan:    Routine Health Maintenance and Physical Exam  Immunization History  Administered Date(s) Administered   Influenza Inj Mdck Quad Pf 10/18/2018   Influenza Inj Mdck Quad With Preservative 11/07/2017   Influenza,inj,Quad PF,6+ Mos 10/14/2015, 03/22/2017   Tdap 07/30/2018   Zoster Recombinat (Shingrix) 07/09/2020, 09/03/2020    Health Maintenance  Topic Date Due   COVID-19 Vaccine (1) Never done   COLONOSCOPY (Pts 45-28yrs Insurance coverage will need to be confirmed)  Never done   INFLUENZA VACCINE  08/18/2021   TETANUS/TDAP  07/29/2028  Hepatitis C Screening  Completed   HIV Screening  Completed   Zoster Vaccines- Shingrix  Completed   HPV VACCINES  Aged Out    Discussed health benefits of physical activity, and encouraged him to engage in regular exercise appropriate for his age and condition.  Problem List Items Addressed This Visit     Prediabetes   Relevant Orders   Hemoglobin A1c   Other Visit Diagnoses     Annual physical exam    -  Primary   Screening for metabolic disorder       Relevant Orders   CMP14+EGFR   Screening for deficiency anemia       Relevant Orders   CBC   Screening cholesterol level       Relevant Orders   Lipid panel   Thyroid disorder screen       Relevant Orders   TSH   Colon cancer screening       Relevant Orders   Ambulatory referral to Gastroenterology      Return in about 1 year (around 07/14/2022) for Physical per patient  preference.     Margorie John, CMA

## 2021-07-14 LAB — CMP14+EGFR
ALT: 34 IU/L (ref 0–44)
AST: 28 IU/L (ref 0–40)
Albumin/Globulin Ratio: 1.9 (ref 1.2–2.2)
Albumin: 4.6 g/dL (ref 3.8–4.8)
Alkaline Phosphatase: 105 IU/L (ref 44–121)
BUN/Creatinine Ratio: 9 — ABNORMAL LOW (ref 10–24)
BUN: 10 mg/dL (ref 8–27)
Bilirubin Total: 0.6 mg/dL (ref 0.0–1.2)
CO2: 23 mmol/L (ref 20–29)
Calcium: 10 mg/dL (ref 8.6–10.2)
Chloride: 104 mmol/L (ref 96–106)
Creatinine, Ser: 1.13 mg/dL (ref 0.76–1.27)
Globulin, Total: 2.4 g/dL (ref 1.5–4.5)
Glucose: 108 mg/dL — ABNORMAL HIGH (ref 70–99)
Potassium: 4.8 mmol/L (ref 3.5–5.2)
Sodium: 140 mmol/L (ref 134–144)
Total Protein: 7 g/dL (ref 6.0–8.5)
eGFR: 73 mL/min/{1.73_m2} (ref >59)

## 2021-07-14 LAB — CBC
Hematocrit: 45.8 % (ref 37.5–51.0)
Hemoglobin: 15.6 g/dL (ref 13.0–17.7)
MCH: 30.6 pg (ref 26.6–33.0)
MCHC: 34.1 g/dL (ref 31.5–35.7)
MCV: 90 fL (ref 79–97)
Platelets: 199 10*3/uL (ref 150–450)
RBC: 5.1 x10E6/uL (ref 4.14–5.80)
RDW: 12.8 % (ref 11.6–15.4)
WBC: 6.2 10*3/uL (ref 3.4–10.8)

## 2021-07-14 LAB — LIPID PANEL
Chol/HDL Ratio: 2.4 ratio (ref 0.0–5.0)
Cholesterol, Total: 100 mg/dL (ref 100–199)
HDL: 42 mg/dL (ref >39)
LDL Chol Calc (NIH): 30 mg/dL (ref 0–99)
Triglycerides: 167 mg/dL — ABNORMAL HIGH (ref 0–149)
VLDL Cholesterol Cal: 28 mg/dL (ref 5–40)

## 2021-07-14 LAB — TSH: TSH: 2.6 u[IU]/mL (ref 0.450–4.500)

## 2021-07-14 LAB — HEMOGLOBIN A1C
Est. average glucose Bld gHb Est-mCnc: 117 mg/dL
Hgb A1c MFr Bld: 5.7 % — ABNORMAL HIGH (ref 4.8–5.6)

## 2021-08-04 ENCOUNTER — Encounter (INDEPENDENT_AMBULATORY_CARE_PROVIDER_SITE_OTHER): Payer: Self-pay | Admitting: Bariatrics

## 2021-08-04 ENCOUNTER — Ambulatory Visit (INDEPENDENT_AMBULATORY_CARE_PROVIDER_SITE_OTHER): Payer: 59 | Admitting: Bariatrics

## 2021-08-04 VITALS — BP 137/84 | HR 71 | Temp 97.4°F | Ht 71.0 in | Wt 276.0 lb

## 2021-08-04 DIAGNOSIS — R7303 Prediabetes: Secondary | ICD-10-CM

## 2021-08-04 DIAGNOSIS — Z6838 Body mass index (BMI) 38.0-38.9, adult: Secondary | ICD-10-CM

## 2021-08-04 DIAGNOSIS — E7800 Pure hypercholesterolemia, unspecified: Secondary | ICD-10-CM | POA: Diagnosis not present

## 2021-08-04 DIAGNOSIS — E669 Obesity, unspecified: Secondary | ICD-10-CM

## 2021-08-10 ENCOUNTER — Other Ambulatory Visit (HOSPITAL_COMMUNITY): Payer: Self-pay

## 2021-08-10 NOTE — Progress Notes (Unsigned)
Chief Complaint:   OBESITY Wayne Jordan is here to discuss his progress with his obesity treatment plan along with follow-up of his obesity related diagnoses. Demarrio is on the Category 4 Plan and states he is following his eating plan approximately 20% of the time. Ender states he is exercising at the gym for 60 minutes 3 times per week.  Today's visit was #: 4 Starting weight: 282 lbs Starting date: 05/21/2021 Today's weight: 276 lbs Today's date: 08/04/2021 Total lbs lost to date: 6 Total lbs lost since last in-office visit: 0  Interim History: Wayne Jordan is up 2 pounds since his last visit.  He is getting adequate water and protein in.  Subjective:   1. Hypercholesteremia Wayne Jordan is currently taking Lipitor.  2. Prediabetes Wayne Jordan is not on medications currently.  Assessment/Plan:   1. Hypercholesteremia Wayne Jordan will continue his medications as directed.  2. Prediabetes Wayne Jordan will keep all carbohydrates low (sugar and carbohydrates).  3. Obesity, Current BMI 38.5 Wayne Jordan is currently in the action stage of change. As such, his goal is to continue with weight loss efforts. He has agreed to the Category 4 Plan and keeping a food journal and adhering to recommended goals of 1700 calories and 90-100 grams of protein daily.   Meal planning and intentional eating were discussed.  He will weigh his meat and eat his protein first.  Exercise goals: As is, as swimming.  Behavioral modification strategies: increasing lean protein intake, decreasing simple carbohydrates, increasing vegetables, increasing water intake, decreasing eating out, no skipping meals, meal planning and cooking strategies, keeping healthy foods in the home, and planning for success.  Anh has agreed to follow-up with our clinic in 3 weeks. He was informed of the importance of frequent follow-up visits to maximize his success with intensive lifestyle modifications for his multiple health conditions.   Objective:   Blood  pressure 137/84, pulse 71, temperature (!) 97.4 F (36.3 C), height 5\' 11"  (1.803 m), weight 276 lb (125.2 kg), SpO2 95 %. Body mass index is 38.49 kg/m.  General: Cooperative, alert, well developed, in no acute distress. HEENT: Conjunctivae and lids unremarkable. Cardiovascular: Regular rhythm.  Lungs: Normal work of breathing. Neurologic: No focal deficits.   Lab Results  Component Value Date   CREATININE 1.13 07/13/2021   BUN 10 07/13/2021   NA 140 07/13/2021   K 4.8 07/13/2021   CL 104 07/13/2021   CO2 23 07/13/2021   Lab Results  Component Value Date   ALT 34 07/13/2021   AST 28 07/13/2021   ALKPHOS 105 07/13/2021   BILITOT 0.6 07/13/2021   Lab Results  Component Value Date   HGBA1C 5.7 (H) 07/13/2021   HGBA1C 5.8 (H) 07/09/2020   HGBA1C 5.6 10/07/2012   Lab Results  Component Value Date   INSULIN 22.2 05/21/2021   Lab Results  Component Value Date   TSH 2.600 07/13/2021   Lab Results  Component Value Date   CHOL 100 07/13/2021   HDL 42 07/13/2021   LDLCALC 30 07/13/2021   TRIG 167 (H) 07/13/2021   CHOLHDL 2.4 07/13/2021   Lab Results  Component Value Date   VD25OH 24.4 (L) 05/21/2021   Lab Results  Component Value Date   WBC 6.2 07/13/2021   HGB 15.6 07/13/2021   HCT 45.8 07/13/2021   MCV 90 07/13/2021   PLT 199 07/13/2021   No results found for: "IRON", "TIBC", "FERRITIN"  Attestation Statements:   Reviewed by clinician on day of visit: allergies, medications,  problem list, medical history, surgical history, family history, social history, and previous encounter notes.   Wilhemena Durie, am acting as Location manager for [employer], DO.  I have reviewed the above documentation for accuracy and completeness, and I agree with the above. Jearld Lesch, DO

## 2021-08-12 ENCOUNTER — Encounter (INDEPENDENT_AMBULATORY_CARE_PROVIDER_SITE_OTHER): Payer: Self-pay | Admitting: Bariatrics

## 2021-08-14 ENCOUNTER — Ambulatory Visit: Payer: 59 | Admitting: Cardiology

## 2021-08-19 ENCOUNTER — Encounter: Payer: Self-pay | Admitting: *Deleted

## 2021-08-20 ENCOUNTER — Encounter: Payer: Self-pay | Admitting: Adult Health

## 2021-08-20 ENCOUNTER — Ambulatory Visit: Payer: 59 | Admitting: Adult Health

## 2021-08-20 VITALS — BP 135/71 | HR 78 | Ht 71.0 in | Wt 284.0 lb

## 2021-08-20 DIAGNOSIS — Z9989 Dependence on other enabling machines and devices: Secondary | ICD-10-CM | POA: Diagnosis not present

## 2021-08-20 DIAGNOSIS — G4733 Obstructive sleep apnea (adult) (pediatric): Secondary | ICD-10-CM

## 2021-08-20 NOTE — Patient Instructions (Signed)
Continue using CPAP nightly and greater than 4 hours each night Home sleep test ordered If your symptoms worsen or you develop new symptoms please let us know.   

## 2021-08-20 NOTE — Progress Notes (Signed)
PATIENT: Wayne Jordan DOB: 10-26-1960  REASON FOR VISIT: follow up HISTORY FROM: patient Primary neurologist: Dr. Vickey Huger    HISTORY OF PRESENT ILLNESS: Today 08/20/21:  Wayne Jordan is a 61 year old male with a history of obstructive sleep apnea on CPAP.  He returns today for follow-up.  His download is below. Reports that he never got a new machine after the recall. Straps are too lose he had to have his daughter sew the straps.     08/20/20: Wayne Jordan is a 61 year old male with a history of obstructive sleep apnea on CPAP.  His download indicates that he uses machine 29 out of 30 days for compliance of 96.7%.  Every night that he uses machine he used it greater than 4 hours.  On average he uses it 7 hours and 14 minutes.  His residual AHI is 1.3 on 13 cm of water.  Patient does not have a significant leak with his mask.  Overall he is doing well.  08/20/19: Wayne Jordan is a 61 year old male with a history of obstructive sleep apnea on CPAP.  His download indicates that he uses machine nightly for compliance of 100%.  He uses machine greater than 4 hours each night.  His residual AHI is 1.9 on 13 cm of water.  Reports that the CPAP is working well for him.  Reports that he has not changed out his supplies since last year.  HISTORY The patient states that download indicates that he used his machine 27 out of 30 days for compliance of 90%.  He uses machine greater than 4 hours each night.  On average he uses his machine 7 hours and 37 minutes.  His residual AHI is 2.2 on 13 cm of water.  He states occasionally he will feel the mask leaking however he had a mask refitting and does like his new mask.  He denies any new issues.  REVIEW OF SYSTEMS: Out of a complete 14 system review of symptoms, the patient complains only of the following symptoms, and all other reviewed systems are negative.   ESS 4  ALLERGIES: Allergies  Allergen Reactions   Coconut Fatty Acids Shortness Of Breath and  Swelling   Penicillins Hives and Rash    Did it involve swelling of the face/tongue/throat, SOB, or low BP? Yes Did it involve sudden or severe rash/hives, skin peeling, or any reaction on the inside of your mouth or nose? No Did you need to seek medical attention at a hospital or doctor's office? No When did it last happen?      unknown  If all above answers are "NO", may proceed with cephalosporin use.     HOME MEDICATIONS: Outpatient Medications Prior to Visit  Medication Sig Dispense Refill   amLODipine (NORVASC) 5 MG tablet Take 1 tablet (5 mg total) by mouth daily. 90 tablet 3   ascorbic acid (VITAMIN C) 500 MG tablet Take 1,000 mg by mouth daily.     aspirin EC 81 MG tablet Take 1 tablet (81 mg total) by mouth daily. Swallow whole. 90 tablet 3   atorvastatin (LIPITOR) 80 MG tablet Take 1 tablet (80 mg total) by mouth daily. 90 tablet 3   clopidogrel (PLAVIX) 75 MG tablet Take 1 tablet (75 mg total) by mouth daily. 90 tablet 3   metoprolol tartrate (LOPRESSOR) 100 MG tablet Lopressor 100mg  take 1 tablet 2 hours prior to the test 180 tablet 3   Multiple Vitamin (MULTI-VITAMIN DAILY PO) Take by mouth.  nitroGLYCERIN (NITROSTAT) 0.4 MG SL tablet Place 1 tablet (0.4 mg total) under the tongue every 5 (five) minutes x 3 doses as needed for chest pain. 25 tablet 9   Probiotic Product (PROBIOTIC DAILY PO) Take by mouth.     Vitamin D, Ergocalciferol, (DRISDOL) 1.25 MG (50000 UNIT) CAPS capsule Take 1 capsule (50,000 Units total) by mouth every 7 (seven) days. 8 capsule 0   zinc gluconate 50 MG tablet Take 50 mg by mouth daily.     No facility-administered medications prior to visit.    PAST MEDICAL HISTORY: Past Medical History:  Diagnosis Date   Acute myocardial infarction of other anterior wall, initial episode of care    Alcohol abuse    CAD- LAD DES in setting of NSTEMI Sept 2014 04/06/2013   Chest pain    Coronary artery disease    s/P PCI of the LAD with DES.  Nuclear  stress test 04/2014 with no ischemia   Displaced fracture of proximal phalanx of left little finger with routine healing 08/01/2018   Drug use    Encounter for long-term (current) use of other medications 04/06/2013   History of left hip replacement    Hypercholesteremia    Hyperlipidemia    Joint pain    Mixed hyperlipidemia 04/06/2013   Morbid obesity due to excess calories (HCC) 05/26/2015   Nondisplaced fracture of proximal phalanx of left great toe, initial encounter for closed fracture 08/01/2018   Obesity, unspecified 10/08/2012   Old MI (myocardial infarction) 04/06/2013   OSA on CPAP    followed by Dr. Jessie Foot   Personal history of urinary calculi    Pre-diabetes    Primary osteoarthritis of left hip 09/20/2018   Primary osteoarthritis of right hip 11/21/2018   Shortness of breath    Sleep apnea    Status post total hip replacement, left 04/10/2019   Status post total replacement of left hip 01/15/2019   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    Umbilical hernia without obstruction and without gangrene 08/25/2020    PAST SURGICAL HISTORY: Past Surgical History:  Procedure Laterality Date   INGUINAL HERNIA REPAIR Right 03/08/2012   Procedure: HERNIA REPAIR INGUINAL ADULT;  Surgeon: Axel Filler, MD;  Location[facility];  Service: General;  Laterality: Right;   INSERTION OF MESH N/A 03/08/2012   Procedure: INSERTION OF MESH;  Surgeon: Axel Filler, MD;  Location: [facility] OR;  Service: General;  Laterality: N/A;   INSERTION OF MESH N/A 10/22/2020   Procedure: INSERTION OF MESH;  Surgeon: Fritzi Mandes, MD;  Location: [facility] OR;  Service: General;  Laterality: N/A;   LEFT HEART CATHETERIZATION WITH CORONARY ANGIOGRAM Right 10/07/2012   Procedure: LEFT HEART CATHETERIZATION WITH CORONARY ANGIOGRAM;  Surgeon: Corky Crafts, MD;  Location: [facility] CATH LAB;  Service: Cardiovascular;  Laterality: Right;   METACARPAL OSTEOTOMY Left 11/01/2018   Procedure: Left small finger osteophyte removal;   Surgeon: Tarry Kos, MD;  Location: [facility];  Service: Orthopedics;  Laterality: Left;   PERCUTANEOUS STENT INTERVENTION  10/07/2012   Procedure: PERCUTANEOUS STENT INTERVENTION;  Surgeon: Corky Crafts, MD;  Location: [facility] CATH LAB;  Service: Cardiovascular;;   TOTAL HIP ARTHROPLASTY Left 01/15/2019   Procedure: LEFT TOTAL HIP ARTHROPLASTY ANTERIOR APPROACH;  Surgeon: Tarry Kos, MD;  Location: [facility] OR;  Service: Orthopedics;  Laterality: Left;   UMBILICAL HERNIA REPAIR N/A 10/22/2020   Procedure: OPEN UMBILICAL HERNIA REPAIR WITH MESH;  Surgeon: Fritzi Mandes, MD;  Location: [facility]  OR;  Service: General;  Laterality: N/A;    FAMILY HISTORY: Family History  Problem Relation Age of Onset   Heart disease Father    Heart attack Father    CAD Father     SOCIAL HISTORY: Social History   Socioeconomic History   Marital status: Married    Spouse name: Abbie   Number of children: Not on file   Years of education: Not on file   Highest education level: Not on file  Occupational History   Occupation: Retired  Tobacco Use   Smoking status: Former    Packs/day: 1.50    Years: 7.00    Total pack years: 10.50    Types: Cigarettes    Quit date: 10/01/2012    Years since quitting: 8.8    Passive exposure: Past   Smokeless tobacco: Never  Vaping Use   Vaping Use: Never used  Substance and Sexual Activity   Alcohol use: Not Currently    Comment: occassionally   Drug use: No   Sexual activity: Yes  Other Topics Concern   Not on file  Social History Narrative   Drinks 2 cheerwines daily.   Social Determinants of Health   Financial Resource Strain: Not on file  Food Insecurity: Not on file  Transportation Needs: Not on file  Physical Activity: Not on file  Stress: Not on file  Social Connections: Not on file  Intimate Partner Violence: Not on file      PHYSICAL EXAM  Vitals:   08/20/21 0816  Weight: 284 lb (128.8 kg)  Height: 5\' 11"  (1.803 m)     Body mass index is 39.61 kg/m.  Generalized: Well developed, in no acute distress  Chest: Lungs clear to auscultation bilaterally  Neurological examination  Mentation: Alert oriented to time, place, history taking. Follows all commands speech and language fluent Cranial nerve II-XII: Extraocular movements were full, visual field were full on confrontational test Head turning and shoulder shrug  were normal and symmetric. Neck circumference 18 inches, Mallampati 4+ Motor: The motor testing reveals 5 over 5 strength of all 4 extremities. Good symmetric motor tone is noted throughout.  Sensory: Sensory testing is intact to soft touch on all 4 extremities. No evidence of extinction is noted.  Gait and station: Gait is normal.    DIAGNOSTIC DATA (LABS, IMAGING, TESTING) - I reviewed patient records, labs, notes, testing and imaging myself where available.  Lab Results  Component Value Date   WBC 6.2 07/13/2021   HGB 15.6 07/13/2021   HCT 45.8 07/13/2021   MCV 90 07/13/2021   PLT 199 07/13/2021      Component Value Date/Time   NA 140 07/13/2021 0938   K 4.8 07/13/2021 0938   CL 104 07/13/2021 0938   CO2 23 07/13/2021 0938   GLUCOSE 108 (H) 07/13/2021 0938   GLUCOSE 107 (H) 10/17/2020 1200   BUN 10 07/13/2021 0938   CREATININE 1.13 07/13/2021 0938   CALCIUM 10.0 07/13/2021 0938   PROT 7.0 07/13/2021 0938   ALBUMIN 4.6 07/13/2021 0938   AST 28 07/13/2021 0938   ALT 34 07/13/2021 0938   ALKPHOS 105 07/13/2021 0938   BILITOT 0.6 07/13/2021 0938   GFRNONAA >60 10/17/2020 1200   GFRAA >60 01/08/2019 1145   Lab Results  Component Value Date   CHOL 100 07/13/2021   HDL 42 07/13/2021   LDLCALC 30 07/13/2021   TRIG 167 (H) 07/13/2021   CHOLHDL 2.4 07/13/2021   Lab Results  Component Value Date  HGBA1C 5.7 (H) 07/13/2021   Lab Results  Component Value Date   TSH 2.600 07/13/2021      ASSESSMENT AND PLAN 61 y.o. year old male  has a past medical history of Acute  myocardial infarction of other anterior wall, initial episode of care, Alcohol abuse, CAD- LAD DES in setting of NSTEMI Sept 2014 (04/06/2013), Chest pain, Coronary artery disease, Displaced fracture of proximal phalanx of left little finger with routine healing (08/01/2018), Drug use, Encounter for long-term (current) use of other medications (04/06/2013), History of left hip replacement, Hypercholesteremia, Hyperlipidemia, Joint pain, Mixed hyperlipidemia (04/06/2013), Morbid obesity due to excess calories (HCC) (05/26/2015), Nondisplaced fracture of proximal phalanx of left great toe, initial encounter for closed fracture (08/01/2018), Obesity, unspecified (10/08/2012), Old MI (myocardial infarction) (04/06/2013), OSA on CPAP, Personal history of urinary calculi, Pre-diabetes, Primary osteoarthritis of left hip (09/20/2018), Primary osteoarthritis of right hip (11/21/2018), Shortness of breath, Sleep apnea, Status post total hip replacement, left (04/10/2019), Status post total replacement of left hip (01/15/2019), 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, and Umbilical hernia without obstruction and without gangrene (08/25/2020). here with:  OSA on CPAP  - CPAP compliance excellent - Good treatment of AHI  - Encourage patient to use CPAP nightly and > 4 hours each night - home sleep test ordered. Will order new machine pending sleep results.  - F/U in 1 year or sooner if needed   Butch Penny, MSN, NP-C 08/20/2021, 8:22 AM [facility] [address] [place], [place] [address] [phone]

## 2021-08-21 ENCOUNTER — Encounter: Payer: Self-pay | Admitting: Cardiology

## 2021-08-21 ENCOUNTER — Ambulatory Visit: Payer: 59 | Admitting: Cardiology

## 2021-08-21 VITALS — BP 118/78 | HR 78 | Ht 71.0 in | Wt 284.0 lb

## 2021-08-21 DIAGNOSIS — E7800 Pure hypercholesterolemia, unspecified: Secondary | ICD-10-CM | POA: Diagnosis not present

## 2021-08-21 DIAGNOSIS — Z9989 Dependence on other enabling machines and devices: Secondary | ICD-10-CM

## 2021-08-21 DIAGNOSIS — G4733 Obstructive sleep apnea (adult) (pediatric): Secondary | ICD-10-CM

## 2021-08-21 DIAGNOSIS — Z6839 Body mass index (BMI) 39.0-39.9, adult: Secondary | ICD-10-CM

## 2021-08-21 DIAGNOSIS — I2510 Atherosclerotic heart disease of native coronary artery without angina pectoris: Secondary | ICD-10-CM

## 2021-08-21 NOTE — Progress Notes (Signed)
Cardiology Office Note:    Date:  08/21/2021   ID:  [name], DOB [birth year], MRN [id number]  PCP:  Rema Fendt, NP  Cardiologist:  Gypsy Balsam, MD    Referring MD: Rema Fendt, NP   Chief Complaint  Patient presents with   Follow-up    History of Present Illness:    Wayne Jordan is a 61 y.o. male with past medical history significant for coronary artery disease, status post drug-eluting stent done in September 2014 to LAD in face of non-STEMI, essential hypertension, dyslipidemia, obstructive sleep apnea.  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  Does not have any chest pain tightness squeezing pressure burning chest.  After that we did coronary CT angio however test was uninterpretable because of motion as well as his body habitus.  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  He is doing very well he goes to gym 3 times a week as well as he swims to 2 days.  Denies have any cardiac complaints.  Recently visited neurologist conversation was about sleep apnea.  And adjustment of his equipment.  Past Medical History:  Diagnosis Date   Acute myocardial infarction of other anterior wall, initial episode of care    Alcohol abuse    CAD- LAD DES in setting of NSTEMI Sept 2014 04/06/2013   Chest pain    Coronary artery disease    s/P PCI of the LAD with DES.  Nuclear stress test 04/2014 with no ischemia   Displaced fracture of proximal phalanx of left little finger with routine healing 08/01/2018   Drug use    Encounter for long-term (current) use of other medications 04/06/2013   History of left hip replacement    Hypercholesteremia    Hyperlipidemia    Joint pain    Mixed hyperlipidemia 04/06/2013   Morbid obesity due to excess calories (HCC) 05/26/2015   Nondisplaced fracture of proximal phalanx of left great toe, initial encounter for closed fracture 08/01/2018    Obesity, unspecified 10/08/2012   Old MI (myocardial infarction) 04/06/2013   OSA on CPAP    followed by Dr. Jessie Foot   Personal history of urinary calculi    Pre-diabetes    Primary osteoarthritis of left hip 09/20/2018   Primary osteoarthritis of right hip 11/21/2018   Shortness of breath    Sleep apnea    Status post total hip replacement, left 04/10/2019   Status post total replacement of left hip 01/15/2019   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    Umbilical hernia without obstruction and without gangrene 08/25/2020    Past Surgical History:  Procedure Laterality Date   INGUINAL HERNIA REPAIR Right 03/08/2012   Procedure: HERNIA REPAIR INGUINAL ADULT;  Surgeon: Axel Filler, MD;  Location: [facility] OR;  Service: General;  Laterality: Right;   INSERTION OF MESH N/A 03/08/2012   Procedure: INSERTION OF MESH;  Surgeon: Axel Filler, MD;  Location: [facility] OR;  Service: General;  Laterality: N/A;   INSERTION OF MESH N/A 10/22/2020   Procedure: INSERTION OF MESH;  Surgeon: Fritzi Mandes, MD;  Location: [facility] OR;  Service: General;  Laterality: N/A;   LEFT HEART CATHETERIZATION WITH CORONARY ANGIOGRAM Right 10/07/2012   Procedure: LEFT HEART CATHETERIZATION WITH CORONARY ANGIOGRAM;  Surgeon: Corky Crafts, MD;  Location: [facility] CATH LAB;  Service: Cardiovascular;  Laterality: Right;   METACARPAL OSTEOTOMY Left 11/01/2018   Procedure: Left  small finger osteophyte removal;  Surgeon: Tarry Kos, MD;  Location: [facility];  Service: Orthopedics;  Laterality: Left;   PERCUTANEOUS STENT INTERVENTION  10/07/2012   Procedure: PERCUTANEOUS STENT INTERVENTION;  Surgeon: Corky Crafts, MD;  Location: [facility] CATH LAB;  Service: Cardiovascular;;   TOTAL HIP ARTHROPLASTY Left 01/15/2019   Procedure: LEFT TOTAL HIP ARTHROPLASTY ANTERIOR APPROACH;  Surgeon: Tarry Kos, MD;  Location: [facility] OR;  Service: Orthopedics;  Laterality: Left;   UMBILICAL HERNIA REPAIR N/A 10/22/2020   Procedure: OPEN  UMBILICAL HERNIA REPAIR WITH MESH;  Surgeon: Fritzi Mandes, MD;  Location: [facility] OR;  Service: General;  Laterality: N/A;    Current Medications: Current Meds  Medication Sig   amLODipine (NORVASC) 5 MG tablet Take 1 tablet (5 mg total) by mouth daily.   ascorbic acid (VITAMIN C) 500 MG tablet Take 1,000 mg by mouth daily.   aspirin EC 81 MG tablet Take 1 tablet (81 mg total) by mouth daily. Swallow whole.   atorvastatin (LIPITOR) 80 MG tablet Take 1 tablet (80 mg total) by mouth daily.   clopidogrel (PLAVIX) 75 MG tablet Take 1 tablet (75 mg total) by mouth daily.   metoprolol tartrate (LOPRESSOR) 100 MG tablet Lopressor 100mg  take 1 tablet 2 hours prior to the test   Multiple Vitamin (MULTI-VITAMIN DAILY PO) Take by mouth.   nitroGLYCERIN (NITROSTAT) 0.4 MG SL tablet Place 1 tablet (0.4 mg total) under the tongue every 5 (five) minutes x 3 doses as needed for chest pain.   Probiotic Product (PROBIOTIC DAILY PO) Take 1 tablet by mouth daily.   Vitamin D, Ergocalciferol, (DRISDOL) 1.25 MG (50000 UNIT) CAPS capsule Take 1 capsule (50,000 Units total) by mouth every 7 (seven) days.   zinc gluconate 50 MG tablet Take 50 mg by mouth daily.     Allergies:   Coconut fatty acids and Penicillins   Social History   Socioeconomic History   Marital status: Married    Spouse name: Abbie   Number of children: Not on file   Years of education: Not on file   Highest education level: Not on file  Occupational History   Occupation: Retired  Tobacco Use   Smoking status: Former    Packs/day: 1.50    Years: 7.00    Total pack years: 10.50    Types: Cigarettes    Quit date: 10/01/2012    Years since quitting: 8.8    Passive exposure: Past   Smokeless tobacco: Never  Vaping Use   Vaping Use: Never used  Substance and Sexual Activity   Alcohol use: Not Currently    Comment: occassionally   Drug use: No   Sexual activity: Yes  Other Topics Concern   Not on file  Social History Narrative    Drinks 2 cheerwines daily.   Social Determinants of Health   Financial Resource Strain: Not on file  Food Insecurity: Not on file  Transportation Needs: Not on file  Physical Activity: Not on file  Stress: Not on file  Social Connections: Not on file     Family History: The patient's family history includes CAD in his father; Heart attack in his father; Heart disease in his father. ROS:   Please see the history of present illness.    All 14 point review of systems negative except as described per history of present illness  EKGs/Labs/Other Studies Reviewed:      Recent Labs: 07/13/2021: ALT 34; BUN 10; Creatinine, Ser 1.13; Hemoglobin  15.6; Platelets 199; Potassium 4.8; Sodium 140; TSH 2.600  Recent Lipid Panel    Component Value Date/Time   CHOL 100 07/13/2021 0938   CHOL CANCELED 11/07/2014 0921   CHOL SEE BELOW 11/07/2014 0921   CHOL 128 04/26/2014 0757   TRIG 167 (H) 07/13/2021 0938   TRIG CANCELED 11/07/2014 0921   TRIG SEE BELOW 11/07/2014 0921   TRIG 140 04/26/2014 0757   HDL 42 07/13/2021 0938   HDL CANCELED 11/07/2014 0921   HDL SEE BELOW 11/07/2014 0921   HDL 39 (L) 04/26/2014 0757   CHOLHDL 2.4 07/13/2021 0938   CHOLHDL 2.6 04/18/2015 0930   VLDL 22 04/18/2015 0930   LDLCALC 30 07/13/2021 0938   LDLCALC CANCELED 11/07/2014 0921   LDLCALC SEE BELOW 11/07/2014 0921   LDLCALC 61 04/26/2014 0757    Physical Exam:    VS:  BP 118/78 (BP Location: Left Arm, Patient Position: Sitting)   Pulse 78   Ht 5\' 11"  (1.803 m)   Wt 284 lb (128.8 kg)   SpO2 93%   BMI 39.61 kg/m     Wt Readings from Last 3 Encounters:  08/21/21 284 lb (128.8 kg)  08/20/21 284 lb (128.8 kg)  08/04/21 276 lb (125.2 kg)     GEN:  Well nourished, well developed in no acute distress HEENT: Normal NECK: No JVD; No carotid bruits LYMPHATICS: No lymphadenopathy CARDIAC: RRR, no murmurs, no rubs, no gallops RESPIRATORY:  Clear to auscultation without rales, wheezing or rhonchi   ABDOMEN: Soft, non-tender, non-distended MUSCULOSKELETAL:  No edema; No deformity  SKIN: Warm and dry LOWER EXTREMITIES: no swelling NEUROLOGIC:  Alert and oriented x 3 PSYCHIATRIC:  Normal affect   ASSESSMENT:    1. Coronary artery disease involving native coronary artery of native heart without angina pectoris   2. OSA on CPAP   3. Hypercholesteremia   4. Class 2 severe obesity with serious comorbidity and body mass index (BMI) of 39.0 to 39.9 in adult, unspecified obesity type (HCC)    PLAN:    In order of problems listed above:  Coronary disease.  [name] seems to be asymptomatic on appropriate medications which I will continue.  We spent great deal of time talking about risk factors modifications.  He exercised on the regular basis will continue doing the Obstructive sleep apnea: Use CPAP mask on the regular basis about there was some adjustment to his equipment being done by neurology service. Dyslipidemia I did review his K PN which show me his LDL of 30 HDL 42.  We will continue present management.  He is tolerating this therapy well Obesity still a problem.  He is trying to exercise hopefully will be able to lose some weight.   Medication Adjustments/Labs and Tests Ordered: Current medicines are reviewed at length with the patient today.  Concerns regarding medicines are outlined above.  No orders of the defined types were placed in this encounter.  Medication changes: No orders of the defined types were placed in this encounter.   Signed, 08/06/21, MD, [facility] 08/21/2021 10:24 AM    [facility]

## 2021-08-21 NOTE — Patient Instructions (Signed)

## 2021-08-26 ENCOUNTER — Encounter (INDEPENDENT_AMBULATORY_CARE_PROVIDER_SITE_OTHER): Payer: Self-pay

## 2021-08-31 ENCOUNTER — Other Ambulatory Visit: Payer: Self-pay

## 2021-08-31 ENCOUNTER — Telehealth: Payer: Self-pay | Admitting: Family

## 2021-08-31 DIAGNOSIS — Z1211 Encounter for screening for malignant neoplasm of colon: Secondary | ICD-10-CM

## 2021-08-31 NOTE — Telephone Encounter (Signed)
Cologuard ordered. Pt contacted

## 2021-08-31 NOTE — Telephone Encounter (Signed)
Pt was told to call the office and order a cologuard test.   CB@  [phone]

## 2021-08-31 NOTE — Progress Notes (Signed)
Pt contacted to confirm that prefers to complete Cologuard for his colon cancer screening, 07/13/21- provider notates that pt will discuss Cologuard vs Colonoscopy, pt address verified, Cologuard ordered for pt

## 2021-09-01 ENCOUNTER — Encounter (INDEPENDENT_AMBULATORY_CARE_PROVIDER_SITE_OTHER): Payer: Self-pay | Admitting: Bariatrics

## 2021-09-01 ENCOUNTER — Ambulatory Visit (INDEPENDENT_AMBULATORY_CARE_PROVIDER_SITE_OTHER): Payer: 59 | Admitting: Bariatrics

## 2021-09-01 VITALS — BP 129/85 | HR 92 | Temp 97.5°F | Ht 71.0 in | Wt 275.0 lb

## 2021-09-01 DIAGNOSIS — E559 Vitamin D deficiency, unspecified: Secondary | ICD-10-CM | POA: Diagnosis not present

## 2021-09-01 DIAGNOSIS — E669 Obesity, unspecified: Secondary | ICD-10-CM | POA: Diagnosis not present

## 2021-09-01 DIAGNOSIS — Z6838 Body mass index (BMI) 38.0-38.9, adult: Secondary | ICD-10-CM

## 2021-09-01 DIAGNOSIS — R7303 Prediabetes: Secondary | ICD-10-CM | POA: Diagnosis not present

## 2021-09-02 MED ORDER — VITAMIN D (ERGOCALCIFEROL) 1.25 MG (50000 UNIT) PO CAPS: 50000.0000 [IU] | ORAL_CAPSULE | ORAL | 0 refills | Status: DC

## 2021-09-02 NOTE — Progress Notes (Signed)
Chief Complaint:   OBESITY Wayne Jordan is here to discuss his progress with his obesity treatment plan along with follow-up of his obesity related diagnoses. Wayne Jordan is on the Category 4 Plan and keeping a food journal and adhering to recommended goals of 1700 calories and 90-100 gms protein and states he is following his eating plan approximately 25% of the time. Wayne Jordan states he is exercising at the gym for 60 minutes 3 times per week and swimming for 60 minutes 2 times per week.  Today's visit was #: 5 Starting weight: 282 lbs Starting date: 05/21/2021 Today's weight: 275 lbs Today's date: 09/01/21 Total lbs lost to date: 7 Total lbs lost since last in-office visit: -1  Interim History: He is down 1 pound since his last visit. He has had several celebrations.  He is doing better with water intake.  Subjective:   1. Vitamin D insufficiency Taking vitamin D.  2. Prediabetes No medication.  Assessment/Plan:   1. Vitamin D insufficiency Refill vitamin D 50,000 IU once weekly #8, no refills.  2. Prediabetes 1.  Will keep carbohydrates low (sugars and starches). 2.  Will exchange water for Cheer Wine and keep water intake high.  3. Obesity, Current BMI 38.5 1.  Will adhere to the plan 80 to 90%. 2.  Intentional eating 3.  Will keep water and protein high  Wayne Jordan is currently in the action stage of change. As such, his goal is to continue with weight loss efforts. He has agreed to the Category 4 Plan.   Exercise goals: Going to the gym and swimming, strength training.  Behavioral modification strategies: increasing lean protein intake, decreasing simple carbohydrates, increasing vegetables, increasing water intake, decreasing eating out, no skipping meals, meal planning and cooking strategies, keeping healthy foods in the home, and planning for success.  Wayne Jordan has agreed to follow-up with our clinic in 3 weeks with Dr. Cathey Endow.  He was informed of the importance of frequent  follow-up visits to maximize his success with intensive lifestyle modifications for his multiple health conditions.   Objective:   Blood pressure 129/85, pulse 92, temperature (!) 97.5 F (36.4 C), height 5\' 11"  (1.803 m), weight 275 lb (124.7 kg), SpO2 92 %. Body mass index is 38.35 kg/m.  General: Cooperative, alert, well developed, in no acute distress. HEENT: Conjunctivae and lids unremarkable. Cardiovascular: Regular rhythm.  Lungs: Normal work of breathing. Neurologic: No focal deficits.   Lab Results  Component Value Date   CREATININE 1.13 07/13/2021   BUN 10 07/13/2021   NA 140 07/13/2021   K 4.8 07/13/2021   CL 104 07/13/2021   CO2 23 07/13/2021   Lab Results  Component Value Date   ALT 34 07/13/2021   AST 28 07/13/2021   ALKPHOS 105 07/13/2021   BILITOT 0.6 07/13/2021   Lab Results  Component Value Date   HGBA1C 5.7 (H) 07/13/2021   HGBA1C 5.8 (H) 07/09/2020   HGBA1C 5.6 10/07/2012   Lab Results  Component Value Date   INSULIN 22.2 05/21/2021   Lab Results  Component Value Date   TSH 2.600 07/13/2021   Lab Results  Component Value Date   CHOL 100 07/13/2021   HDL 42 07/13/2021   LDLCALC 30 07/13/2021   TRIG 167 (H) 07/13/2021   CHOLHDL 2.4 07/13/2021   Lab Results  Component Value Date   VD25OH 24.4 (L) 05/21/2021   Lab Results  Component Value Date   WBC 6.2 07/13/2021   HGB 15.6 07/13/2021  HCT 45.8 07/13/2021   MCV 90 07/13/2021   PLT 199 07/13/2021   No results found for: "IRON", "TIBC", "FERRITIN"   Attestation Statements:   Reviewed by clinician on day of visit: allergies, medications, problem list, medical history, surgical history, family history, social history, and previous encounter notes.  I, Dawn Whitmire, FNP-C, am acting as transcriptionist for Dr. Corinna Capra.  I have reviewed the above documentation for accuracy and completeness, and I agree with the above. Corinna Capra, DO

## 2021-09-07 ENCOUNTER — Encounter (INDEPENDENT_AMBULATORY_CARE_PROVIDER_SITE_OTHER): Payer: Self-pay | Admitting: Bariatrics

## 2021-09-07 ENCOUNTER — Telehealth: Payer: Self-pay

## 2021-09-07 MED ORDER — CYCLOBENZAPRINE HCL 5 MG PO TABS: 5.0000 mg | ORAL_TABLET | Freq: Three times a day (TID) | ORAL | 0 refills | Status: DC | PRN

## 2021-09-07 NOTE — Telephone Encounter (Signed)
Pt complaining of leg spasms. Flexeril 5mg  #21 tid PRN spasms, sent to [facility] [address], [place].

## 2021-09-23 ENCOUNTER — Encounter (INDEPENDENT_AMBULATORY_CARE_PROVIDER_SITE_OTHER): Payer: Self-pay | Admitting: Family Medicine

## 2021-09-23 ENCOUNTER — Ambulatory Visit (INDEPENDENT_AMBULATORY_CARE_PROVIDER_SITE_OTHER): Payer: 59 | Admitting: Family Medicine

## 2021-09-23 VITALS — BP 137/78 | HR 70 | Temp 98.0°F | Ht 71.0 in | Wt 280.0 lb

## 2021-09-23 DIAGNOSIS — Z96642 Presence of left artificial hip joint: Secondary | ICD-10-CM

## 2021-09-23 DIAGNOSIS — G4733 Obstructive sleep apnea (adult) (pediatric): Secondary | ICD-10-CM | POA: Diagnosis not present

## 2021-09-23 DIAGNOSIS — Z9989 Dependence on other enabling machines and devices: Secondary | ICD-10-CM

## 2021-09-23 DIAGNOSIS — E669 Obesity, unspecified: Secondary | ICD-10-CM

## 2021-09-23 DIAGNOSIS — Z6839 Body mass index (BMI) 39.0-39.9, adult: Secondary | ICD-10-CM

## 2021-09-23 DIAGNOSIS — E559 Vitamin D deficiency, unspecified: Secondary | ICD-10-CM

## 2021-09-23 DIAGNOSIS — R7303 Prediabetes: Secondary | ICD-10-CM

## 2021-09-24 ENCOUNTER — Telehealth: Payer: Self-pay | Admitting: Adult Health

## 2021-09-24 NOTE — Telephone Encounter (Signed)
HST- [facility] no auth req ref # [id number] on 09/23/21.  Patient is scheduled at [facility] for 10/20/21 at 8:30 AM.  Mailed packet to the patient.

## 2021-09-29 NOTE — Progress Notes (Signed)
Chief Complaint:   OBESITY Wayne Jordan is here to discuss his progress with his obesity treatment plan along with follow-up of his obesity related diagnoses. Wayne Jordan is on the Category 4 Plan and states he is following his eating plan approximately 15-20% of the time. Wayne Jordan states he is at the gym 40-45 minutes 3.-5 times per week.  Today's visit was #: 6 Starting weight: 282 lbs Starting date: 05/21/2021 Today's weight: 280 lbs Today's date: 09/23/2021 Total lbs lost to date: 0 Total lbs lost since last in-office visit: 0  Interim History: Celebrated and had a lot of treats and eating out last weekend.  Lacking much veggies.  Eats pasta and rice with dinners. Still drinking Cheerwine and started water exercise 2 times per week.  Using smaller plates, wife is supportive. Mindful of snacks.   Subjective:   1. Vitamin D deficiency He is currently taking prescription vitamin D 50,000 IU each week. He denies nausea, vomiting or muscle weakness.  2. Status post total hip replacement, left Left hip pain worsening with weight gain and increased sugar intake.   3. Prediabetes 07/13/21, A1c 5.7,  05/21/2021 fasting insulin 22.2. Still consuming excess carbs and sugar.  4. OSA on CPAP Wearing CPAP nightly. Net weight loss  2 lbs in 4 months.    Assessment/Plan:   1. Vitamin D deficiency Plan to recheck Vitamin D level on next visit.   2. Status post total hip replacement, left Continue working on weight loss.   3. Prediabetes Plan to recheck A1c and fasting insulin at next office visit.  4. OSA on CPAP Continue CPAP nightly.   5. Obesity, current BMI 39.1 Wayne Jordan is currently in the action stage of change. As such, his goal is to continue with weight loss efforts. He has agreed to the Category 4 Plan+ 110 protein daily.   Exercise goals:  water exercises and weight training 4-5 days per week.   Behavioral modification strategies: increasing lean protein intake, increasing  vegetables, increasing water intake, decreasing liquid calories, decreasing eating out, no skipping meals, keeping healthy foods in the home, and decreasing junk food.  Wayne Jordan has agreed to follow-up with our clinic in 3 weeks. He was informed of the importance of frequent follow-up visits to maximize his success with intensive lifestyle modifications for his multiple health conditions.   Objective:   Blood pressure 137/78, pulse 70, temperature 98 F (36.7 C), height 5\' 11"  (1.803 m), weight 280 lb (127 kg), SpO2 96 %. Body mass index is 39.05 kg/m.  General: Cooperative, alert, well developed, in no acute distress. HEENT: Conjunctivae and lids unremarkable. Cardiovascular: Regular rhythm.  Lungs: Normal work of breathing. Neurologic: No focal deficits.   Lab Results  Component Value Date   CREATININE 1.13 07/13/2021   BUN 10 07/13/2021   NA 140 07/13/2021   K 4.8 07/13/2021   CL 104 07/13/2021   CO2 23 07/13/2021   Lab Results  Component Value Date   ALT 34 07/13/2021   AST 28 07/13/2021   ALKPHOS 105 07/13/2021   BILITOT 0.6 07/13/2021   Lab Results  Component Value Date   HGBA1C 5.7 (H) 07/13/2021   HGBA1C 5.8 (H) 07/09/2020   HGBA1C 5.6 10/07/2012   Lab Results  Component Value Date   INSULIN 22.2 05/21/2021   Lab Results  Component Value Date   TSH 2.600 07/13/2021   Lab Results  Component Value Date   CHOL 100 07/13/2021   HDL 42 07/13/2021   LDLCALC  30 07/13/2021   TRIG 167 (H) 07/13/2021   CHOLHDL 2.4 07/13/2021   Lab Results  Component Value Date   VD25OH 24.4 (L) 05/21/2021   Lab Results  Component Value Date   WBC 6.2 07/13/2021   HGB 15.6 07/13/2021   HCT 45.8 07/13/2021   MCV 90 07/13/2021   PLT 199 07/13/2021   No results found for: "IRON", "TIBC", "FERRITIN"   Attestation Statements:   Reviewed by clinician on day of visit: allergies, medications, problem list, medical history, surgical history, family history, social history, and  previous encounter notes.  I, Malcolm Metro, am acting as Energy manager for Seymour Bars, DO.  I have reviewed the above documentation for accuracy and completeness, and I agree with the above. Glennis Brink, DO

## 2021-10-09 LAB — COLOGUARD: COLOGUARD: NEGATIVE

## 2021-10-15 ENCOUNTER — Ambulatory Visit (INDEPENDENT_AMBULATORY_CARE_PROVIDER_SITE_OTHER): Payer: 59 | Admitting: Family Medicine

## 2021-10-15 ENCOUNTER — Encounter (INDEPENDENT_AMBULATORY_CARE_PROVIDER_SITE_OTHER): Payer: Self-pay | Admitting: Family Medicine

## 2021-10-15 VITALS — BP 125/80 | HR 94 | Temp 98.2°F | Ht 71.0 in | Wt 280.0 lb

## 2021-10-15 DIAGNOSIS — R7303 Prediabetes: Secondary | ICD-10-CM | POA: Diagnosis not present

## 2021-10-15 DIAGNOSIS — E669 Obesity, unspecified: Secondary | ICD-10-CM

## 2021-10-15 DIAGNOSIS — E559 Vitamin D deficiency, unspecified: Secondary | ICD-10-CM | POA: Diagnosis not present

## 2021-10-15 DIAGNOSIS — Z9189 Other specified personal risk factors, not elsewhere classified: Secondary | ICD-10-CM | POA: Insufficient documentation

## 2021-10-15 DIAGNOSIS — Z6839 Body mass index (BMI) 39.0-39.9, adult: Secondary | ICD-10-CM | POA: Diagnosis not present

## 2021-10-15 MED ORDER — VITAMIN D (ERGOCALCIFEROL) 1.25 MG (50000 UNIT) PO CAPS: 50000.0000 [IU] | ORAL_CAPSULE | ORAL | 0 refills | Status: DC

## 2021-10-20 ENCOUNTER — Ambulatory Visit: Payer: 59 | Admitting: Neurology

## 2021-10-20 DIAGNOSIS — G4733 Obstructive sleep apnea (adult) (pediatric): Secondary | ICD-10-CM

## 2021-10-20 DIAGNOSIS — I25110 Atherosclerotic heart disease of native coronary artery with unstable angina pectoris: Secondary | ICD-10-CM

## 2021-10-22 NOTE — Procedures (Signed)
[facility] "Wayne Jordan"   HOME SLEEP TEST REPORT ( by Watch PAT)   STUDY DATA:  10-22-2021 DOB:  [birth year]    ORDERING CLINICIAN: Melvyn Novas, MD  REFERRING CLINICIAN: Butch Penny, NP    CLINICAL INFORMATION/HISTORY: 08/20/21:   Wayne Jordan is a 62 year old male with a history of obstructive sleep apnea on CPAP.Hx of CAD, MI, Morbid Obesity, umbilical hernia. He uses a Veterinary surgeon  and was not informed of recall or replacement . Reports that he never got a new machine after the recall. Straps are too lose he had to have his daughter sew the straps. ( not clear which interface he uses) . The 95% pressure is 13 cm water. Airleak 16 l/m. AHI 1.3/h. Age of this machine: 02-2015. Wayne Jordan had a PSG and full night CPAP titration in February 2017, BMI was 39 at that time (!) . AHI at  was 12/h. REM AHI 21/h. He had major oxygen desaturation. All resolved under 13 cm water pressure.      Epworth sleepiness score: 4/24.   BMI: 40 kg/m   Neck Circumference:18 inches   FINDINGS:   Sleep Summary:   Total Recording Time (hours, min): 7 hours and 49 minutes of which the total sleep time amounted to 6 hours and 54 minutes with a REM sleep proportion of 13%.  Sleep latency was 17 minutes.  REM sleep latency was 190 minutes.                               Respiratory Indices:   Calculated pAHI (per hour):    52.9/h                         REM pAHI:    47.2/h                                             NREM pAHI:   53.8/h                          Positional AHI:   The patient slept about 40% of the night in supine position associated with an AHI of 74/h followed by 40% in right lateral sleep with an AHI of 36/h.  Snoring reached a mean volume of 43 dB and was present for 70% of total sleep time.                                                Oxygen Saturation Statistics:   O2 Saturation Range (%): Between a nadir at 84% and a maximum saturation  at 98%, the mean oxygen saturation was 92%.                                     O2 Saturation (minutes) <89%:    2.3 minutes       Pulse Rate Statistics:   Pulse Mean (bpm): 69 bpm               Pulse Range: Between  57 and 107 bpm.                IMPRESSION:  This HST confirms the presence of severe sleep apnea and a contrast to his almost 61 year old studies. RECOMMENDATION: The patient is urged to continue with positive airway pressure.  I will also prescribe a an autotitration CPAP device, and a mask of his choice with heated humidification and the settings for the new machine shall be 6 through 16 cmH2O either using a Philips Respironics C flex -CPAP or a ResMed device with a 3 cm EPR.      INTERPRETING PHYSICIAN:   Larey Seat, MD   Medical Director of [facility] at [facility].

## 2021-10-22 NOTE — Progress Notes (Signed)
    [facility] Wayne [name]   HOME SLEEP TEST REPORT ( by Watch PAT)   STUDY DATA:  10-22-2021 DOB:  04/25/1960    ORDERING CLINICIAN: Carmen Dohmeier, MD  REFERRING CLINICIAN: Megan Millikan, NP    CLINICAL INFORMATION/HISTORY: 08/20/21:   [name] is a 60-year-old male with a history of obstructive sleep apnea on CPAP.Hx of CAD, MI, Morbid Obesity, umbilical hernia. He uses a Phillips machine  and was not informed of recall or replacement . Reports that he never got a new machine after the recall. Straps are too lose he had to have his daughter sew the straps. ( not clear which interface he uses) . The 95% pressure is 13 cm water. Airleak 16 l/m. AHI 1.3/h. Age of this machine: 02-2015. [name] in February 2017, BMI was 39 at that time (!) . AHI at  was 12/h. REM AHI 21/h. He had major oxygen desaturation. All resolved under 13 cm water pressure.      Epworth sleepiness score: 4/24.   BMI: 40 kg/m   Neck Circumference:18 inches   FINDINGS:   Sleep Summary:   Total Recording Time (hours, min): 7 hours and 49 minutes of which the total sleep time amounted to 6 hours and 54 minutes with a REM sleep proportion of 13%.  Sleep latency was 17 minutes.  REM sleep latency was 190 minutes.                               Respiratory Indices:   Calculated pAHI (per hour):    52.9/h                         REM pAHI:    47.2/h                                             NREM pAHI:   53.8/h                          Positional AHI:   The patient slept about 40% of the night in supine position associated with an AHI of 74/h followed by 40% in right lateral sleep with an AHI of 36/h.  Snoring reached a mean volume of 43 dB and was present for 70% of total sleep time.                                                Oxygen Saturation Statistics:   O2 Saturation Range (%): Between a nadir at 84% and a maximum saturation  at 98%, the mean oxygen saturation was 92%.                                     O2 Saturation (minutes) <89%:    2.3 minutes       Pulse Rate Statistics:   Pulse Mean (bpm): 69 bpm               Pulse Range: Between   Pulse Range: Between 57 and 107 bpm.                IMPRESSION:  This HST confirms the presence of severe sleep apnea and a contrast to his almost 61 year old studies. RECOMMENDATION: The patient is urged to continue with positive airway pressure.  I will also prescribe a an autotitration CPAP device, and a mask of his choice with heated humidification and the settings for the new machine shall be 6 through 16 cmH2O either using a Philips Respironics C flex -CPAP or a ResMed device with a 3 cm EPR.      INTERPRETING PHYSICIAN:   Melvyn Novas, MD   Medical Director of [facility] at [facility].

## 2021-10-22 NOTE — Progress Notes (Signed)
Chief Complaint:   OBESITY Wayne Jordan is here to discuss his progress with his obesity treatment plan along with follow-up of his obesity related diagnoses. [name] is on the Category 4 Plan with 110 grams protein and states he is following his eating plan approximately 10% of the time. [name] states he is swimming and gym 45 minutes 2-3 times per week.  Today's visit was #: 7 Starting weight: 282 lbs Starting date: 05/21/2021 Today's weight: 280 lbs Today's date: 10/15/2021 Total lbs lost to date: 2 Total lbs lost since last in-office visit: 0  Interim History: Lately, the family has been preparing for the wedding of their daughter. Pt has also had a couple of vacations and had lots of family in town. [name] is retired and his wife works at [employer].  Subjective:   1. Prediabetes [name] has been focused on lower carb and increasing protein intake. Medication: None  2. Vitamin D insufficiency He is currently taking prescription vitamin D 50,000 IU each week. He denies nausea, vomiting or muscle weakness.  3. At risk for diabetes mellitus Sie is at risk for diabetes mellitus due to pre-diabetes.  Assessment/Plan:  No orders of the defined types were placed in this encounter.   Medications Discontinued During This Encounter  Medication Reason   Vitamin D, Ergocalciferol, (DRISDOL) 1.25 MG (50000 UNIT) CAPS capsule Reorder     Meds ordered this encounter  Medications   Vitamin D, Ergocalciferol, (DRISDOL) 1.25 MG (50000 UNIT) CAPS capsule    Sig: Take 1 capsule (50,000 Units total) by mouth every 7 (seven) days.    Dispense:  8 capsule    Refill:  0     1. Prediabetes [name] will continue to work on prudent nutritional plan, weight loss, exercise, and decreasing simple carbohydrates to help decrease the risk of diabetes.   2. Vitamin D insufficiency Low Vitamin D level contributes to fatigue and are associated with obesity, breast, and colon cancer. He agrees to continue to take  prescription Vitamin D @50 ,000 IU every week and will follow-up for routine testing of Vitamin D, at least 2-3 times per year to avoid over-replacement.  Refill- Vitamin D, Ergocalciferol, (DRISDOL) 1.25 MG (50000 UNIT) CAPS capsule; Take 1 capsule (50,000 Units total) by mouth every 7 (seven) days.  Dispense: 8 capsule; Refill: 0  3. At risk for diabetes mellitus - Navarro was given diabetes prevention education and counseling today of more than 15 minutes.  - Counseled patient on pathophysiology of disease and meaning/ implication of lab results.  - Reviewed how certain foods can either stimulate or inhibit insulin release, and subsequently affect hunger pathways  - Importance of following a healthy meal plan with limiting amounts of simple carbohydrates discussed with patient - Effects of regular aerobic exercise on blood sugar regulation reviewed and encouraged an eventual goal of 30 min 5d/week or more as a minimum.  - Briefly discussed treatment options, which always include dietary and lifestyle modification as first line.   - Handouts provided at patient's desire and/or told to go online to the American Diabetes Association website for further information.  4. Obesity, current BMI 39.1 Harley is currently in the action stage of change. As such, his goal is to continue with weight loss efforts. He has agreed to change to keeping a food journal and adhering to recommended goals of 1850-1950 calories and 150+ grams protein.   Exercise goals:  As is  Behavioral modification strategies: increasing lean protein intake, decreasing simple carbohydrates, and  planning for success.  [name] has agreed to follow-up with our clinic in 3 weeks. He was informed of the importance of frequent follow-up visits to maximize his success with intensive lifestyle modifications for his multiple health conditions.   Objective:   Blood pressure 125/80, pulse 94, temperature 98.2 F (36.8 C), height 5\' 11"  (1.803  m), weight 280 lb (127 kg), SpO2 94 %. Body mass index is 39.05 kg/m.  General: Cooperative, alert, well developed, in no acute distress. HEENT: Conjunctivae and lids unremarkable. Cardiovascular: Regular rhythm.  Lungs: Normal work of breathing. Neurologic: No focal deficits.   Lab Results  Component Value Date   CREATININE 1.13 07/13/2021   BUN 10 07/13/2021   NA 140 07/13/2021   K 4.8 07/13/2021   CL 104 07/13/2021   CO2 23 07/13/2021   Lab Results  Component Value Date   ALT 34 07/13/2021   AST 28 07/13/2021   ALKPHOS 105 07/13/2021   BILITOT 0.6 07/13/2021   Lab Results  Component Value Date   HGBA1C 5.7 (H) 07/13/2021   HGBA1C 5.8 (H) 07/09/2020   HGBA1C 5.6 10/07/2012   Lab Results  Component Value Date   INSULIN 22.2 05/21/2021   Lab Results  Component Value Date   TSH 2.600 07/13/2021   Lab Results  Component Value Date   CHOL 100 07/13/2021   HDL 42 07/13/2021   LDLCALC 30 07/13/2021   TRIG 167 (H) 07/13/2021   CHOLHDL 2.4 07/13/2021   Lab Results  Component Value Date   VD25OH 24.4 (L) 05/21/2021   Lab Results  Component Value Date   WBC 6.2 07/13/2021   HGB 15.6 07/13/2021   HCT 45.8 07/13/2021   MCV 90 07/13/2021   PLT 199 07/13/2021   Attestation Statements:   Reviewed by clinician on day of visit: allergies, medications, problem list, medical history, surgical history, family history, social history, and previous encounter notes.  I, 07/15/2021, BS, CMA, am acting as transcriptionist for Kyung Rudd, DO.  I have reviewed the above documentation for accuracy and completeness, and I agree with the above. [employer], D.O.  The 21st Century Cures Act was signed into law in 2016 which includes the topic of electronic health records.  This provides immediate access to information in MyChart.  This includes consultation notes, operative notes, office notes, lab results and pathology reports.  If you have any questions about  what you read please let 2017 know at your next visit so we can discuss your concerns and take corrective action if need be.  We are right here with you.

## 2021-10-26 NOTE — Addendum Note (Signed)
Addended by: Trudie Buckler on: 10/26/2021 11:33 AM   Modules accepted: Orders

## 2021-11-12 ENCOUNTER — Ambulatory Visit (INDEPENDENT_AMBULATORY_CARE_PROVIDER_SITE_OTHER): Payer: 59 | Admitting: Family Medicine

## 2021-11-12 ENCOUNTER — Other Ambulatory Visit (INDEPENDENT_AMBULATORY_CARE_PROVIDER_SITE_OTHER): Payer: Self-pay | Admitting: Family Medicine

## 2021-11-12 ENCOUNTER — Encounter (INDEPENDENT_AMBULATORY_CARE_PROVIDER_SITE_OTHER): Payer: Self-pay | Admitting: Family Medicine

## 2021-11-12 ENCOUNTER — Other Ambulatory Visit (HOSPITAL_COMMUNITY): Payer: Self-pay

## 2021-11-12 ENCOUNTER — Other Ambulatory Visit (INDEPENDENT_AMBULATORY_CARE_PROVIDER_SITE_OTHER): Payer: Self-pay

## 2021-11-12 VITALS — BP 122/83 | HR 75 | Temp 98.0°F | Ht 71.0 in | Wt 278.0 lb

## 2021-11-12 DIAGNOSIS — Z6838 Body mass index (BMI) 38.0-38.9, adult: Secondary | ICD-10-CM | POA: Diagnosis not present

## 2021-11-12 DIAGNOSIS — E669 Obesity, unspecified: Secondary | ICD-10-CM

## 2021-11-12 DIAGNOSIS — E559 Vitamin D deficiency, unspecified: Secondary | ICD-10-CM

## 2021-11-13 ENCOUNTER — Other Ambulatory Visit (HOSPITAL_COMMUNITY): Payer: Self-pay

## 2021-11-24 NOTE — Progress Notes (Signed)
Chief Complaint:   OBESITY Wayne Jordan is here to discuss his progress with his obesity treatment plan along with follow-up of his obesity related diagnoses. Wayne Jordan is on keeping a food journal and adhering to recommended goals of 1850-1950 calories and 150+ grams of protein and states he is following his eating plan approximately 20% of the time. Wayne Jordan states he is swimming/weights 60 minutes 5 times per week.  Today's visit was #: 8 Starting weight: 282 lbs Starting date: 05/21/2021 Today's weight: 278 lbs Today's date: 11/12/2021 Total lbs lost to date: 4 lbs Total lbs lost since last in-office visit: 2  Interim History: Wayne Jordan had daughters wedding 2 weekends ago. He did well and did not gain weight since last office visit. He did not journal at all. He was not able to open it up. Doing great with exercise.   Subjective:   1. Vitamin D deficiency Wayne Jordan is tolerating medication(s) well without side effects.  Medication compliance is good as patient endorses taking it as prescribed.  The patient denies additional concerns regarding this condition.        Assessment/Plan:  No orders of the defined types were placed in this encounter.   There are no discontinued medications.   No orders of the defined types were placed in this encounter.    1. Vitamin D deficiency Continue to take Vit D (ergocalciferol) weekly.  2. Obesity, current BMI 38.9 Wayne Jordan is currently in the action stage of change. As such, his goal is to continue with weight loss efforts. He has agreed to keeping a food journal and adhering to recommended goals of 1850-1950 calories and 150+ grams of protein.   Exercise goals: As is.  Behavioral modification strategies: increasing lean protein intake, planning for success, and keeping a strict food journal.  Wayne Jordan was shown how to use Lose it and log foods. I loaded App on his phone.  Wayne Jordan has agreed to follow-up with our clinic in 4 weeks. He was informed  of the importance of frequent follow-up visits to maximize his success with intensive lifestyle modifications for his multiple health conditions.   Objective:   Blood pressure 122/83, pulse 75, temperature 98 F (36.7 C), height 5\' 11"  (1.803 m), weight 278 lb (126.1 kg), SpO2 94 %. Body mass index is 38.77 kg/m.  General: Cooperative, alert, well developed, in no acute distress. HEENT: Conjunctivae and lids unremarkable. Cardiovascular: Regular rhythm.  Lungs: Normal work of breathing. Neurologic: No focal deficits.   Lab Results  Component Value Date   CREATININE 1.13 07/13/2021   BUN 10 07/13/2021   NA 140 07/13/2021   K 4.8 07/13/2021   CL 104 07/13/2021   CO2 23 07/13/2021   Lab Results  Component Value Date   ALT 34 07/13/2021   AST 28 07/13/2021   ALKPHOS 105 07/13/2021   BILITOT 0.6 07/13/2021   Lab Results  Component Value Date   HGBA1C 5.7 (H) 07/13/2021   HGBA1C 5.8 (H) 07/09/2020   HGBA1C 5.6 10/07/2012   Lab Results  Component Value Date   INSULIN 22.2 05/21/2021   Lab Results  Component Value Date   TSH 2.600 07/13/2021   Lab Results  Component Value Date   CHOL 100 07/13/2021   HDL 42 07/13/2021   LDLCALC 30 07/13/2021   TRIG 167 (H) 07/13/2021   CHOLHDL 2.4 07/13/2021   Lab Results  Component Value Date   VD25OH 24.4 (L) 05/21/2021   Lab Results  Component Value Date  WBC 6.2 07/13/2021   HGB 15.6 07/13/2021   HCT 45.8 07/13/2021   MCV 90 07/13/2021   PLT 199 07/13/2021   No results found for: "IRON", "TIBC", "FERRITIN"  Attestation Statements:   Reviewed by clinician on day of visit: allergies, medications, problem list, medical history, surgical history, family history, social history, and previous encounter notes.  Time spent on visit including pre-visit chart review and post-visit care and charting was 30 minutes.   I, Brendell Tyus, am acting as Location manager for [employer], DO.   I have reviewed the above  documentation for accuracy and completeness, and I agree with the above. Marjory Sneddon, D.O.  The [place] was signed into law in 2016 which includes the topic of electronic health records.  This provides immediate access to information in MyChart.  This includes consultation notes, operative notes, office notes, lab results and pathology reports.  If you have any questions about what you read please let us know at your next visit so we can discuss your concerns and take corrective action if need be.  We are right here with you.

## 2021-12-11 ENCOUNTER — Other Ambulatory Visit (INDEPENDENT_AMBULATORY_CARE_PROVIDER_SITE_OTHER): Payer: Self-pay | Admitting: Family Medicine

## 2021-12-11 DIAGNOSIS — E559 Vitamin D deficiency, unspecified: Secondary | ICD-10-CM

## 2021-12-14 ENCOUNTER — Telehealth: Payer: Self-pay | Admitting: Adult Health

## 2021-12-14 NOTE — Telephone Encounter (Signed)
I called pt.  He was thinking that he was going to get a replacement for his machine, and not be responsible for the cost of it.  I relayed that recalled phillips respironic machines (if his was one of them) would be replaced.  He would need to go thru the process of registering his machine and I was able to find # for him to call and do this.  The order was not for recalled machine but a new machine (and that would be thru his insurance).  The [employer] gave this [id number].  He will call and proceed from there.  If he has additional questions I told him to call back.

## 2021-12-14 NOTE — Telephone Encounter (Signed)
Pt has been told by his DME that although his CPAP which is over 63 years old he will still have to pay a $200.00 amount for the set up and then and additional $400.00 months later for the CPAP, pt is asking for a call to clear up this information he is being given, pt was never told  about out of pocket expense.

## 2021-12-16 ENCOUNTER — Encounter (INDEPENDENT_AMBULATORY_CARE_PROVIDER_SITE_OTHER): Payer: Self-pay | Admitting: Family Medicine

## 2021-12-16 ENCOUNTER — Ambulatory Visit (INDEPENDENT_AMBULATORY_CARE_PROVIDER_SITE_OTHER): Payer: 59 | Admitting: Family Medicine

## 2021-12-16 ENCOUNTER — Other Ambulatory Visit (HOSPITAL_COMMUNITY): Payer: Self-pay

## 2021-12-16 VITALS — BP 141/79 | HR 71 | Temp 97.4°F | Ht 71.0 in | Wt 282.0 lb

## 2021-12-16 DIAGNOSIS — R7303 Prediabetes: Secondary | ICD-10-CM

## 2021-12-16 DIAGNOSIS — E559 Vitamin D deficiency, unspecified: Secondary | ICD-10-CM

## 2021-12-16 DIAGNOSIS — Z6839 Body mass index (BMI) 39.0-39.9, adult: Secondary | ICD-10-CM

## 2021-12-16 DIAGNOSIS — E669 Obesity, unspecified: Secondary | ICD-10-CM

## 2021-12-16 MED ORDER — VITAMIN D (ERGOCALCIFEROL) 1.25 MG (50000 UNIT) PO CAPS
50000.0000 [IU] | ORAL_CAPSULE | ORAL | 0 refills | Status: DC
  Filled 2021-12-16: qty 4, 28d supply, fill #0

## 2021-12-28 NOTE — Progress Notes (Unsigned)
Chief Complaint:   OBESITY Wayne Jordan is here to discuss his progress with his obesity treatment plan along with follow-up of his obesity related diagnoses. Wayne Jordan is on keeping a food journal and adhering to recommended goals of 1850-1950 calories and 150+ grams of protein and states he is following his eating plan approximately 50% of the time. Wayne Jordan states he is going to the gym for 45-60 minutes 3 times per week.  Today's visit was #: 9 Starting weight: 282 lbs Starting date: 05/21/2021 Today's weight: 282 lbs Today's date: 12/16/2021 Total lbs lost to date: 0 Total lbs lost since last in-office visit: 0  Interim History: Wayne Jordan was changed to journaling but he is struggling to meet his protein goal and meal plan around his family.   Subjective:   1. Vitamin D insufficiency Wayne Jordan is on Vitamin D, and his level is not yet at goal. He denies nausea, vomiting, or muscle weakness.   2. Prediabetes Wayne Jordan is working on his weight loss, but his elevated insulin is making weight loss difficult.   Assessment/Plan:   1. Vitamin D insufficiency We will refill prescription Vitamin D for 1 month. Wayne Jordan will follow-up for routine testing of Vitamin D, at least 2-3 times per year to avoid over-replacement.  - Vitamin D, Ergocalciferol, (DRISDOL) 1.25 MG (50000 UNIT) CAPS capsule; Take 1 capsule (50,000 Units total) by mouth every 7 (seven) days.  Dispense: 4 capsule; Refill: 0  2. Prediabetes Wayne Jordan will continue with his diet, exercise, and weight loss and he will continue working on decreasing simple carbohydrates and increasing lean protein.   3. Obesity, Current BMI 39.4 Wayne Jordan is currently in the action stage of change. As such, his goal is to continue with weight loss efforts. He has agreed to keeping a food journal and adhering to recommended goals of 1850 calories and 150 grams of protein daily.   Exercise goals: As is.   Behavioral modification strategies: increasing lean protein  intake.  Wayne Jordan has agreed to follow-up with our clinic in 4 weeks. He was informed of the importance of frequent follow-up visits to maximize his success with intensive lifestyle modifications for his multiple health conditions.   Objective:   Blood pressure (!) 141/79, pulse 71, temperature (!) 97.4 F (36.3 C), height 5\' 11"  (1.803 m), weight 282 lb (127.9 kg), SpO2 94 %. Body mass index is 39.33 kg/m.  General: Cooperative, alert, well developed, in no acute distress. HEENT: Conjunctivae and lids unremarkable. Cardiovascular: Regular rhythm.  Lungs: Normal work of breathing. Neurologic: No focal deficits.   Lab Results  Component Value Date   CREATININE 1.13 07/13/2021   BUN 10 07/13/2021   NA 140 07/13/2021   K 4.8 07/13/2021   CL 104 07/13/2021   CO2 23 07/13/2021   Lab Results  Component Value Date   ALT 34 07/13/2021   AST 28 07/13/2021   ALKPHOS 105 07/13/2021   BILITOT 0.6 07/13/2021   Lab Results  Component Value Date   HGBA1C 5.7 (H) 07/13/2021   HGBA1C 5.8 (H) 07/09/2020   HGBA1C 5.6 10/07/2012   Lab Results  Component Value Date   INSULIN 22.2 05/21/2021   Lab Results  Component Value Date   TSH 2.600 07/13/2021   Lab Results  Component Value Date   CHOL 100 07/13/2021   HDL 42 07/13/2021   LDLCALC 30 07/13/2021   TRIG 167 (H) 07/13/2021   CHOLHDL 2.4 07/13/2021   Lab Results  Component Value Date   VD25OH  24.4 (L) 05/21/2021   Lab Results  Component Value Date   WBC 6.2 07/13/2021   HGB 15.6 07/13/2021   HCT 45.8 07/13/2021   MCV 90 07/13/2021   PLT 199 07/13/2021   No results found for: "IRON", "TIBC", "FERRITIN"  Attestation Statements:   Reviewed by clinician on day of visit: allergies, medications, problem list, medical history, surgical history, family history, social history, and previous encounter notes.   I, Burt Knack, am acting as transcriptionist for Quillian Quince, MD.  I have reviewed the above documentation for  accuracy and completeness, and I agree with the above. -  Quillian Quince, MD

## 2022-01-06 ENCOUNTER — Encounter (INDEPENDENT_AMBULATORY_CARE_PROVIDER_SITE_OTHER): Payer: Self-pay

## 2022-01-06 ENCOUNTER — Ambulatory Visit (INDEPENDENT_AMBULATORY_CARE_PROVIDER_SITE_OTHER): Payer: 59 | Admitting: Physician Assistant

## 2022-01-20 ENCOUNTER — Encounter (INDEPENDENT_AMBULATORY_CARE_PROVIDER_SITE_OTHER): Payer: Self-pay | Admitting: Family Medicine

## 2022-01-20 ENCOUNTER — Ambulatory Visit (INDEPENDENT_AMBULATORY_CARE_PROVIDER_SITE_OTHER): Payer: Commercial Managed Care - PPO | Admitting: Family Medicine

## 2022-01-20 VITALS — BP 149/86 | HR 79 | Temp 97.5°F | Ht 71.0 in | Wt 287.0 lb

## 2022-01-20 DIAGNOSIS — R7303 Prediabetes: Secondary | ICD-10-CM | POA: Diagnosis not present

## 2022-01-20 DIAGNOSIS — E669 Obesity, unspecified: Secondary | ICD-10-CM | POA: Diagnosis not present

## 2022-01-20 DIAGNOSIS — I10 Essential (primary) hypertension: Secondary | ICD-10-CM

## 2022-01-20 DIAGNOSIS — Z6841 Body Mass Index (BMI) 40.0 and over, adult: Secondary | ICD-10-CM | POA: Diagnosis not present

## 2022-01-20 DIAGNOSIS — E559 Vitamin D deficiency, unspecified: Secondary | ICD-10-CM

## 2022-01-20 DIAGNOSIS — Z6839 Body mass index (BMI) 39.0-39.9, adult: Secondary | ICD-10-CM

## 2022-02-03 ENCOUNTER — Telehealth: Payer: Self-pay

## 2022-02-03 ENCOUNTER — Telehealth: Payer: Self-pay | Admitting: Cardiology

## 2022-02-03 ENCOUNTER — Other Ambulatory Visit (HOSPITAL_COMMUNITY): Payer: Self-pay

## 2022-02-03 ENCOUNTER — Other Ambulatory Visit: Payer: Self-pay

## 2022-02-03 MED ORDER — CLOPIDOGREL BISULFATE 75 MG PO TABS
75.0000 mg | ORAL_TABLET | Freq: Every day | ORAL | 3 refills | Status: DC
  Filled 2022-02-03: qty 90, 90d supply, fill #0
  Filled 2022-05-03 (×2): qty 90, 90d supply, fill #1
  Filled 2022-08-13: qty 90, 90d supply, fill #2
  Filled 2022-10-29: qty 90, 90d supply, fill #3

## 2022-02-03 MED ORDER — ATORVASTATIN CALCIUM 80 MG PO TABS
80.0000 mg | ORAL_TABLET | Freq: Every day | ORAL | 3 refills | Status: DC
  Filled 2022-02-03: qty 90, 90d supply, fill #0
  Filled 2022-05-03 (×2): qty 90, 90d supply, fill #1
  Filled 2022-08-13: qty 90, 90d supply, fill #2
  Filled 2022-10-29: qty 90, 90d supply, fill #3

## 2022-02-03 MED ORDER — METOPROLOL TARTRATE 100 MG PO TABS
ORAL_TABLET | ORAL | 3 refills | Status: DC
  Filled 2022-02-03: qty 180, fill #0

## 2022-02-03 NOTE — Telephone Encounter (Signed)
Metoprolol 100 #90 ref x 3, Atorvastatin 80mg  #90 ref x 3, Plavix 75mg  #90 ref x 3 Sent to [facility]

## 2022-02-03 NOTE — Telephone Encounter (Signed)
*  STAT* If patient is at the pharmacy, call can be transferred to refill team.   1. Which medications need to be refilled? (please list name of each medication and dose if known)   atorvastatin (LIPITOR) 80 MG tablet    clopidogrel (PLAVIX) 75 MG tablet    metoprolol tartrate (LOPRESSOR) 100 MG tablet    2. Which pharmacy/location (including street and city if local pharmacy) is medication to be sent to? [place]   3. Do they need a 30 day or 90 day supply?  90 day

## 2022-02-04 NOTE — Progress Notes (Unsigned)
Chief Complaint:   OBESITY Wayne Jordan is here to discuss his progress with his obesity treatment plan along with follow-up of his obesity related diagnoses. Wayne Jordan is on the Category 4 Plan and states he is following his eating plan approximately 15% of the time. Wayne Jordan states he is not exercising.  Today's visit was #: 10 Starting weight: 282 LBS Starting date: 05/21/2021 Today's weight: 287 LBS Today's date: 01/20/2022 Total lbs lost to date: 0 Total lbs lost since last in-office visit: +5 LBS  Interim History: Patient struggled with staying on plan.  Has not been journaling intake and has not been eating his proteins.  Positive for caloric beverages, simple carbs each day.  Patient is very low in lean proteins daily.  Subjective:   1. Vitamin D deficiency Patient has not taken regularly just forgets, even though he has a pillbox.  2. Essential hypertension Blood pressures are between 120-125/70s at home.  He checks once weekly.  Patient is asymptomatic with no concerns.  Patient is taking Lopressor, Norvasc.  Patient is not at goal today.  3. Prediabetes Patient is still eating rice, regular bread, drinking juices daily.  No symptoms or concerns.  Assessment/Plan:  No orders of the defined types were placed in this encounter.   There are no discontinued medications.   No orders of the defined types were placed in this encounter.    1. Vitamin D deficiency Medication compliance importance discussed with patient.  Medication counseling done and how vitamin D is important for weight loss journey.  2. Essential hypertension Check blood pressure at home 3 to 4 days/week.  Continue medications as prescribed.  Decrease salt intake and increase exercise.  3. Prediabetes Patient denies medications such as metformin or others.  Continue PNP, increase exercise, decrease simple carbs i.e. bread, rice, pasta etc.  4. Obesity, Current BMI 40.0 Patient's goal is to restart resistance  training 2 days/week and will resume swimming 2 days/week.  Long discussion with patient about following meal plan which was reviewed with her again today.  Wayne Jordan is currently in the action stage of change. As such, his goal is to continue with weight loss efforts. He has agreed to the Category 4 Plan with breakfast and lunch options.  Exercise goals: For substantial health benefits, adults should do at least 150 minutes (2 hours and 30 minutes) a week of moderate-intensity, or 75 minutes (1 hour and 15 minutes) a week of vigorous-intensity aerobic physical activity, or an equivalent combination of moderate- and vigorous-intensity aerobic activity. Aerobic activity should be performed in episodes of at least 10 minutes, and preferably, it should be spread throughout the week.  Behavioral modification strategies: increasing lean protein intake, decreasing simple carbohydrates, meal planning and cooking strategies, and planning for success.  Wayne Jordan has agreed to follow-up with our clinic in 3 weeks. He was informed of the importance of frequent follow-up visits to maximize his success with intensive lifestyle modifications for his multiple health conditions.   Objective:   Blood pressure (!) 149/86, pulse 79, temperature (!) 97.5 F (36.4 C), height 5\' 11"  (1.803 m), weight 287 lb (130.2 kg), SpO2 94 %. Body mass index is 40.03 kg/m.  General: Cooperative, alert, well developed, in no acute distress. HEENT: Conjunctivae and lids unremarkable. Cardiovascular: Regular rhythm.  Lungs: Normal work of breathing. Neurologic: No focal deficits.   Lab Results  Component Value Date   CREATININE 1.13 07/13/2021   BUN 10 07/13/2021   NA 140 07/13/2021   K  4.8 07/13/2021   CL 104 07/13/2021   CO2 23 07/13/2021   Lab Results  Component Value Date   ALT 34 07/13/2021   AST 28 07/13/2021   ALKPHOS 105 07/13/2021   BILITOT 0.6 07/13/2021   Lab Results  Component Value Date   HGBA1C 5.7 (H)  07/13/2021   HGBA1C 5.8 (H) 07/09/2020   HGBA1C 5.6 10/07/2012   Lab Results  Component Value Date   INSULIN 22.2 05/21/2021   Lab Results  Component Value Date   TSH 2.600 07/13/2021   Lab Results  Component Value Date   CHOL 100 07/13/2021   HDL 42 07/13/2021   LDLCALC 30 07/13/2021   TRIG 167 (H) 07/13/2021   CHOLHDL 2.4 07/13/2021   Lab Results  Component Value Date   VD25OH 24.4 (L) 05/21/2021   Lab Results  Component Value Date   WBC 6.2 07/13/2021   HGB 15.6 07/13/2021   HCT 45.8 07/13/2021   MCV 90 07/13/2021   PLT 199 07/13/2021   No results found for: "IRON", "TIBC", "FERRITIN"  Attestation Statements:   Reviewed by clinician on day of visit: allergies, medications, problem list, medical history, surgical history, family history, social history, and previous encounter notes.  I, Davy Pique, RMA, am acting as Location manager for [employer], DO.   I have reviewed the above documentation for accuracy and completeness, and I agree with the above. Marjory Sneddon, D.O.  The [place] was signed into law in 2016 which includes the topic of electronic health records.  This provides immediate access to information in MyChart.  This includes consultation notes, operative notes, office notes, lab results and pathology reports.  If you have any questions about what you read please let us know at your next visit so we can discuss your concerns and take corrective action if need be.  We are right here with you.

## 2022-02-08 ENCOUNTER — Other Ambulatory Visit (HOSPITAL_COMMUNITY): Payer: Self-pay

## 2022-02-08 ENCOUNTER — Other Ambulatory Visit (INDEPENDENT_AMBULATORY_CARE_PROVIDER_SITE_OTHER): Payer: Self-pay | Admitting: Family Medicine

## 2022-02-08 ENCOUNTER — Other Ambulatory Visit: Payer: Self-pay | Admitting: Cardiology

## 2022-02-08 DIAGNOSIS — E559 Vitamin D deficiency, unspecified: Secondary | ICD-10-CM

## 2022-02-08 MED ORDER — AMLODIPINE BESYLATE 5 MG PO TABS
5.0000 mg | ORAL_TABLET | Freq: Every day | ORAL | 1 refills | Status: DC
  Filled 2022-02-08: qty 90, 90d supply, fill #0
  Filled 2022-05-03 (×2): qty 90, 90d supply, fill #1

## 2022-02-09 ENCOUNTER — Encounter (INDEPENDENT_AMBULATORY_CARE_PROVIDER_SITE_OTHER): Payer: Self-pay | Admitting: Family Medicine

## 2022-02-09 ENCOUNTER — Other Ambulatory Visit (HOSPITAL_COMMUNITY): Payer: Self-pay

## 2022-02-09 ENCOUNTER — Ambulatory Visit (INDEPENDENT_AMBULATORY_CARE_PROVIDER_SITE_OTHER): Payer: Commercial Managed Care - PPO | Admitting: Family Medicine

## 2022-02-09 VITALS — BP 135/85 | HR 77 | Temp 97.8°F | Ht 71.0 in | Wt 273.6 lb

## 2022-02-09 DIAGNOSIS — Z6839 Body mass index (BMI) 39.0-39.9, adult: Secondary | ICD-10-CM

## 2022-02-09 DIAGNOSIS — E669 Obesity, unspecified: Secondary | ICD-10-CM

## 2022-02-09 DIAGNOSIS — E7800 Pure hypercholesterolemia, unspecified: Secondary | ICD-10-CM | POA: Diagnosis not present

## 2022-02-09 DIAGNOSIS — I10 Essential (primary) hypertension: Secondary | ICD-10-CM | POA: Diagnosis not present

## 2022-02-09 DIAGNOSIS — Z6838 Body mass index (BMI) 38.0-38.9, adult: Secondary | ICD-10-CM

## 2022-02-09 DIAGNOSIS — R7303 Prediabetes: Secondary | ICD-10-CM | POA: Diagnosis not present

## 2022-02-09 DIAGNOSIS — E559 Vitamin D deficiency, unspecified: Secondary | ICD-10-CM

## 2022-02-09 DIAGNOSIS — E7849 Other hyperlipidemia: Secondary | ICD-10-CM

## 2022-02-09 MED ORDER — VITAMIN D (ERGOCALCIFEROL) 1.25 MG (50000 UNIT) PO CAPS
50000.0000 [IU] | ORAL_CAPSULE | ORAL | 0 refills | Status: DC
  Filled 2022-02-09: qty 4, 28d supply, fill #0

## 2022-02-10 LAB — COMPREHENSIVE METABOLIC PANEL
ALT: 36 IU/L (ref 0–44)
AST: 26 IU/L (ref 0–40)
Albumin/Globulin Ratio: 1.8 (ref 1.2–2.2)
Albumin: 4.6 g/dL (ref 3.9–4.9)
Alkaline Phosphatase: 112 IU/L (ref 44–121)
BUN/Creatinine Ratio: 12 (ref 10–24)
BUN: 13 mg/dL (ref 8–27)
Bilirubin Total: 0.8 mg/dL (ref 0.0–1.2)
CO2: 22 mmol/L (ref 20–29)
Calcium: 10.1 mg/dL (ref 8.6–10.2)
Chloride: 104 mmol/L (ref 96–106)
Creatinine, Ser: 1.05 mg/dL (ref 0.76–1.27)
Globulin, Total: 2.6 g/dL (ref 1.5–4.5)
Glucose: 107 mg/dL — ABNORMAL HIGH (ref 70–99)
Potassium: 4.7 mmol/L (ref 3.5–5.2)
Sodium: 143 mmol/L (ref 134–144)
Total Protein: 7.2 g/dL (ref 6.0–8.5)
eGFR: 80 mL/min/{1.73_m2} (ref >59)

## 2022-02-10 LAB — LIPID PANEL
Chol/HDL Ratio: 2.1 ratio (ref 0.0–5.0)
Cholesterol, Total: 88 mg/dL — ABNORMAL LOW (ref 100–199)
HDL: 41 mg/dL (ref >39)
LDL Chol Calc (NIH): 28 mg/dL (ref 0–99)
Triglycerides: 101 mg/dL (ref 0–149)
VLDL Cholesterol Cal: 19 mg/dL (ref 5–40)

## 2022-02-10 LAB — VITAMIN D 25 HYDROXY (VIT D DEFICIENCY, FRACTURES): Vit D, 25-Hydroxy: 76.4 ng/mL (ref 30.0–100.0)

## 2022-02-10 LAB — INSULIN, RANDOM: INSULIN: 20.5 u[IU]/mL (ref 2.6–24.9)

## 2022-02-10 LAB — HEMOGLOBIN A1C
Est. average glucose Bld gHb Est-mCnc: 114 mg/dL
Hgb A1c MFr Bld: 5.6 % (ref 4.8–5.6)

## 2022-02-13 ENCOUNTER — Encounter: Payer: Self-pay | Admitting: Family

## 2022-02-16 DIAGNOSIS — G4733 Obstructive sleep apnea (adult) (pediatric): Secondary | ICD-10-CM | POA: Diagnosis not present

## 2022-02-16 NOTE — Telephone Encounter (Signed)
Schedule appointment?

## 2022-02-19 ENCOUNTER — Encounter: Payer: Self-pay | Admitting: Family

## 2022-02-25 NOTE — Progress Notes (Signed)
Chief Complaint:   OBESITY Wayne Jordan is here to discuss his progress with his obesity treatment plan along with follow-up of his obesity related diagnoses. Rohun is on the Category 4 Plan with breakfast and lunch options and states he is following his eating plan approximately 0% of the time. Josealberto states he is at the gym 45-60 minutes 3 times per week.  Today's visit was #: 11 Starting weight: 282 lbs Starting date: 05/21/2021 Today's weight: 273 LBS Today's date: 02/09/2022 Total lbs lost to date: 9 lbs Total lbs lost since last in-office visit: 14 lbs  Interim History: Patient is doing a church fast, only eats when the sun goes down. Eating chicken and rice, spaghetti and regular bread.  He is doing a 21-day fast.  Here to get labs.  Lost 8 pounds in fat mass.  Subjective:   1. Prediabetes Patient has good control of hunger and cravings.  Patient is not taking any medication.  Patient denies emotional eating.  2. Essential hypertension Patient has not checked blood pressure as instructed due to blood pressure 149/86.  Patient is taking Norvasc.  3. Other hyperlipidemia Patient sees cardiology.  Patient is taking Lipitor.  4. Vitamin D deficiency Wayne Jordan is tolerating medication(s) well without side effects.  Medication compliance is good as patient endorses taking it as prescribed.  Symptoms are stable and the patient denies additional concerns regarding this condition.  Patient is taking ergocalciferol.  Assessment/Plan:   Orders Placed This Encounter  Procedures   VITAMIN D 25 Hydroxy (Vit-D Deficiency, Fractures)   Lipid panel   Insulin, random   Hemoglobin A1c   Comprehensive metabolic panel    Medications Discontinued During This Encounter  Medication Reason   Vitamin D, Ergocalciferol, (DRISDOL) 1.25 MG (50000 UNIT) CAPS capsule Reorder     Meds ordered this encounter  Medications   Vitamin D, Ergocalciferol, (DRISDOL) 1.25 MG (50000 UNIT) CAPS  capsule    Sig: Take 1 capsule (50,000 Units total) by mouth every 7 (seven) days.    Dispense:  4 capsule    Refill:  0     1. Prediabetes Check labs today.  - Insulin, random - Hemoglobin A1c  2. Essential hypertension Check blood pressure at home 2-3 times per week. Check labs today.  - Comprehensive metabolic panel  3. Other hyperlipidemia Continue Lipitor per cardiology.  Check labs today.  - Lipid panel - Comprehensive metabolic panel  4. Vitamin D deficiency Check labs today.  Refill- Vitamin D, Ergocalciferol, (DRISDOL) 1.25 MG (50000 UNIT) CAPS capsule; Take 1 capsule (50,000 Units total) by mouth every 7 (seven) days.  Dispense: 4 capsule; Refill: 0  - VITAMIN D 25 Hydroxy (Vit-D Deficiency, Fractures)  5. Obesity, Current BMI 38.2 Increase protein intake and continue exercise but add 2 days of lifting weights to his 3 days of cardio.  [name] is currently in the action stage of change. As such, his goal is to continue with weight loss efforts. He has agreed to the Category 4 Plan with breakfast and lunch options.  Exercise goals:  2 days of lifting weights to his 3 days of cardio.  Behavioral modification strategies: increasing lean protein intake.  [name] has agreed to follow-up with our clinic in 3-4 weeks. He was informed of the importance of frequent follow-up visits to maximize his success with intensive lifestyle modifications for his multiple health conditions.    Objective:   Blood pressure 135/85, pulse 77, temperature 97.8 F (36.6 C), height  $5' 11"t$  (1.803 m), SpO2 94 %. Body mass index is 40.03 kg/m.  General: Cooperative, alert, well developed, in no acute distress. HEENT: Conjunctivae and lids unremarkable. Cardiovascular: Regular rhythm.  Lungs: Normal work of breathing. Neurologic: No focal deficits.   Lab Results  Component Value Date   CREATININE 1.05 02/09/2022   BUN 13 02/09/2022   NA 143 02/09/2022   K 4.7 02/09/2022   CL 104  02/09/2022   CO2 22 02/09/2022   Lab Results  Component Value Date   ALT 36 02/09/2022   AST 26 02/09/2022   ALKPHOS 112 02/09/2022   BILITOT 0.8 02/09/2022   Lab Results  Component Value Date   HGBA1C 5.6 02/09/2022   HGBA1C 5.7 (H) 07/13/2021   HGBA1C 5.8 (H) 07/09/2020   HGBA1C 5.6 10/07/2012   Lab Results  Component Value Date   INSULIN 20.5 02/09/2022   INSULIN 22.2 05/21/2021   Lab Results  Component Value Date   TSH 2.600 07/13/2021   Lab Results  Component Value Date   CHOL 88 (L) 02/09/2022   HDL 41 02/09/2022   LDLCALC 28 02/09/2022   TRIG 101 02/09/2022   CHOLHDL 2.1 02/09/2022   Lab Results  Component Value Date   VD25OH 76.4 02/09/2022   VD25OH 24.4 (L) 05/21/2021   Lab Results  Component Value Date   WBC 6.2 07/13/2021   HGB 15.6 07/13/2021   HCT 45.8 07/13/2021   MCV 90 07/13/2021   PLT 199 07/13/2021   No results found for: "IRON", "TIBC", "FERRITIN"  Attestation Statements:   Reviewed by clinician on day of visit: allergies, medications, problem list, medical history, surgical history, family history, social history, and previous encounter notes.  I, Davy Pique, RMA, am acting as Location manager for [employer], DO.  I have reviewed the above documentation for accuracy and completeness, and I agree with the above. Marjory Sneddon, D.O.  The [place] was signed into law in 2016 which includes the topic of electronic health records.  This provides immediate access to information in MyChart.  This includes consultation notes, operative notes, office notes, lab results and pathology reports.  If you have any questions about what you read please let us know at your next visit so we can discuss your concerns and take corrective action if need be.  We are right here with you.

## 2022-03-05 DIAGNOSIS — Z0184 Encounter for antibody response examination: Secondary | ICD-10-CM | POA: Diagnosis not present

## 2022-03-05 DIAGNOSIS — Z23 Encounter for immunization: Secondary | ICD-10-CM | POA: Diagnosis not present

## 2022-03-10 ENCOUNTER — Ambulatory Visit (INDEPENDENT_AMBULATORY_CARE_PROVIDER_SITE_OTHER): Payer: Commercial Managed Care - PPO | Admitting: Family Medicine

## 2022-03-10 ENCOUNTER — Encounter (INDEPENDENT_AMBULATORY_CARE_PROVIDER_SITE_OTHER): Payer: Self-pay | Admitting: Family Medicine

## 2022-03-10 VITALS — BP 141/83 | HR 81 | Temp 97.7°F | Ht 71.0 in | Wt 271.4 lb

## 2022-03-10 DIAGNOSIS — I10 Essential (primary) hypertension: Secondary | ICD-10-CM | POA: Diagnosis not present

## 2022-03-10 DIAGNOSIS — E7849 Other hyperlipidemia: Secondary | ICD-10-CM | POA: Diagnosis not present

## 2022-03-10 DIAGNOSIS — R7303 Prediabetes: Secondary | ICD-10-CM

## 2022-03-10 DIAGNOSIS — Z6837 Body mass index (BMI) 37.0-37.9, adult: Secondary | ICD-10-CM

## 2022-03-10 DIAGNOSIS — E559 Vitamin D deficiency, unspecified: Secondary | ICD-10-CM | POA: Diagnosis not present

## 2022-03-10 MED ORDER — VITAMIN D (ERGOCALCIFEROL) 1.25 MG (50000 UNIT) PO CAPS: 50000.0000 [IU] | ORAL_CAPSULE | ORAL | 0 refills | Status: DC

## 2022-03-11 ENCOUNTER — Telehealth: Payer: Self-pay

## 2022-03-11 ENCOUNTER — Other Ambulatory Visit: Payer: Self-pay | Admitting: Family

## 2022-03-11 ENCOUNTER — Ambulatory Visit (INDEPENDENT_AMBULATORY_CARE_PROVIDER_SITE_OTHER): Payer: Commercial Managed Care - PPO

## 2022-03-11 DIAGNOSIS — Z23 Encounter for immunization: Secondary | ICD-10-CM | POA: Diagnosis not present

## 2022-03-11 DIAGNOSIS — Z2989 Encounter for other specified prophylactic measures: Secondary | ICD-10-CM

## 2022-03-11 DIAGNOSIS — Z6837 Body mass index (BMI) 37.0-37.9, adult: Secondary | ICD-10-CM | POA: Insufficient documentation

## 2022-03-11 MED ORDER — DOXYCYCLINE HYCLATE 100 MG PO TABS: ORAL_TABLET | ORAL | 0 refills | Status: DC

## 2022-03-11 NOTE — Telephone Encounter (Signed)
Pt contacted advised medication has been sent into pharmacy

## 2022-03-11 NOTE — Telephone Encounter (Signed)
Pt presents for HEP a vaccination taking trip to [place] -health dept has advised pt that he needs to get Rx for Malaria tablets from PCP

## 2022-03-11 NOTE — Telephone Encounter (Signed)
Doxycycline order complete.

## 2022-03-18 DIAGNOSIS — G4733 Obstructive sleep apnea (adult) (pediatric): Secondary | ICD-10-CM | POA: Diagnosis not present

## 2022-03-23 NOTE — Progress Notes (Unsigned)
Chief Complaint:   OBESITY Wayne Jordan is here to discuss his progress with his obesity treatment plan along with follow-up of his obesity related diagnoses. Wayne Jordan is on the Category 4 Plan and states he is following his eating plan approximately 30-40% of the time. Wayne Jordan states he is strength 45-60 minutes 3 times per week.  Today's visit was #: 12 Starting weight: 282 lbs Starting date: 05/21/2021 Today's weight: 271 lbs Today's date: 03/10/2022 Total lbs lost to date: 11 lbs Total lbs lost since last in-office visit: +2 lbs  Interim History: Patient is not sure he is eating and enough protein.  Patient not weighing foods and would like to get more serious with his weight loss.  He does not feel he is losing enough and endorses he can do better.  He eats pasta and other foods is 64 year old and 64 year old likes.  Subjective:   1. Prediabetes Discussed labs with patient today. Patient has no concerns with meal plan.  Patient is asymptomatic.  A1c improved to 5.6 now prior was 5.8.  Fasting insulin at 20.5, prior 22.2.  2. Vitamin D deficiency Discussed labs with patient today. Vitamin D 76.4 improved from 24.4, his energy is better.  Patient is on 2000 IU OTC daily and once weekly ergocalciferol.   3. Other hyperlipidemia Discussed labs with patient today. FLP, improved triglycerides.  Total cholesterol 88, LDL 28, HDL 41, triglycerides 101.  Patient is taking Lipitor.  4. Essential hypertension Discussed labs with patient today. CMP within normal limits.  Patient is taking Norvasc.  Assessment/Plan:   1. Prediabetes Patient declines need for medication at this time at he is not having much hunger or cravings.  He will decrease simple carbs and hit protein goals.  Continue exercise.  2. Vitamin D deficiency Start OTC vitamin D.  Discontinue per medication list.  Recheck vitamin D level in 3 months or so.  Disease counseling done on importance of vitamin D in items of his  weight loss.  Continue with prescription vitamin D.  Refill- Vitamin D, Ergocalciferol, (DRISDOL) 1.25 MG (50000 UNIT) CAPS capsule; Take 1 capsule (50,000 Units total) by mouth every 7 (seven) days.  Dispense: 4 capsule; Refill: 0  3. Other hyperlipidemia Continue medications but encouraged to ask Dr.Krasowski if he can decrease the dose or not.  Counseling done on disease and how foods affect his levels.  Continue PNP and exercise.  Avoid trans and saturated fats.  4. Essential hypertension Blood pressure high normal today.  On Norvasc and continue to follow-up with [facility] medication management.  Continue weight loss via PNP.  5. BMI 37.0-37.9, adult-current bmi 37.9  6. Morbid obesity (HCC)-start bmi 39.33 Patient advised to journal intake and bring in log to next office visit.  Long discussion with patient on meal planning and avoiding simple carbs, and off plan foods except for 300 cal or so.  [name] is currently in the action stage of change. As such, his goal is to continue with weight loss efforts. He has agreed to keeping a food journal and adhering to recommended goals of 1800-1900 calories and 120++ protein daily.    Exercise goals:  As is.   Behavioral modification strategies: increasing lean protein intake, decreasing simple carbohydrates, and keeping a strict food journal.  [name] has agreed to follow-up with our clinic in 3-4 weeks. He was informed of the importance of frequent follow-up visits to maximize his success with intensive lifestyle modifications for his multiple health conditions.   Objective:  Blood pressure (!) 141/83, pulse 81, temperature 97.7 F (36.5 C), height '5\' 11"'$  (1.803 m), weight 271 lb 6.4 oz (123.1 kg), SpO2 94 %. Body mass index is 37.85 kg/m.  General: Cooperative, alert, well developed, in no acute distress. HEENT: Conjunctivae and lids unremarkable. Cardiovascular: Regular rhythm.  Lungs: Normal work of breathing. Neurologic: No focal  deficits.   Lab Results  Component Value Date   CREATININE 1.05 02/09/2022   BUN 13 02/09/2022   NA 143 02/09/2022   K 4.7 02/09/2022   CL 104 02/09/2022   CO2 22 02/09/2022   Lab Results  Component Value Date   ALT 36 02/09/2022   AST 26 02/09/2022   ALKPHOS 112 02/09/2022   BILITOT 0.8 02/09/2022   Lab Results  Component Value Date   HGBA1C 5.6 02/09/2022   HGBA1C 5.7 (H) 07/13/2021   HGBA1C 5.8 (H) 07/09/2020   HGBA1C 5.6 10/07/2012   Lab Results  Component Value Date   INSULIN 20.5 02/09/2022   INSULIN 22.2 05/21/2021   Lab Results  Component Value Date   TSH 2.600 07/13/2021   Lab Results  Component Value Date   CHOL 88 (L) 02/09/2022   HDL 41 02/09/2022   LDLCALC 28 02/09/2022   TRIG 101 02/09/2022   CHOLHDL 2.1 02/09/2022   Lab Results  Component Value Date   VD25OH 76.4 02/09/2022   VD25OH 24.4 (L) 05/21/2021   Lab Results  Component Value Date   WBC 6.2 07/13/2021   HGB 15.6 07/13/2021   HCT 45.8 07/13/2021   MCV 90 07/13/2021   PLT 199 07/13/2021   No results found for: "IRON", "TIBC", "FERRITIN"  Attestation Statements:   Reviewed by clinician on day of visit: allergies, medications, problem list, medical history, surgical history, family history, social history, and previous encounter notes.  I, Davy Pique, RMA, am acting as Location manager for [employer], DO.  I have reviewed the above documentation for accuracy and completeness, and I agree with the above. -  ***

## 2022-04-07 ENCOUNTER — Ambulatory Visit (INDEPENDENT_AMBULATORY_CARE_PROVIDER_SITE_OTHER): Payer: Commercial Managed Care - PPO | Admitting: Family Medicine

## 2022-04-07 ENCOUNTER — Encounter (INDEPENDENT_AMBULATORY_CARE_PROVIDER_SITE_OTHER): Payer: Self-pay | Admitting: Family Medicine

## 2022-04-07 ENCOUNTER — Other Ambulatory Visit (HOSPITAL_COMMUNITY): Payer: Self-pay

## 2022-04-07 VITALS — BP 132/90 | HR 81 | Ht 71.0 in | Wt 272.2 lb

## 2022-04-07 DIAGNOSIS — I10 Essential (primary) hypertension: Secondary | ICD-10-CM

## 2022-04-07 DIAGNOSIS — Z6837 Body mass index (BMI) 37.0-37.9, adult: Secondary | ICD-10-CM | POA: Diagnosis not present

## 2022-04-07 DIAGNOSIS — E559 Vitamin D deficiency, unspecified: Secondary | ICD-10-CM | POA: Diagnosis not present

## 2022-04-07 MED ORDER — VITAMIN D (ERGOCALCIFEROL) 1.25 MG (50000 UNIT) PO CAPS
50000.0000 [IU] | ORAL_CAPSULE | ORAL | 0 refills | Status: DC
  Filled 2022-04-07: qty 4, 28d supply, fill #0

## 2022-04-07 NOTE — Progress Notes (Signed)
Wayne Jordan, D.O.  ABFM, ABOM Specializing in Clinical Bariatric Medicine  Office located at: [address], [place]  [address]     Assessment and Plan:   No orders of the defined types were placed in this encounter.   Medications Discontinued During This Encounter  Medication Reason   Vitamin D, Ergocalciferol, (DRISDOL) 1.25 MG (50000 UNIT) CAPS capsule Reorder     Meds ordered this encounter  Medications   Vitamin D, Ergocalciferol, (DRISDOL) 1.25 MG (50000 UNIT) CAPS capsule    Sig: Take 1 capsule (50,000 Units total) by mouth every 7 (seven) days.    Dispense:  4 capsule    Refill:  0    BMI 37.0-37.9, adult-current bmi 38.0 Morbid obesity (HCC)-start bmi 39.33/date 05/21/21 Assessment: Condition is Improving, but not optimized.. Biometric data collected today, was reviewed with patient.  Fat mass has decreased by 2 lbs . Muscle mass has increased by 2.6 lbs. Total body water has increased by 1.6 lbs.   Plan: Continue with keeping a food journal and adhering to recommended goals of 1800-1900 calories and 120 protein, with Category 4 meal plan as guide.    Vitamin D deficiency Assessment: Condition is Controlled.. Labs were reviewed.  Lab Results  Component Value Date   VD25OH 76.4 02/09/2022   VD25OH 24.4 (L) 05/21/2021  On 03/10/22 , we told him to stop OTC 2K IU and use the Ergocalciferol weekly.. No issues with Ergocalciferol 50K IU weekly, compliance good.   Plan: Continue with Ergocalciferol 50K IU weekly. Will refill Vitamin D today.   - weight loss will likely improve availability of vitamin D, thus encouraged Quran to continue with meal plan and their weight loss efforts to further improve this condition.  Thus, we will need to monitor levels regularly (every 3-4 mo on average) to keep levels within normal limits and prevent over supplementation.   Essential hypertension Assessment: Condition is Improving, but not optimized.. Labs were  reviewed.  Last 3 blood pressure readings in our office are as follows: BP Readings from Last 3 Encounters:  04/07/22 (!) 132/90  03/10/22 (!) 141/83  02/09/22 135/85  He does not check blood pressure at home. He is compliant with Norvasc 5 mg daily, denies any headaches. He reports visual changes, but it is not due to blood pressure.   Plan: Continue with Norvasc 5 mg daily as recommended by PCP/specialist.  - We will continue to monitor closely alongside PCP/ specialists.  Pt reminded to also f/up with those individuals as instructed by them.  - We will continue to monitor symptoms as they relate to the his weight loss journey.   TREATMENT PLAN FOR OBESITY:  Recommended Dietary Goals Zbigniew is currently in the action stage of change. As such, his goal is to continue weight management plan. He has agreed to continue keeping a food journal and adhering to recommended goals of 1800-1900 calories and 120 protein with Category 4 plan as guide.  Behavioral Intervention Additional resources provided today: patient declined Evidence-based interventions for health behavior change were utilized today including the discussion of self monitoring techniques, problem-solving barriers and SMART goal setting techniques.   Regarding patient's less desirable eating habits and patterns, we employed the technique of small changes.  Pt will specifically work on: Geneticist, molecular and being more mindful when eating for next visit.    Recommended Physical Activity Goals Lavalle has been advised to work up to 150 minutes of moderate intensity aerobic activity a week  and strengthening exercises 2-3 times per week for cardiovascular health, weight loss maintenance and preservation of muscle mass.  He has agreed to Continue current level of physical activity    FOLLOW UP: Return in about 4 weeks (around 05/05/2022). He was informed of the importance of frequent follow up visits to maximize his success with intensive  lifestyle modifications for his multiple health conditions.  Subjective:   Chief complaint: Obesity [name] is here to discuss his progress with his obesity treatment plan. He is on the keeping a food journal and adhering to recommended goals of 1800-1900 calories and 120 protein and states he is following his eating plan approximately 30% of the time. He states he is exercising 45 minutes 3 days per week.  Interval History:  [name] is here for a follow up office visit. We reviewed his meal plan and all questions were answered. Patient's food recall appears to be accurate and consistent with what is on plan when he is following it. When eating on plan, his hunger and cravings are well controlled.     Since last office visit he has not been food journaling and consuming enough protein. He skips breakfast most of the time.Marland Kitchen He eats tuna and light cheese sticks as snacks.   Review of Systems:  Pertinent positives were addressed with patient today.  Weight Summary and Biometrics   Weight Lost Since Last Visit: 0  Weight Gained Since Last Visit: 1lb    Vitals BP: (!) 132/90 Pulse Rate: 81 SpO2: 96 %   Anthropometric Measurements Height: 5\' 11"  (1.803 m) Weight: 272 lb 3.2 oz (123.5 kg) BMI (Calculated): 37.98 Weight at Last Visit: 271lb Weight Lost Since Last Visit: 0 Weight Gained Since Last Visit: 1lb Starting Weight: 282lb Total Weight Loss (lbs): 10 lb (4.536 kg) Peak Weight: 290lb   Body Composition  Body Fat %: 34.7 % Fat Mass (lbs): 94.4 lbs Muscle Mass (lbs): 169 lbs Total Body Water (lbs): 119.6 lbs   Other Clinical Data Fasting: yes Labs: no Today's Visit #: 13 Starting Date: 05/21/21    Objective:   PHYSICAL EXAM:  Blood pressure (!) 132/90, pulse 81, height 5\' 11"  (1.803 m), weight 272 lb 3.2 oz (123.5 kg), SpO2 96 %. Body mass index is 37.96 kg/m.  General: Well Developed, well nourished, and in no acute distress.  HEENT: Normocephalic,  atraumatic Skin: Warm and dry, cap RF less 2 sec, good turgor Chest:  Normal excursion, shape, no gross abn Respiratory: speaking in full sentences, no conversational dyspnea NeuroM-Sk: Ambulates w/o assistance, moves * 4 Psych: A and O *3, insight good, mood-full  DIAGNOSTIC DATA REVIEWED:  BMET    Component Value Date/Time   NA 143 02/09/2022 1049   K 4.7 02/09/2022 1049   CL 104 02/09/2022 1049   CO2 22 02/09/2022 1049   GLUCOSE 107 (H) 02/09/2022 1049   GLUCOSE 107 (H) 10/17/2020 1200   BUN 13 02/09/2022 1049   CREATININE 1.05 02/09/2022 1049   CALCIUM 10.1 02/09/2022 1049   GFRNONAA >60 10/17/2020 1200   GFRAA >60 01/08/2019 1145   Lab Results  Component Value Date   HGBA1C 5.6 02/09/2022   HGBA1C 5.6 10/07/2012   Lab Results  Component Value Date   INSULIN 20.5 02/09/2022   INSULIN 22.2 05/21/2021   Lab Results  Component Value Date   TSH 2.600 07/13/2021   CBC    Component Value Date/Time   WBC 6.2 07/13/2021 0938   WBC 6.5 10/17/2020 1200  RBC 5.10 07/13/2021 0938   RBC 5.06 10/17/2020 1200   HGB 15.6 07/13/2021 0938   HCT 45.8 07/13/2021 0938   PLT 199 07/13/2021 0938   MCV 90 07/13/2021 0938   MCH 30.6 07/13/2021 0938   MCH 30.2 10/17/2020 1200   MCHC 34.1 07/13/2021 0938   MCHC 32.7 10/17/2020 1200   RDW 12.8 07/13/2021 0938   Iron Studies No results found for: "IRON", "TIBC", "FERRITIN", "IRONPCTSAT" Lipid Panel     Component Value Date/Time   CHOL 88 (L) 02/09/2022 1049   CHOL CANCELED 11/07/2014 0921   CHOL SEE BELOW 11/07/2014 0921   CHOL 128 04/26/2014 0757   TRIG 101 02/09/2022 1049   TRIG CANCELED 11/07/2014 0921   TRIG SEE BELOW 11/07/2014 0921   TRIG 140 04/26/2014 0757   HDL 41 02/09/2022 1049   HDL CANCELED 11/07/2014 0921   HDL SEE BELOW 11/07/2014 0921   HDL 39 (L) 04/26/2014 0757   CHOLHDL 2.1 02/09/2022 1049   CHOLHDL 2.6 04/18/2015 0930   VLDL 22 04/18/2015 0930   LDLCALC 28 02/09/2022 1049   LDLCALC CANCELED  11/07/2014 0921   LDLCALC SEE BELOW 11/07/2014 0921   LDLCALC 61 04/26/2014 0757   Hepatic Function Panel     Component Value Date/Time   PROT 7.2 02/09/2022 1049   ALBUMIN 4.6 02/09/2022 1049   AST 26 02/09/2022 1049   ALT 36 02/09/2022 1049   ALKPHOS 112 02/09/2022 1049   BILITOT 0.8 02/09/2022 1049   BILIDIR 0.19 02/21/2017 0934   IBILI 0.5 11/07/2014 0921      Component Value Date/Time   TSH 2.600 07/13/2021 [id number]   Nutritional Lab Results  Component Value Date   VD25OH 76.4 02/09/2022   VD25OH 24.4 (L) 05/21/2021    Attestations:   Reviewed by clinician on day of visit: allergies, medications, problem list, medical history, surgical history, family history, social history, and previous encounter notes.     I,Special Puri,acting as a Education administrator for [employer], DO.,have documented all relevant documentation on the behalf of Mellody Dance, DO,as directed by  Mellody Dance, DO while in the presence of Mellody Dance, DO.   I, Mellody Dance, DO, have reviewed all documentation for this visit. The documentation on 04/07/22 for the exam, diagnosis, procedures, and orders are all accurate and complete.

## 2022-04-10 ENCOUNTER — Other Ambulatory Visit: Payer: Self-pay | Admitting: Family

## 2022-04-10 DIAGNOSIS — Z2989 Encounter for other specified prophylactic measures: Secondary | ICD-10-CM

## 2022-04-12 ENCOUNTER — Encounter: Payer: Self-pay | Admitting: Family

## 2022-04-17 DIAGNOSIS — G4733 Obstructive sleep apnea (adult) (pediatric): Secondary | ICD-10-CM | POA: Diagnosis not present

## 2022-04-22 ENCOUNTER — Ambulatory Visit (INDEPENDENT_AMBULATORY_CARE_PROVIDER_SITE_OTHER): Payer: Commercial Managed Care - PPO

## 2022-04-22 DIAGNOSIS — Z23 Encounter for immunization: Secondary | ICD-10-CM | POA: Diagnosis not present

## 2022-05-03 ENCOUNTER — Ambulatory Visit (INDEPENDENT_AMBULATORY_CARE_PROVIDER_SITE_OTHER): Payer: Commercial Managed Care - PPO | Admitting: Family Medicine

## 2022-05-03 ENCOUNTER — Other Ambulatory Visit (INDEPENDENT_AMBULATORY_CARE_PROVIDER_SITE_OTHER): Payer: Self-pay | Admitting: Family Medicine

## 2022-05-03 ENCOUNTER — Other Ambulatory Visit (HOSPITAL_COMMUNITY): Payer: Self-pay

## 2022-05-03 ENCOUNTER — Encounter (INDEPENDENT_AMBULATORY_CARE_PROVIDER_SITE_OTHER): Payer: Self-pay | Admitting: Family Medicine

## 2022-05-03 VITALS — BP 115/82 | HR 88 | Temp 97.7°F | Ht 71.0 in | Wt 274.0 lb

## 2022-05-03 DIAGNOSIS — Z6838 Body mass index (BMI) 38.0-38.9, adult: Secondary | ICD-10-CM

## 2022-05-03 DIAGNOSIS — E559 Vitamin D deficiency, unspecified: Secondary | ICD-10-CM

## 2022-05-03 DIAGNOSIS — Z6837 Body mass index (BMI) 37.0-37.9, adult: Secondary | ICD-10-CM

## 2022-05-03 DIAGNOSIS — I10 Essential (primary) hypertension: Secondary | ICD-10-CM | POA: Diagnosis not present

## 2022-05-03 DIAGNOSIS — R7303 Prediabetes: Secondary | ICD-10-CM | POA: Diagnosis not present

## 2022-05-03 MED ORDER — VITAMIN D (ERGOCALCIFEROL) 1.25 MG (50000 UNIT) PO CAPS
50000.0000 [IU] | ORAL_CAPSULE | ORAL | 0 refills | Status: DC
  Filled 2022-05-03: qty 4, 28d supply, fill #0

## 2022-05-03 NOTE — Progress Notes (Signed)
Wayne Jordan, D.O.  ABFM, ABOM Specializing in Clinical Bariatric Medicine  Office located at: [address] [place], [place]  [address]     Assessment and Plan:   No orders of the defined types were placed in this encounter.   Medications Discontinued During This Encounter  Medication Reason   Vitamin D, Ergocalciferol, (DRISDOL) 1.25 MG (50000 UNIT) CAPS capsule Reorder     Meds ordered this encounter  Medications   Vitamin D, Ergocalciferol, (DRISDOL) 1.25 MG (50000 UNIT) CAPS capsule    Sig: Take 1 capsule (50,000 Units total) by mouth every 7 (seven) days.    Dispense:  4 capsule    Refill:  0   Essential hypertension Assessment: Condition is stable. Last 3 blood pressure readings in our office are as follows: BP Readings from Last 3 Encounters:  05/03/22 115/82  04/07/22 (!) 132/90  03/10/22 (!) 141/83  He reports good compliance and tolerance with Norvasc 5 mg daily. No concerns. Asymptomatic.  Plan: Continue with med as prescribed by specialist.  Continue with Prudent nutritional plan and low sodium diet, advance exercise as tolerated.     Prediabetes Assessment: Condition is improving, but not optimized. Lab Results  Component Value Date   HGBA1C 5.6 02/09/2022   HGBA1C 5.7 (H) 07/13/2021   HGBA1C 5.8 (H) 07/09/2020   INSULIN 20.5 02/09/2022   INSULIN 22.2 05/21/2021  Patient is not taking any medication for this condition. This is diet controlled. Patient endorses that his hunger and cravings are controlled when eating on plan.    Plan: - Continue to decrease simple carbs/ sugars; increase fiber and proteins -> follow his meal plan.   [name] will continue to work on weight loss, exercise, via their meal plan we devised to help decrease the risk of progressing to diabetes.    Vitamin D deficiency Assessment: Condition is stable. Lab Results  Component Value Date   VD25OH 76.4 02/09/2022   VD25OH 24.4 (L) 05/21/2021  No issues with  Ergocalciferol 50K IU weekly, compliance good.   Plan: Continue with med. Will refill this today.   Continue his prudent nutritional plan that is low in simple carbohydrates, saturated fats and trans fats to goal of 5-10% weight loss to achieve significant health benefits.  Pt encouraged to continually advance exercise and cardiovascular fitness as tolerated throughout weight loss journey.   TREATMENT PLAN FOR OBESITY: BMI 37.0-37.9, adult-current bmi 38.0 Morbid obesity (HCC)-start bmi 39.33/date 05/21/21 Assessment: Condition is improving, but not optimized. Biometric data collected today, was reviewed with patient.  Fat mass has decreased by .2lb. Muscle mass has increased by 2lb. Total body water has increased by 1.2lb.   Plan:  [name] is currently in the action stage of change. As such, his goal is to continue weight management plan. [name] will work on healthier eating habits and try their best to continue the  keeping a food journal and adhering to recommended goals of 1800-1900 calories and 120+ protein and using the Category 4 meal plan as a guide.  Counseling was done on: I went over the Lose-it app with the patient to show him how to accurately document his food.  I emphasized the importance of increasing lean protein and leafy green intake.   Behavioral Intervention Additional resources provided today: category 4 meal plan information, Food journaling plan information, and lunch options Evidence-based interventions for health behavior change were utilized today including the discussion of self monitoring techniques, problem-solving barriers and SMART goal setting techniques.  Regarding patient's less desirable eating habits and patterns, we employed the technique of small changes.  Pt will specifically work on: journal caloric and protein intake daily and bring journaling log for next visit.    Recommended Physical Activity Goals Deluca has been advised to work up to 150 minutes of  moderate intensity aerobic activity a week and strengthening exercises 2-3 times per week for cardiovascular health, weight loss maintenance and preservation of muscle mass.  He has agreed to Continue current level of physical activity   FOLLOW UP: Return in about 4 weeks (around 05/31/2022). He was informed of the importance of frequent follow up visits to maximize his success with intensive lifestyle modifications for his multiple health conditions.   Subjective:   Chief complaint: Obesity Wayne Jordan is here to discuss his progress with his obesity treatment plan. He is on the keeping a food journal and adhering to recommended goals of 1800-1900 calories and 120+ protein , with Category 4 meal plan as a guide and states he is following his eating plan approximately 30-40% of the time. He states he is exercising 30-45 minutes 3 days per week.  Interval History:  Wayne Jordan is here for a follow up office visit. Since last office visit patient endorses he has been eating at cookouts and restaurants on the weekends. He has not been journaling his food intake.We reviewed his meal plan and all questions were answered.   Review of Systems:  Pertinent positives were addressed with patient today.  Weight Summary and Biometrics   Weight Lost Since Last Visit: 0  Weight Gained Since Last Visit: 2    Vitals Temp: 97.7 F (36.5 C) BP: 115/82 Pulse Rate: 88 SpO2: 95 %   Anthropometric Measurements Height: 5\' 11"  (1.803 m) Weight: 274 lb (124.3 kg) BMI (Calculated): 38.23 Weight at Last Visit: 272 lb Weight Lost Since Last Visit: 0 Weight Gained Since Last Visit: 2 Starting Weight: 282 lb Total Weight Loss (lbs): 8 lb (3.629 kg) Peak Weight: 290 lb   Body Composition  Body Fat %: 34.4 % Fat Mass (lbs): 94.2 lbs Muscle Mass (lbs): 171 lbs Total Body Water (lbs): 120.8 lbs Visceral Fat Rating : 22   Other Clinical Data Fasting: no Labs: no Today's Visit #: 14 Starting Date:  05/21/21   Objective:   PHYSICAL EXAM: Blood pressure 115/82, pulse 88, temperature 97.7 F (36.5 C), height 5\' 11"  (1.803 m), weight 274 lb (124.3 kg), SpO2 95 %. Body mass index is 38.22 kg/m.  General: Well Developed, well nourished, and in no acute distress.  HEENT: Normocephalic, atraumatic Skin: Warm and dry, cap RF less 2 sec, good turgor Chest:  Normal excursion, shape, no gross abn Respiratory: speaking in full sentences, no conversational dyspnea NeuroM-Sk: Ambulates w/o assistance, moves * 4 Psych: A and O *3, insight good, mood-full  DIAGNOSTIC DATA REVIEWED:  BMET    Component Value Date/Time   NA 143 02/09/2022 1049   K 4.7 02/09/2022 1049   CL 104 02/09/2022 1049   CO2 22 02/09/2022 1049   GLUCOSE 107 (H) 02/09/2022 1049   GLUCOSE 107 (H) 10/17/2020 1200   BUN 13 02/09/2022 1049   CREATININE 1.05 02/09/2022 1049   CALCIUM 10.1 02/09/2022 1049   GFRNONAA >60 10/17/2020 1200   GFRAA >60 01/08/2019 1145   Lab Results  Component Value Date   HGBA1C 5.6 02/09/2022   HGBA1C 5.6 10/07/2012   Lab Results  Component Value Date   INSULIN 20.5 02/09/2022   INSULIN  22.2 05/21/2021   Lab Results  Component Value Date   TSH 2.600 07/13/2021   CBC    Component Value Date/Time   WBC 6.2 07/13/2021 0938   WBC 6.5 10/17/2020 1200   RBC 5.10 07/13/2021 0938   RBC 5.06 10/17/2020 1200   HGB 15.6 07/13/2021 0938   HCT 45.8 07/13/2021 0938   PLT 199 07/13/2021 0938   MCV 90 07/13/2021 0938   MCH 30.6 07/13/2021 0938   MCH 30.2 10/17/2020 1200   MCHC 34.1 07/13/2021 0938   MCHC 32.7 10/17/2020 1200   RDW 12.8 07/13/2021 0938   Iron Studies No results found for: "IRON", "TIBC", "FERRITIN", "IRONPCTSAT" Lipid Panel     Component Value Date/Time   CHOL 88 (L) 02/09/2022 1049   CHOL CANCELED 11/07/2014 0921   CHOL SEE BELOW 11/07/2014 0921   CHOL 128 04/26/2014 0757   TRIG 101 02/09/2022 1049   TRIG CANCELED 11/07/2014 0921   TRIG SEE BELOW 11/07/2014  0921   TRIG 140 04/26/2014 0757   HDL 41 02/09/2022 1049   HDL CANCELED 11/07/2014 0921   HDL SEE BELOW 11/07/2014 0921   HDL 39 (L) 04/26/2014 0757   CHOLHDL 2.1 02/09/2022 1049   CHOLHDL 2.6 04/18/2015 0930   VLDL 22 04/18/2015 0930   LDLCALC 28 02/09/2022 1049   LDLCALC CANCELED 11/07/2014 0921   LDLCALC SEE BELOW 11/07/2014 0921   LDLCALC 61 04/26/2014 0757   Hepatic Function Panel     Component Value Date/Time   PROT 7.2 02/09/2022 1049   ALBUMIN 4.6 02/09/2022 1049   AST 26 02/09/2022 1049   ALT 36 02/09/2022 1049   ALKPHOS 112 02/09/2022 1049   BILITOT 0.8 02/09/2022 1049   BILIDIR 0.19 02/21/2017 0934   IBILI 0.5 11/07/2014 0921      Component Value Date/Time   TSH 2.600 07/13/2021 1610   Nutritional Lab Results  Component Value Date   VD25OH 76.4 02/09/2022   VD25OH 24.4 (L) 05/21/2021    Attestations:   Reviewed by clinician on day of visit: allergies, medications, problem list, medical history, surgical history, family history, social history, and previous encounter notes.  I,Special Puri,acting as a Neurosurgeon for [employer], DO.,have documented all relevant documentation on the behalf of Thomasene Lot, DO,as directed by  Thomasene Lot, DO while in the presence of Thomasene Lot, DO.   I, Thomasene Lot, DO, have reviewed all documentation for this visit. The documentation on 05/03/22 for the exam, diagnosis, procedures, and orders are all accurate and complete.

## 2022-05-04 ENCOUNTER — Other Ambulatory Visit (HOSPITAL_COMMUNITY): Payer: Self-pay

## 2022-05-07 ENCOUNTER — Telehealth: Payer: Self-pay | Admitting: Family

## 2022-05-07 NOTE — Telephone Encounter (Signed)
Pt is calling in with the names of medications to help prevent diarrhea while flying. Pt says he has been working with Wyvonna Plum and he was instructed to call back when he had the names of the medications. Pt says the medication is Cipro. Pt says he has an appointment coming up and wanted to know could the medication be sent to the pharmacy the same day of his appointment.

## 2022-05-13 ENCOUNTER — Encounter: Payer: Self-pay | Admitting: Cardiology

## 2022-05-13 ENCOUNTER — Ambulatory Visit: Payer: Commercial Managed Care - PPO | Attending: Cardiology | Admitting: Cardiology

## 2022-05-13 VITALS — BP 124/70 | HR 72 | Ht 71.0 in | Wt 279.0 lb

## 2022-05-13 DIAGNOSIS — E782 Mixed hyperlipidemia: Secondary | ICD-10-CM

## 2022-05-13 DIAGNOSIS — G4733 Obstructive sleep apnea (adult) (pediatric): Secondary | ICD-10-CM

## 2022-05-13 DIAGNOSIS — I25110 Atherosclerotic heart disease of native coronary artery with unstable angina pectoris: Secondary | ICD-10-CM | POA: Diagnosis not present

## 2022-05-13 NOTE — Progress Notes (Signed)
Cardiology Office Note:    Date:  05/13/2022   ID:  Wayne Jordan, DOB 05/19/1960, MRN [id number]  PCP:  Wayne Fendt, NP  Cardiologist:  Wayne Balsam, MD    Referring MD: Wayne Fendt, NP   Chief Complaint  Patient presents with   Follow-up  Doing well  History of Present Illness:    Wayne Jordan is a 62 y.o. male past medical history significant for coronary artery disease, status post drug-eluting stent in September 2022 LAD infections non-STEMI, essential hypertension, dyslipidemia, obstructive sleep apnea.  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  He goes to gym about 3 times a week he exercise doing mostly weightlifting but also some aerobic exercise.  Also recently he was diagnosed with prior diabetes.  Past Medical History:  Diagnosis Date   Acute myocardial infarction of other anterior wall, initial episode of care    Alcohol abuse    CAD- LAD DES in setting of NSTEMI Sept 2014 04/06/2013   Chest pain    Coronary artery disease    s/P PCI of the LAD with DES.  Nuclear stress test 04/2014 with no ischemia   Displaced fracture of proximal phalanx of left little finger with routine healing 08/01/2018   Drug use    Encounter for long-term (current) use of other medications 04/06/2013   History of left hip replacement    Hypercholesteremia    Hyperlipidemia    Joint pain    Mixed hyperlipidemia 04/06/2013   Morbid obesity due to excess calories 05/26/2015   Nondisplaced fracture of proximal phalanx of left great toe, initial encounter for closed fracture 08/01/2018   Obesity, unspecified 10/08/2012   Old MI (myocardial infarction)  04/06/2013   OSA on CPAP    followed by Dr. Jessie Jordan   Personal history of urinary calculi    Pre-diabetes    Primary osteoarthritis of left hip 09/20/2018   Primary osteoarthritis of right hip 11/21/2018   Shortness of breath    Sleep apnea    Status post total hip replacement, left 04/10/2019   Status post total replacement of left hip 01/15/2019   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    Umbilical hernia without obstruction and without gangrene 08/25/2020    Past Surgical History:  Procedure Laterality Date   INGUINAL HERNIA REPAIR Right 03/08/2012   Procedure: HERNIA REPAIR INGUINAL ADULT;  Surgeon: Axel Filler, MD;  Location: [facility] OR;  Service: General;  Laterality: Right;   INSERTION OF MESH N/A 03/08/2012   Procedure: INSERTION OF MESH;  Surgeon: Axel Filler, MD;  Location: [facility] OR;  Service: General;  Laterality: N/A;   INSERTION OF MESH N/A 10/22/2020   Procedure: INSERTION OF MESH;  Surgeon: Fritzi Mandes, MD;  Location: [facility] OR;  Service: General;  Laterality: N/A;   LEFT HEART CATHETERIZATION WITH CORONARY ANGIOGRAM Right 10/07/2012   Procedure: LEFT HEART CATHETERIZATION WITH CORONARY ANGIOGRAM;  Surgeon: Corky Crafts, MD;  Location: [facility] CATH LAB;  Service: Cardiovascular;  Laterality: Right;   METACARPAL OSTEOTOMY Left 11/01/2018   Procedure: Left small finger osteophyte removal;  Surgeon: Tarry Kos, MD;  Location: [facility];  Service: Orthopedics;  Laterality: Left;   PERCUTANEOUS STENT INTERVENTION  10/07/2012   Procedure: PERCUTANEOUS STENT INTERVENTION;  Surgeon: Corky Crafts, MD;  Location: [facility] CATH LAB;  Service: Cardiovascular;;   TOTAL HIP ARTHROPLASTY Left 01/15/2019   Procedure: LEFT TOTAL HIP ARTHROPLASTY ANTERIOR APPROACH;  Surgeon: Tarry Kos, MD;  Location: [facility] OR;  Service: Orthopedics;  Laterality: Left;   UMBILICAL HERNIA REPAIR N/A 10/22/2020   Procedure: OPEN UMBILICAL HERNIA REPAIR WITH MESH;  Surgeon: Fritzi Mandes, MD;   Location: [facility] OR;  Service: General;  Laterality: N/A;    Current Medications: Current Meds  Medication Sig   amLODipine (NORVASC) 5 MG tablet Take 1 tablet (5 mg total) by mouth daily.   aspirin EC 81 MG tablet Take 1 tablet (81 mg total) by mouth daily. Swallow whole.   atorvastatin (LIPITOR) 80 MG tablet Take 1 tablet (80 mg total) by mouth daily.   clopidogrel (PLAVIX) 75 MG tablet Take 1 tablet (75 mg total) by mouth daily.   cyclobenzaprine (FLEXERIL) 5 MG tablet Take 1 tablet (5 mg total) by mouth 3 (three) times daily as needed for muscle spasms.   doxycycline (VIBRA-TABS) 100 MG tablet Take 1 tablet (100 mg total) by mouth daily. Begin 2 days before trip. Continue for the duration of trip and continue for 4 weeks after trip. (Patient taking differently: Take 100 mg by mouth See admin instructions. Take 1 tablet (100 mg total) by mouth daily. Begin 2 days before trip. Continue for the duration of trip and continue for 4 weeks after trip.)   Multiple Vitamin (MULTI-VITAMIN DAILY PO) Take 1 tablet by mouth daily.   nitroGLYCERIN (NITROSTAT) 0.4 MG SL tablet Place 1 tablet (0.4 mg total) under the tongue every 5 (five) minutes x 3 doses as needed for chest pain.   Vitamin D, Ergocalciferol, (DRISDOL) 1.25 MG (50000 UNIT) CAPS capsule Take 1 capsule (50,000 Units total) by mouth every 7 (seven) days.   zinc gluconate 50 MG tablet Take 50 mg by mouth daily.   [DISCONTINUED] ascorbic acid (VITAMIN C) 500 MG tablet Take 1,000 mg by mouth daily.     Allergies:   Coconut fatty acids and Penicillins   Social History   Socioeconomic History   Marital status: Married    Spouse name: Wayne Jordan   Number of children: Not on file   Years of education: Not on file   Highest education level: Not on file  Occupational History   Occupation: Retired  Tobacco Use   Smoking status: Former    Packs/day: 1.50    Years: 7.00    Additional pack years: 0.00    Total pack years: 10.50    Types: Cigarettes     Quit date: 10/01/2012    Years since quitting: 9.6    Passive exposure: Past   Smokeless tobacco: Never  Vaping Use   Vaping Use: Never used  Substance and Sexual Activity   Alcohol use: Not Currently    Comment: occassionally   Drug use: No   Sexual activity: Yes  Other Topics Concern   Not on file  Social History Narrative   Drinks 2 cheerwines daily.   Social Determinants of Health   Financial Resource Strain: Not on file  Food Insecurity: Not on file  Transportation Needs: Not on file  Physical Activity: Not on file  Stress: Not on file  Social Connections: Not on file     Family History: The patient's family history includes CAD in his father; Heart attack in  his father; Heart disease in his father. ROS:   Please see the history of present illness.    All 14 point review of systems negative except as described per history of present illness  EKGs/Labs/Other Studies Reviewed:      Recent Labs: 07/13/2021: Hemoglobin 15.6; Platelets 199; TSH 2.600 02/09/2022: ALT 36; BUN 13; Creatinine, Ser 1.05; Potassium 4.7; Sodium 143  Recent Lipid Panel    Component Value Date/Time   CHOL 88 (L) 02/09/2022 1049   CHOL CANCELED 11/07/2014 0921   CHOL SEE BELOW 11/07/2014 0921   CHOL 128 04/26/2014 0757   TRIG 101 02/09/2022 1049   TRIG CANCELED 11/07/2014 0921   TRIG SEE BELOW 11/07/2014 0921   TRIG 140 04/26/2014 0757   HDL 41 02/09/2022 1049   HDL CANCELED 11/07/2014 0921   HDL SEE BELOW 11/07/2014 0921   HDL 39 (L) 04/26/2014 0757   CHOLHDL 2.1 02/09/2022 1049   CHOLHDL 2.6 04/18/2015 0930   VLDL 22 04/18/2015 0930   LDLCALC 28 02/09/2022 1049   LDLCALC CANCELED 11/07/2014 0921   LDLCALC SEE BELOW 11/07/2014 0921   LDLCALC 61 04/26/2014 0757    Physical Exam:    VS:  BP 124/70 (BP Location: Left Arm, Patient Position: Sitting)   Pulse 72   Ht 5\' 11"  (1.803 m)   Wt 279 lb (126.6 kg)   SpO2 94%   BMI 38.91 kg/m     Wt Readings from Last 3 Encounters:   05/13/22 279 lb (126.6 kg)  05/03/22 274 lb (124.3 kg)  04/07/22 272 lb 3.2 oz (123.5 kg)     GEN:  Well nourished, well developed in no acute distress HEENT: Normal NECK: No JVD; No carotid bruits LYMPHATICS: No lymphadenopathy CARDIAC: RRR, no murmurs, no rubs, no gallops RESPIRATORY:  Clear to auscultation without rales, wheezing or rhonchi  ABDOMEN: Soft, non-tender, non-distended MUSCULOSKELETAL:  No edema; No deformity  SKIN: Warm and dry LOWER EXTREMITIES: no swelling NEUROLOGIC:  Alert and oriented x 3 PSYCHIATRIC:  Normal affect   ASSESSMENT:    1. Coronary artery disease involving native coronary artery of native heart with unstable angina pectoris   2. OSA on CPAP   3. Mixed hyperlipidemia    PLAN:    In order of problems listed above:  Coronary disease stable from that point to be on dual antiplatelet therapy which I will continue. Dyslipidemia I did review K PN which show me his LDL of 28 HDL 41 this is from January 2024 we will continue high intense statin for of Lipitor 80. Obstructive sleep apnea on CPAP mask. Prediabetes we did talk in length about the need to stop drinking sodas as well as continuation of exercises on the regular basis with emphasis on aerobic exercise   Medication Adjustments/Labs and Tests Ordered: Current medicines are reviewed at length with the patient today.  Concerns regarding medicines are outlined above.  No orders of the defined types were placed in this encounter.  Medication changes: No orders of the defined types were placed in this encounter.   Signed, Georgeanna Lea, MD, [facility] 05/13/2022 3:07 PM    [facility]

## 2022-05-13 NOTE — Patient Instructions (Signed)

## 2022-05-17 ENCOUNTER — Ambulatory Visit: Payer: Commercial Managed Care - PPO | Admitting: Neurology

## 2022-05-17 ENCOUNTER — Encounter: Payer: Self-pay | Admitting: Neurology

## 2022-05-17 VITALS — BP 129/89 | HR 75 | Ht 71.0 in | Wt 276.0 lb

## 2022-05-17 DIAGNOSIS — G4733 Obstructive sleep apnea (adult) (pediatric): Secondary | ICD-10-CM

## 2022-05-17 NOTE — Patient Instructions (Signed)
Living With Sleep Apnea Sleep apnea is a condition in which breathing pauses or becomes shallow during sleep. Sleep apnea is most commonly caused by a collapsed or blocked airway. People with sleep apnea usually snore loudly. They may have times when they gasp and stop breathing for 10 seconds or more during sleep. This may happen many times during the night. The breaks in breathing also interrupt the deep sleep that you need to feel rested. Even if you do not completely wake up from the gaps in breathing, your sleep may not be restful and you feel tired during the day. You may also have a headache in the morning and low energy during the day, and you may feel anxious or depressed. How can sleep apnea affect me? Sleep apnea increases your chances of extreme tiredness during the day (daytime fatigue). It can also increase your risk for health conditions, such as: Heart attack. Stroke. Obesity. Type 2 diabetes. Heart failure. Irregular heartbeat. High blood pressure. If you have daytime fatigue as a result of sleep apnea, you may be more likely to: Perform poorly at school or work. Fall asleep while driving. Have difficulty with attention. Develop depression or anxiety. Have sexual dysfunction. What actions can I take to manage sleep apnea? Sleep apnea treatment  If you were given a device to open your airway while you sleep, use it only as told by your health care provider. You may be given: An oral appliance. This is a custom-made mouthpiece that shifts your lower jaw forward. A continuous positive airway pressure (CPAP) device. This device blows air through a mask when you breathe out (exhale). A nasal expiratory positive airway pressure (EPAP) device. This device has valves that you put into each nostril. A bi-level positive airway pressure (BIPAP) device. This device blows air through a mask when you breathe in (inhale) and breathe out (exhale). You may need surgery if other treatments  do not work for you. Sleep habits Go to sleep and wake up at the same time every day. This helps set your internal clock (circadian rhythm) for sleeping. If you stay up later than usual, such as on weekends, try to get up in the morning within 2 hours of your normal wake time. Try to get at least 7-9 hours of sleep each night. Stop using a computer, tablet, and mobile phone a few hours before bedtime. Do not take long naps during the day. If you nap, limit it to 30 minutes. Have a relaxing bedtime routine. Reading or listening to music may relax you and help you sleep. Use your bedroom only for sleep. Keep your television and computer out of your bedroom. Keep your bedroom cool, dark, and quiet. Use a supportive mattress and pillows. Follow your health care provider's instructions for other changes to sleep habits. Nutrition Do not eat heavy meals in the evening. Do not have caffeine in the later part of the day. The effects of caffeine can last for more than 5 hours. Follow your health care provider's or dietitian's instructions for any diet changes. Lifestyle     Do not drink alcohol before bedtime. Alcohol can cause you to fall asleep at first, but then it can cause you to wake up in the middle of the night and have trouble getting back to sleep. Do not use any products that contain nicotine or tobacco. These products include cigarettes, chewing tobacco, and vaping devices, such as e-cigarettes. If you need help quitting, ask your health care provider. Medicines Take   over-the-counter and prescription medicines only as told by your health care provider. Do not use over-the-counter sleep medicine. You can become dependent on this medicine, and it can make sleep apnea worse. Do not use medicines, such as sedatives and narcotics, unless told by your health care provider. Activity Exercise on most days, but avoid exercising in the evening. Exercising near bedtime can interfere with  sleeping. If possible, spend time outside every day. Natural light helps regulate your circadian rhythm. General information Lose weight if you need to, and maintain a healthy weight. Keep all follow-up visits. This is important. If you are having surgery, make sure to tell your health care provider that you have sleep apnea. You may need to bring your device with you. Where to find more information Learn more about sleep apnea and daytime fatigue from: American Sleep Association: sleepassociation.org National Sleep Foundation: sleepfoundation.org National Heart, Lung, and Blood Institute: nhlbi.nih.gov Summary Sleep apnea is a condition in which breathing pauses or becomes shallow during sleep. Sleep apnea can cause daytime fatigue and other serious health conditions. You may need to wear a device while sleeping to help keep your airway open. If you are having surgery, make sure to tell your health care provider that you have sleep apnea. You may need to bring your device with you. Making changes to sleep habits, diet, lifestyle, and activity can help you manage sleep apnea. This information is not intended to replace advice given to you by your health care provider. Make sure you discuss any questions you have with your health care provider. Document Revised: 08/13/2020 Document Reviewed: 12/14/2019 Elsevier Patient Education  2023 Elsevier Inc.  

## 2022-05-17 NOTE — Progress Notes (Signed)
Provider:  Melvyn Novas, MD  Primary Care Physician:  Rema Fendt, NP [address] [place] [address]     Referring Provider: Rema Fendt, Np [address] [place],  [place] [address]          Chief Complaint according to patient   Patient presents with:     New Patient (Initial Visit)          CLINICAL INFORMATION/HISTORY: Wayne Jordan is a 62 y.o. male patient who is here for revisit 05/17/2022 for  follow up on HST and new CPAP-.  Chief concern according to patient :  Wayne Jordan is seen here today for follow-up on his relatively new CPAP machine which she received after home sleep test last year.  He has been 100% compliant patient by hours of daily use and days.  6 hours 43 minutes as the average user time each night.  His AutoSet uses a pressure window between 6 and 16 cmH2O and 3 cm expiratory relief.  The residual AHI is 2.3/h which is excellent.  The 95th percentile pressure is 11.6 cm water and is well within the current window.  Air leak is low at the 91st percentile at 10 L a minute.  And his residual apneas are mostly obstructive.  Based on this we would not have to do any  changes to the settings , mask and tubing.  His previous machine was issued in 2017.  And when I saw him here his AHI was 12/h.  He had max major oxygen desaturations at the time.   His HST sleep study showed still significant severe sleep apnea worse in supine sleep position and his AHI was 74/h versus sleep on the right side when his AHI was 36/h.   08/20/21: Wayne Jordan is a 62 year old male with a history of obstructive sleep apnea on CPAP.Hx of CAD, MI, Morbid Obesity, umbilical hernia. He uses a Veterinary surgeon  and was not informed of recall or replacement . Reports that he never got a new machine after the recall. Straps are too lose he had to have his daughter sew the straps. ( not clear which interface he uses) . The 95% pressure is 13 cm  water. Airleak 16 l/m. AHI 1.3/h.   Calculated pAHI (per hour):    52.9/h                         REM pAHI:    47.2/h                                             NREM pAHI:   53.8/h                          Positional AHI:   The patient slept about 40% of the night in supine position associated with an AHI of 74/h followed by 40% in right lateral sleep with an AHI of 36/h.   Snoring reached a mean volume of 43 dB and was present for 70% of total sleep time.  Age of this machine: 02-2015. Wayne Jordan had a PSG and full night CPAP titration in February 2017, BMI was 39 at that time (!) . AHI at  was 12/h. REM AHI 21/h. He had major oxygen desaturation. All resolved under 13 cm water pressure.      Wayne Jordan is a 62 y.o. male , seen here as a referral  from Dr. Neva Seat  for a sleep evaluation, He has been snoring since childhood, a has been obese for  many years and Dr Neva Seat  reported he suffered a myocardial infarction in 2014 at the age of 83.  He considers himself a " big snorer". He doesn't remember gasping for breath or waking up air hungry. 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 He is married with 5 children, his wife works in a Industrial/product designer. His last heart cath wa sin 10-2014, and " went well "    Sleep habits are as follows: he goes to bed between 11 PM and midnight, and usually falls asleep promptly. His bedroom is cold, quiet and dark. He is restless, moving all the time.  He sleeps so fast , his wife has no opportunity to speak to him after entering the bedroom. Usually he sleeps until 4 -5 AM , has one bathroom break at that time and goes to back to sleep and rises at 6 AM - when his wife wakes him. That's on days he is not on the road. He has to drive throughout in the [facility]. He usually can sleep in a hotel, he always makes sure to get 6-7 hours of rest. He doesn't consider himself sleepy  in daytime. His wife noted some irregular breathing , crescendo snoring. If he naps , he naps for several hours and thus avoids naps. He feels grumpy.    Sleep medical history and family sleep history: No ENT, nasal or neck surgeries. Father was a loud snorer. The patient has 3 brothers , all snore.  Social history: former smoker, quit in 2001 - ETOH: 2 drinks since 1991, Caffeine : 2-3  cheer wine a day.  Review of Systems: Out of a complete 14 system review, the patient complains of only the following symptoms, and all other reviewed systems are negative.:  How likely are you to doze in the following situations: 0 = not likely, 1 = slight chance, 2 = moderate chance, 3 = high chance   Sitting and Reading? Watching Television? Sitting inactive in a public place (theater or meeting)? As a passenger in a car for an hour without a break? Lying down in the afternoon when circumstances permit? Sitting and talking to someone? Sitting quietly after lunch without alcohol? In a car, while stopped for a few minutes in traffic?   Total = 4/ 24 points   FSS endorsed at 19/ 63 points.  GDS , 1/ 15  points.   Social History   Socioeconomic History   Marital status: Married    Spouse name: Abbie   Number of children: Not on file   Years of education: Not on file   Highest education level: Not on file  Occupational History   Occupation: Retired  Tobacco Use   Smoking status: Former    Packs/day: 1.50    Years: 7.00    Additional pack years: 0.00    Total pack years: 10.50    Types: Cigarettes    Quit date: 10/01/2012  Years since quitting: 9.6    Passive exposure: Past   Smokeless tobacco: Never  Vaping Use   Vaping Use: Never used  Substance and Sexual Activity   Alcohol use: Not Currently    Comment: occassionally   Drug use: No   Sexual activity: Yes  Other Topics Concern   Not on file  Social History Narrative   Drinks 2 cheerwines daily.   Social Determinants of Health    Financial Resource Strain: Not on file  Food Insecurity: Not on file  Transportation Needs: Not on file  Physical Activity: Not on file  Stress: Not on file  Social Connections: Not on file    Family History  Problem Relation Age of Onset   Heart disease Father    Heart attack Father    CAD Father     Past Medical History:  Diagnosis Date   Acute myocardial infarction of other anterior wall, initial episode of care    Alcohol abuse    CAD- LAD DES in setting of NSTEMI Sept 2014 04/06/2013   Chest pain    Coronary artery disease    s/P PCI of the LAD with DES.  Nuclear stress test 04/2014 with no ischemia   Displaced fracture of proximal phalanx of left little finger with routine healing 08/01/2018   Drug use    Encounter for long-term (current) use of other medications 04/06/2013   History of left hip replacement    Hypercholesteremia    Hyperlipidemia    Joint pain    Mixed hyperlipidemia 04/06/2013   Morbid obesity due to excess calories (HCC) 05/26/2015   Nondisplaced fracture of proximal phalanx of left great toe, initial encounter for closed fracture 08/01/2018   Obesity, unspecified 10/08/2012   Old MI (myocardial infarction) 04/06/2013   OSA on CPAP    followed by Dr. Jessie Foot   Personal history of urinary calculi    Pre-diabetes    Primary osteoarthritis of left hip 09/20/2018   Primary osteoarthritis of right hip 11/21/2018   Shortness of breath    Sleep apnea    Status post total hip replacement, left 04/10/2019   Status post total replacement of left hip 01/15/2019   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    Umbilical hernia without obstruction and without gangrene 08/25/2020    Past Surgical History:  Procedure Laterality Date   INGUINAL HERNIA REPAIR Right 03/08/2012   Procedure: HERNIA REPAIR INGUINAL ADULT;  Surgeon: Axel Filler, MD;  Location[facility] OR;  Service: General;  Laterality: Right;   INSERTION OF MESH N/A 03/08/2012   Procedure: INSERTION OF  MESH;  Surgeon: Axel Filler, MD;  Location: [facility] OR;  Service: General;  Laterality: N/A;   INSERTION OF MESH N/A 10/22/2020   Procedure: INSERTION OF MESH;  Surgeon: Fritzi Mandes, MD;  Location: [facility] OR;  Service: General;  Laterality: N/A;   LEFT HEART CATHETERIZATION WITH CORONARY ANGIOGRAM Right 10/07/2012   Procedure: LEFT HEART CATHETERIZATION WITH CORONARY ANGIOGRAM;  Surgeon: Corky Crafts, MD;  Location: [facility] CATH LAB;  Service: Cardiovascular;  Laterality: Right;   METACARPAL OSTEOTOMY Left 11/01/2018   Procedure: Left small finger osteophyte removal;  Surgeon: Tarry Kos, MD;  Location: [facility];  Service: Orthopedics;  Laterality: Left;   PERCUTANEOUS STENT INTERVENTION  10/07/2012   Procedure: PERCUTANEOUS STENT INTERVENTION;  Surgeon: Corky Crafts, MD;  Location: [facility] CATH LAB;  Service: Cardiovascular;;   TOTAL HIP ARTHROPLASTY Left 01/15/2019   Procedure: LEFT TOTAL HIP ARTHROPLASTY ANTERIOR  APPROACH;  Surgeon: Tarry Kos, MD;  Location: [facility] OR;  Service: Orthopedics;  Laterality: Left;   UMBILICAL HERNIA REPAIR N/A 10/22/2020   Procedure: OPEN UMBILICAL HERNIA REPAIR WITH MESH;  Surgeon: Fritzi Mandes, MD;  Location: [facility] OR;  Service: General;  Laterality: N/A;     Current Outpatient Medications on File Prior to Visit  Medication Sig Dispense Refill   amLODipine (NORVASC) 5 MG tablet Take 1 tablet (5 mg total) by mouth daily. 90 tablet 1   aspirin EC 81 MG tablet Take 1 tablet (81 mg total) by mouth daily. Swallow whole. 90 tablet 3   atorvastatin (LIPITOR) 80 MG tablet Take 1 tablet (80 mg total) by mouth daily. 90 tablet 3   clopidogrel (PLAVIX) 75 MG tablet Take 1 tablet (75 mg total) by mouth daily. 90 tablet 3   doxycycline (VIBRA-TABS) 100 MG tablet Take 1 tablet (100 mg total) by mouth daily. Begin 2 days before trip. Continue for the duration of trip and continue for 4 weeks after trip. (Patient taking differently: Take 100 mg by mouth See  admin instructions. Take 1 tablet (100 mg total) by mouth daily. Begin 2 days before trip. Continue for the duration of trip and continue for 4 weeks after trip.) 44 tablet 0   Multiple Vitamin (MULTI-VITAMIN DAILY PO) Take 1 tablet by mouth daily.     nitroGLYCERIN (NITROSTAT) 0.4 MG SL tablet Place 1 tablet (0.4 mg total) under the tongue every 5 (five) minutes x 3 doses as needed for chest pain. 25 tablet 9   Vitamin D, Ergocalciferol, (DRISDOL) 1.25 MG (50000 UNIT) CAPS capsule Take 1 capsule (50,000 Units total) by mouth every 7 (seven) days. 4 capsule 0   zinc gluconate 50 MG tablet Take 50 mg by mouth daily.     No current facility-administered medications on file prior to visit.    Allergies  Allergen Reactions   Coconut Fatty Acids Shortness Of Breath and Swelling   Penicillins Hives and Rash    Did it involve swelling of the face/tongue/throat, SOB, or low BP? Yes Did it involve sudden or severe rash/hives, skin peeling, or any reaction on the inside of your mouth or nose? No Did you need to seek medical attention at a hospital or doctor's office? No When did it last happen?      unknown  If all above answers are "NO", may proceed with cephalosporin use.      DIAGNOSTIC DATA (LABS, IMAGING, TESTING) - I reviewed patient records, labs, notes, testing and imaging myself where available.  Lab Results  Component Value Date   WBC 6.2 07/13/2021   HGB 15.6 07/13/2021   HCT 45.8 07/13/2021   MCV 90 07/13/2021   PLT 199 07/13/2021      Component Value Date/Time   NA 143 02/09/2022 1049   K 4.7 02/09/2022 1049   CL 104 02/09/2022 1049   CO2 22 02/09/2022 1049   GLUCOSE 107 (H) 02/09/2022 1049   GLUCOSE 107 (H) 10/17/2020 1200   BUN 13 02/09/2022 1049   CREATININE 1.05 02/09/2022 1049   CALCIUM 10.1 02/09/2022 1049   PROT 7.2 02/09/2022 1049   ALBUMIN 4.6 02/09/2022 1049   AST 26 02/09/2022 1049   ALT 36 02/09/2022 1049   ALKPHOS 112 02/09/2022 1049   BILITOT 0.8  02/09/2022 1049   GFRNONAA >60 10/17/2020 1200   GFRAA >60 01/08/2019 1145   Lab Results  Component Value Date   CHOL 88 (L) 02/09/2022  HDL 41 02/09/2022   LDLCALC 28 02/09/2022   TRIG 101 02/09/2022   CHOLHDL 2.1 02/09/2022   Lab Results  Component Value Date   HGBA1C 5.6 02/09/2022   No results found for: "VITAMINB12" Lab Results  Component Value Date   TSH 2.600 07/13/2021    PHYSICAL EXAM:  Today's Vitals   05/17/22 1557  BP: 129/89  Pulse: 75  Weight: 276 lb (125.2 kg)  Height: 5\' 11"  (1.803 m)   Body mass index is 38.49 kg/m.   Wt Readings from Last 3 Encounters:  05/17/22 276 lb (125.2 kg)  05/13/22 279 lb (126.6 kg)  05/03/22 274 lb (124.3 kg)     Ht Readings from Last 3 Encounters:  05/17/22 5\' 11"  (1.803 m)  05/13/22 5\' 11"  (1.803 m)  05/03/22 5\' 11"  (1.803 m)   here for today for initial cpap visit. Overall doing well. DME [employer].    General: Well developed, in no acute distress    Neck size 19.5"  Mallampati 5- no braces or expanders, crowded dental status/     Neurological examination  Mentation: Alert oriented to time, place, history taking. Follows all commands speech and language fluent Cranial nerve Pupils were equal round reactive to light. Extraocular movements were full, visual field were full on confrontational test. Facial sensation and strength were normal. Uvula invisible -  tongue midline. Head turning and shoulder shrug  were normal and symmetric.  NEUROLOGIC EXAM: The patient is awake and alert, oriented to place and time.   Memory subjective described as intact.  Attention span & concentration ability appears normal.  Speech is fluent,  without  dysarthria, dysphonia or aphasia.  Mood and affect are appropriate.   Cranial nerves: no loss of smell or taste reported  Pupils are equal and briskly reactive to light. Funduscopic exam deferred.  Extraocular movements in vertical and horizontal planes were intact and without  nystagmus. No Diplopia. Visual fields by finger perimetry are intact. Hearing was intact to soft voice and finger rubbing.    Facial sensation intact to fine touch.  Facial motor strength is symmetric and tongue and uvula move midline.  Neck ROM : rotation, tilt and flexion extension were normal for age and shoulder shrug was symmetrical.    Motor exam:  Symmetric bulk, tone and ROM.   Normal tone without cog wheeling, symmetric grip strength . Gait and station: Patient could rise unassisted from a seated position, walked without assistive device.  Toe and heel walk were deferred.  Deep tendon reflexes: in the  upper and lower extremities are symmetric and intact.   ASSESSMENT AND PLAN 62 y.o. year old male  here with past medical history of Acute myocardial infarction of other anterior wall, initial episode of care, Coronary artery disease, Hypercholesteremia, Hyperlipidemia, OSA on CPAP, Personal history of urinary calculi, and 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. here with:  new CPAP first visit.   1)  High compliance and excellent resolution of severe OSA. Keep using this setting.   2)  Main risk factors for OSA are still weight related, neck size . BMI 38.5.    I plan to follow up either personally or through our NP within 12 months.   I would like to thank Rema Fendt, Np [address] [place],  [place] [address] for allowing me to meet with and to take care of this pleasant patient.   CC:  After spending a total time of 30 minutes face to face and  additional time for physical and neurologic examination, review of laboratory studies,  personal review of imaging studies, reports and results of other testing and review of referral information / records as far as provided in visit,   Electronically signed by Melvyn Novas, MD 05/17/2022 4:22 PM  [facility] Board certified by The [employer] of Sleep Medicine and Diplomate of the  [employer] of [employer] Medicine. Board certified In Neurology through the ABPN, Fellow of the [employer] of Neurology.

## 2022-05-18 DIAGNOSIS — G4733 Obstructive sleep apnea (adult) (pediatric): Secondary | ICD-10-CM | POA: Diagnosis not present

## 2022-05-20 ENCOUNTER — Ambulatory Visit (INDEPENDENT_AMBULATORY_CARE_PROVIDER_SITE_OTHER): Payer: Commercial Managed Care - PPO

## 2022-05-20 DIAGNOSIS — Z23 Encounter for immunization: Secondary | ICD-10-CM

## 2022-05-20 NOTE — Telephone Encounter (Signed)
Resending telephone note to CMA per pt request.

## 2022-05-21 NOTE — Progress Notes (Unsigned)
Patient ID: Wayne Jordan, male    DOB: 1960/05/23  MRN: [id number]  CC: Discuss Medications for Travel  Subjective: Wayne Jordan is a 62 y.o. male who presents for discuss medications for travel.   His concerns today include:  Reports he will be traveling to [place] in June for 12 days. Requests medication for diarrhea and nausea should he need it. Reports after receiving vaccines in April he developed diarrhea but bowel movements returning to normal since then. No further issues/concerns for discussion today.   Patient Active Problem List   Diagnosis Date Noted   BMI 37.0-37.9, adult-current bmi 37.9 03/11/2022   Morbid obesity (HCC)-start bmi 39.33 03/11/2022   Vitamin D deficiency 02/09/2022   Essential hypertension 01/20/2022   At risk for diabetes mellitus 10/15/2021   Class 2 severe obesity with serious comorbidity and body mass index (BMI) of 39.0 to 39.9 in adult [facility]) 10/15/2021   Vitamin D insufficiency 05/23/2021   Umbilical hernia without obstruction and without gangrene 08/25/2020   Prediabetes 07/10/2020   Coronary artery disease    Hypercholesteremia    Hyperlipidemia    Personal history of urinary calculi    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    Status post total hip replacement, left 04/10/2019   Status post total replacement of left hip 01/15/2019   Primary osteoarthritis of right hip 11/21/2018   Primary osteoarthritis of left hip 09/20/2018   Nondisplaced fracture of proximal phalanx of left great toe, initial encounter for closed fracture 08/01/2018   Displaced fracture of proximal phalanx of left little finger with routine healing 08/01/2018   OSA on CPAP 05/26/2015   Morbid obesity due to excess calories (HCC) 05/26/2015   CAD- LAD DES in setting of NSTEMI Sept 2014 04/06/2013   Mixed hyperlipidemia 04/06/2013   Old MI (myocardial infarction) 04/06/2013   Encounter for long-term (current) use of other medications 04/06/2013   Obesity, unspecified  10/08/2012     Current Outpatient Medications on File Prior to Visit  Medication Sig Dispense Refill   amLODipine (NORVASC) 5 MG tablet Take 1 tablet (5 mg total) by mouth daily. 90 tablet 1   aspirin EC 81 MG tablet Take 1 tablet (81 mg total) by mouth daily. Swallow whole. 90 tablet 3   atorvastatin (LIPITOR) 80 MG tablet Take 1 tablet (80 mg total) by mouth daily. 90 tablet 3   clopidogrel (PLAVIX) 75 MG tablet Take 1 tablet (75 mg total) by mouth daily. 90 tablet 3   Multiple Vitamin (MULTI-VITAMIN DAILY PO) Take 1 tablet by mouth daily.     nitroGLYCERIN (NITROSTAT) 0.4 MG SL tablet Place 1 tablet (0.4 mg total) under the tongue every 5 (five) minutes x 3 doses as needed for chest pain. 25 tablet 9   Vitamin D, Ergocalciferol, (DRISDOL) 1.25 MG (50000 UNIT) CAPS capsule Take 1 capsule (50,000 Units total) by mouth every 7 (seven) days. 4 capsule 0   zinc gluconate 50 MG tablet Take 50 mg by mouth daily.     doxycycline (VIBRA-TABS) 100 MG tablet Take 1 tablet (100 mg total) by mouth daily. Begin 2 days before trip. Continue for the duration of trip and continue for 4 weeks after trip. (Patient not taking: Reported on 05/25/2022) 44 tablet 0   No current facility-administered medications on file prior to visit.    Allergies  Allergen Reactions   Coconut Fatty Acids Shortness Of Breath and Swelling   Penicillins Hives and Rash    Did it involve swelling  of the face/tongue/throat, SOB, or low BP? Yes Did it involve sudden or severe rash/hives, skin peeling, or any reaction on the inside of your mouth or nose? No Did you need to seek medical attention at a hospital or doctor's office? No When did it last happen?      unknown  If all above answers are "NO", may proceed with cephalosporin use.     Social History   Socioeconomic History   Marital status: Married    Spouse name: Abbie   Number of children: Not on file   Years of education: Not on file   Highest education level: Not on  file  Occupational History   Occupation: Retired  Tobacco Use   Smoking status: Former    Packs/day: 1.50    Years: 7.00    Additional pack years: 0.00    Total pack years: 10.50    Types: Cigarettes    Quit date: 10/01/2012    Years since quitting: 9.6    Passive exposure: Past   Smokeless tobacco: Never  Vaping Use   Vaping Use: Never used  Substance and Sexual Activity   Alcohol use: Not Currently    Comment: occassionally   Drug use: No   Sexual activity: Yes  Other Topics Concern   Not on file  Social History Narrative   Drinks 2 cheerwines daily.   Social Determinants of Health   Financial Resource Strain: Not on file  Food Insecurity: Not on file  Transportation Needs: Not on file  Physical Activity: Not on file  Stress: Not on file  Social Connections: Not on file  Intimate Partner Violence: Not on file    Family History  Problem Relation Age of Onset   Heart disease Father    Heart attack Father    CAD Father     Past Surgical History:  Procedure Laterality Date   INGUINAL HERNIA REPAIR Right 03/08/2012   Procedure: HERNIA REPAIR INGUINAL ADULT;  Surgeon: Axel Filler, MD;  Location[facility];  Service: General;  Laterality: Right;   INSERTION OF MESH N/A 03/08/2012   Procedure: INSERTION OF MESH;  Surgeon: Axel Filler, MD;  Location: [facility] OR;  Service: General;  Laterality: N/A;   INSERTION OF MESH N/A 10/22/2020   Procedure: INSERTION OF MESH;  Surgeon: Fritzi Mandes, MD;  Location: [facility] OR;  Service: General;  Laterality: N/A;   LEFT HEART CATHETERIZATION WITH CORONARY ANGIOGRAM Right 10/07/2012   Procedure: LEFT HEART CATHETERIZATION WITH CORONARY ANGIOGRAM;  Surgeon: Corky Crafts, MD;  Location: [facility] CATH LAB;  Service: Cardiovascular;  Laterality: Right;   METACARPAL OSTEOTOMY Left 11/01/2018   Procedure: Left small finger osteophyte removal;  Surgeon: Tarry Kos, MD;  Location: [facility];  Service: Orthopedics;  Laterality:  Left;   PERCUTANEOUS STENT INTERVENTION  10/07/2012   Procedure: PERCUTANEOUS STENT INTERVENTION;  Surgeon: Corky Crafts, MD;  Location: [facility] CATH LAB;  Service: Cardiovascular;;   TOTAL HIP ARTHROPLASTY Left 01/15/2019   Procedure: LEFT TOTAL HIP ARTHROPLASTY ANTERIOR APPROACH;  Surgeon: Tarry Kos, MD;  Location: [facility] OR;  Service: Orthopedics;  Laterality: Left;   UMBILICAL HERNIA REPAIR N/A 10/22/2020   Procedure: OPEN UMBILICAL HERNIA REPAIR WITH MESH;  Surgeon: Fritzi Mandes, MD;  Location: [facility] OR;  Service: General;  Laterality: N/A;    ROS: Review of Systems Negative except as stated above  PHYSICAL EXAM: BP 125/84 (BP Location: Right Arm, Patient Position: Sitting, Cuff Size: Large)   Pulse 70  Temp 98 F (36.7 C) (Oral)   Resp 16   Wt 276 lb (125.2 kg)   SpO2 96%   BMI 38.49 kg/m   Physical Exam HENT:     Head: Normocephalic and atraumatic.  Eyes:     Extraocular Movements: Extraocular movements intact.     Conjunctiva/sclera: Conjunctivae normal.     Pupils: Pupils are equal, round, and reactive to light.  Cardiovascular:     Rate and Rhythm: Normal rate and regular rhythm.     Pulses: Normal pulses.     Heart sounds: Normal heart sounds.  Pulmonary:     Effort: Pulmonary effort is normal.     Breath sounds: Normal breath sounds.  Musculoskeletal:     Cervical back: Normal range of motion and neck supple.  Neurological:     General: No focal deficit present.     Mental Status: He is alert and oriented to person, place, and time.  Psychiatric:        Mood and Affect: Mood normal.        Behavior: Behavior normal.    ASSESSMENT AND PLAN: Please note - I consulted with Georganna Skeans, MD for treatment plan.   1. Travel advice encounter - Azithromycin and Ondansetron as prescribed. Counseled on medication adherence.  - Follow-up with primary provider as scheduled. - azithromycin (ZITHROMAX) 500 MG tablet; Take 2 tablets (1,000 mg total) by mouth once  for 1 dose.  Dispense: 2 tablet; Refill: 0 - ondansetron (ZOFRAN-ODT) 4 MG disintegrating tablet; Take 1 tablet (4 mg total) by mouth every 8 (eight) hours as needed for nausea or vomiting.  Dispense: 90 tablet; Refill: 0     Patient was given the opportunity to ask questions.  Patient verbalized understanding of the plan and was able to repeat key elements of the plan. Patient was given clear instructions to go to Emergency Department or return to medical center if symptoms don't improve, worsen, or new problems develop.The patient verbalized understanding.   Requested Prescriptions   Signed Prescriptions Disp Refills   azithromycin (ZITHROMAX) 500 MG tablet 2 tablet 0    Sig: Take 2 tablets (1,000 mg total) by mouth once for 1 dose.   ondansetron (ZOFRAN-ODT) 4 MG disintegrating tablet 90 tablet 0    Sig: Take 1 tablet (4 mg total) by mouth every 8 (eight) hours as needed for nausea or vomiting.    Follow-up with primary provider as scheduled.  Rema Fendt, NP

## 2022-05-25 ENCOUNTER — Encounter: Payer: Self-pay | Admitting: Family

## 2022-05-25 ENCOUNTER — Ambulatory Visit: Payer: Commercial Managed Care - PPO | Admitting: Family

## 2022-05-25 VITALS — BP 125/84 | HR 70 | Temp 98.0°F | Resp 16 | Wt 276.0 lb

## 2022-05-25 DIAGNOSIS — Z7184 Encounter for health counseling related to travel: Secondary | ICD-10-CM

## 2022-05-25 MED ORDER — ONDANSETRON 4 MG PO TBDP
4.0000 mg | ORAL_TABLET | Freq: Three times a day (TID) | ORAL | 0 refills | Status: DC | PRN
Start: 2022-05-25 — End: 2023-06-29

## 2022-05-25 MED ORDER — AZITHROMYCIN 500 MG PO TABS: 1000.0000 mg | ORAL_TABLET | Freq: Once | ORAL | 0 refills | Status: AC

## 2022-05-25 NOTE — Progress Notes (Signed)
Had diarrhea after receiving vaccines, Hep B and Tdap in April.  Would like something for antidiarrhea and antiemetic

## 2022-05-31 ENCOUNTER — Encounter (INDEPENDENT_AMBULATORY_CARE_PROVIDER_SITE_OTHER): Payer: Self-pay | Admitting: Family Medicine

## 2022-05-31 ENCOUNTER — Ambulatory Visit (INDEPENDENT_AMBULATORY_CARE_PROVIDER_SITE_OTHER): Payer: Commercial Managed Care - PPO | Admitting: Family Medicine

## 2022-05-31 ENCOUNTER — Other Ambulatory Visit (HOSPITAL_COMMUNITY): Payer: Self-pay

## 2022-05-31 VITALS — BP 138/74 | HR 82 | Temp 97.4°F | Ht 71.0 in | Wt 274.0 lb

## 2022-05-31 DIAGNOSIS — E559 Vitamin D deficiency, unspecified: Secondary | ICD-10-CM

## 2022-05-31 DIAGNOSIS — Z6838 Body mass index (BMI) 38.0-38.9, adult: Secondary | ICD-10-CM | POA: Diagnosis not present

## 2022-05-31 DIAGNOSIS — R7303 Prediabetes: Secondary | ICD-10-CM

## 2022-05-31 DIAGNOSIS — Z6837 Body mass index (BMI) 37.0-37.9, adult: Secondary | ICD-10-CM

## 2022-05-31 MED ORDER — VITAMIN D (ERGOCALCIFEROL) 1.25 MG (50000 UNIT) PO CAPS
50000.0000 [IU] | ORAL_CAPSULE | ORAL | 0 refills | Status: DC
Start: 2022-05-31 — End: 2022-07-26
  Filled 2022-05-31: qty 4, 28d supply, fill #0

## 2022-05-31 NOTE — Progress Notes (Signed)
[name], D.O.  ABFM, ABOM Specializing in Clinical Bariatric Medicine  Office located at: [address] [place], [place]  [address]     Assessment and Plan:   Medications Discontinued During This Encounter  Medication Reason   doxycycline (VIBRA-TABS) 100 MG tablet Completed Course   Vitamin D, Ergocalciferol, (DRISDOL) 1.25 MG (50000 UNIT) CAPS capsule Reorder     Meds ordered this encounter  Medications   Vitamin D, Ergocalciferol, (DRISDOL) 1.25 MG (50000 UNIT) CAPS capsule    Sig: Take 1 capsule (50,000 Units total) by mouth every 7 (seven) days.    Dispense:  4 capsule    Refill:  0     Vitamin D deficiency Assessment: Condition is at goal.  Lab Results  Component Value Date   VD25OH 76.4 02/09/2022   VD25OH 24.4 (L) 05/21/2021  - Reports good compliance and tolerance with Ergocalciferol 50K IU weekly. Denies any side effects.  Plan: - Continue with med. Will refill this today.   - weight loss will likely improve availability of vitamin D, thus encouraged Wayne Jordan to continue with meal plan and their weight loss efforts to further improve this condition.    Prediabetes Assessment: Condition is improving, but not optimized.  Lab Results  Component Value Date   HGBA1C 5.6 02/09/2022   HGBA1C 5.7 (H) 07/13/2021   HGBA1C 5.8 (H) 07/09/2020   INSULIN 20.5 02/09/2022   INSULIN 22.2 05/21/2021  - Patient is not on any prediabetic medication. This is diet controlled. - His hunger and cravings are controlled and he has no concerns.   Plan: - We discussed the  possibility of starting various medication options which can help [place] in the management of this disease process as well as with weight loss.  Will consider starting one of these meds in future as we will focus on prudent nutritional plan at this time.  Wayne Jordan will continue to work on weight loss, exercise, via their meal plan we devised to help decrease the risk of progressing to diabetes.     TREATMENT PLAN FOR OBESITY: BMI 37.0-37.9, adult-current bmi 38.23 Morbid obesity (HCC)-start bmi 39.33/date 05/21/21 Assessment:  Wayne Jordan is here to discuss his progress with his obesity treatment plan along with follow-up of his obesity related diagnoses. See Medical Weight Management Flowsheet for complete bioelectrical impedance results.  Condition is improving, but not optimized.   Fat mass has is 98.4 lbs. Muscle mass is 167.2 lbs.Total body water is 121 lbs. Counseling done on how various foods will affect these numbers and how to maximize success  Weight change since last visit: -2 lbs Total lbs lost to date: 10 Total weight loss percentage to date: 2.84   Plan:  Wayne Jordan is currently in the action stage of change. As such, his goal is to continue his weight management plan. [place] will work on healthier eating habits and  continue the  keeping a food journal and adhering to recommended goals of 1800-1900 calories and 120+ protein and using the Category 4 meal plan as a guide.  - I again reviewed with pt: how to record his daily caloric and protein intake and how to hit his goals of 120+ grams of protein.   Behavioral Intervention Additional resources provided today:  food journaling log Evidence-based interventions for health behavior change were utilized today including the discussion of self monitoring techniques, problem-solving barriers and SMART goal setting techniques.   Regarding patient's less desirable eating habits and patterns, we employed the  technique of small changes.  Pt will specifically work on: journaling intake and bringing log  for next visit.    Recommended Physical Activity Goals [place] has been advised to slowly work up to 150 minutes of moderate intensity aerobic activity a week and strengthening exercises 2-3 times per week for cardiovascular health, weight loss maintenance and preservation of muscle mass.   He has agreed to Continue current level  of physical activity    FOLLOW UP: Return in about 3 weeks (around 06/21/2022). He was informed of the importance of frequent follow up visits to maximize his success with intensive lifestyle modifications for his multiple health conditions.  Subjective:   Chief complaint: Obesity Wayne Jordan is here to discuss his progress with his obesity treatment plan. He is on  the Category 4 Plan and keeping a food journal and adhering to recommended goals of 1800-1900 calories and 120+ protein and states he is following his eating plan approximately +% of the time. He states he is exercising 45 minutes 3 days per week.  Interval History:  Wayne Jordan is here for a follow up office visit.  This is patient's 15th office visit with [place].    Since last office visit:   - He has been journaling his intake on his phone and this has increased his awareness about his food habits. - He fasts on Mondays - reports he has been doing this for years.  - Drinks 2 cheer wines daily. - When eating on plan, his hunger and cravings are well controlled.     Review of Systems:  Pertinent positives were addressed with patient today.  Weight Summary and Biometrics   Weight Lost Since Last Visit: 0  Weight Gained Since Last Visit: 0    Vitals Temp: (!) 97.4 F (36.3 C) BP: 138/74 Pulse Rate: 82 SpO2: 95 %   Anthropometric Measurements Height: 5\' 11"  (1.803 m) Weight: 274 lb (124.3 kg) BMI (Calculated): 38.23 Weight at Last Visit: 274 lb Weight Lost Since Last Visit: 0 Weight Gained Since Last Visit: 0 Starting Weight: 282 lb Total Weight Loss (lbs): 8 lb (3.629 kg) Peak Weight: 290 lb   Body Composition  Body Fat %: 35.9 % Fat Mass (lbs): 98.4 lbs Muscle Mass (lbs): 167.2 lbs Total Body Water (lbs): 121 lbs Visceral Fat Rating : 23   Other Clinical Data Fasting: No Labs: No Today's Visit #: 15 Starting Date: 05/21/21     Objective:   PHYSICAL EXAM: Blood pressure 138/74, pulse 82,  temperature (!) 97.4 F (36.3 C), height 5\' 11"  (1.803 m), weight 274 lb (124.3 kg), SpO2 95 %. Body mass index is 38.22 kg/m.  General: Well Developed, well nourished, and in no acute distress.  HEENT: Normocephalic, atraumatic Skin: Warm and dry, cap RF less 2 sec, good turgor Chest:  Normal excursion, shape, no gross abn Respiratory: speaking in full sentences, no conversational dyspnea NeuroM-Sk: Ambulates w/o assistance, moves * 4 Psych: A and O *3, insight good, mood-full  DIAGNOSTIC DATA REVIEWED:  BMET    Component Value Date/Time   NA 143 02/09/2022 1049   K 4.7 02/09/2022 1049   CL 104 02/09/2022 1049   CO2 22 02/09/2022 1049   GLUCOSE 107 (H) 02/09/2022 1049   GLUCOSE 107 (H) 10/17/2020 1200   BUN 13 02/09/2022 1049   CREATININE 1.05 02/09/2022 1049   CALCIUM 10.1 02/09/2022 1049   GFRNONAA >60 10/17/2020 1200   GFRAA >60 01/08/2019 1145   Lab Results  Component Value Date  HGBA1C 5.6 02/09/2022   HGBA1C 5.6 10/07/2012   Lab Results  Component Value Date   INSULIN 20.5 02/09/2022   INSULIN 22.2 05/21/2021   Lab Results  Component Value Date   TSH 2.600 07/13/2021   CBC    Component Value Date/Time   WBC 6.2 07/13/2021 0938   WBC 6.5 10/17/2020 1200   RBC 5.10 07/13/2021 0938   RBC 5.06 10/17/2020 1200   HGB 15.6 07/13/2021 0938   HCT 45.8 07/13/2021 0938   PLT 199 07/13/2021 0938   MCV 90 07/13/2021 0938   MCH 30.6 07/13/2021 0938   MCH 30.2 10/17/2020 1200   MCHC 34.1 07/13/2021 0938   MCHC 32.7 10/17/2020 1200   RDW 12.8 07/13/2021 0938   Iron Studies No results found for: "IRON", "TIBC", "FERRITIN", "IRONPCTSAT" Lipid Panel     Component Value Date/Time   CHOL 88 (L) 02/09/2022 1049   CHOL CANCELED 11/07/2014 0921   CHOL SEE BELOW 11/07/2014 0921   CHOL 128 04/26/2014 0757   TRIG 101 02/09/2022 1049   TRIG CANCELED 11/07/2014 0921   TRIG SEE BELOW 11/07/2014 0921   TRIG 140 04/26/2014 0757   HDL 41 02/09/2022 1049   HDL  CANCELED 11/07/2014 0921   HDL SEE BELOW 11/07/2014 0921   HDL 39 (L) 04/26/2014 0757   CHOLHDL 2.1 02/09/2022 1049   CHOLHDL 2.6 04/18/2015 0930   VLDL 22 04/18/2015 0930   LDLCALC 28 02/09/2022 1049   LDLCALC CANCELED 11/07/2014 0921   LDLCALC SEE BELOW 11/07/2014 0921   LDLCALC 61 04/26/2014 0757   Hepatic Function Panel     Component Value Date/Time   PROT 7.2 02/09/2022 1049   ALBUMIN 4.6 02/09/2022 1049   AST 26 02/09/2022 1049   ALT 36 02/09/2022 1049   ALKPHOS 112 02/09/2022 1049   BILITOT 0.8 02/09/2022 1049   BILIDIR 0.19 02/21/2017 0934   IBILI 0.5 11/07/2014 0921      Component Value Date/Time   TSH 2.600 07/13/2021 2130   Nutritional Lab Results  Component Value Date   VD25OH 76.4 02/09/2022   VD25OH 24.4 (L) 05/21/2021    Attestations:   Reviewed by clinician on day of visit: allergies, medications, problem list, medical history, surgical history, family history, social history, and previous encounter notes.    I,Special Puri,acting as a Neurosurgeon for [employer], DO.,have documented all relevant documentation on the behalf of Thomasene Lot, DO,as directed by  Thomasene Lot, DO while in the presence of Thomasene Lot, DO.   I, Thomasene Lot, DO, have reviewed all documentation for this visit. The documentation on 05/31/22 for the exam, diagnosis, procedures, and orders are all accurate and complete.

## 2022-06-03 ENCOUNTER — Encounter: Payer: Self-pay | Admitting: Cardiology

## 2022-06-17 DIAGNOSIS — G4733 Obstructive sleep apnea (adult) (pediatric): Secondary | ICD-10-CM | POA: Diagnosis not present

## 2022-06-21 ENCOUNTER — Ambulatory Visit (INDEPENDENT_AMBULATORY_CARE_PROVIDER_SITE_OTHER): Payer: Commercial Managed Care - PPO | Admitting: Family Medicine

## 2022-06-24 ENCOUNTER — Telehealth: Payer: Self-pay | Admitting: Student

## 2022-06-24 ENCOUNTER — Ambulatory Visit: Payer: Commercial Managed Care - PPO | Attending: Cardiology | Admitting: Cardiology

## 2022-06-24 ENCOUNTER — Other Ambulatory Visit: Payer: Self-pay

## 2022-06-24 ENCOUNTER — Telehealth: Payer: Self-pay

## 2022-06-24 ENCOUNTER — Encounter: Payer: Self-pay | Admitting: Cardiology

## 2022-06-24 VITALS — BP 120/64 | HR 76 | Resp 18 | Ht 71.0 in | Wt 276.0 lb

## 2022-06-24 DIAGNOSIS — I25110 Atherosclerotic heart disease of native coronary artery with unstable angina pectoris: Secondary | ICD-10-CM | POA: Diagnosis not present

## 2022-06-24 DIAGNOSIS — R079 Chest pain, unspecified: Secondary | ICD-10-CM

## 2022-06-24 DIAGNOSIS — Z6839 Body mass index (BMI) 39.0-39.9, adult: Secondary | ICD-10-CM | POA: Diagnosis not present

## 2022-06-24 LAB — TROPONIN T: Troponin T (Highly Sensitive): 8 ng/L (ref 0–22)

## 2022-06-24 NOTE — Patient Instructions (Signed)
Medication Instructions:  Your physician recommends that you continue on your current medications as directed. Please refer to the Current Medication list given to you today.  *If you need a refill on your cardiac medications before your next appointment, please call your pharmacy*   Lab Work: Your physician recommends that you return for lab work in: Troponin STAT, will call w If you have labs (blood work) drawn today and your tests are completely normal, you will receive your results only by: MyChart Message (if you have MyChart) OR A paper copy in the mail If you have any lab test that is abnormal or we need to change your treatment, we will call you to review the results.   Testing/Procedures: None  Follow-Up: At [facility], you and your health needs are our priority.  As part of our continuing mission to provide you with exceptional heart care, we have created designated Provider Care Teams.  These Care Teams include your primary Cardiologist (physician) and Advanced Practice Providers (APPs -  Physician Assistants and Nurse Practitioners) who all work together to provide you with the care you need, when you need it.  We recommend signing up for the patient portal called "MyChart".  Sign up information is provided on this After Visit Summary.  MyChart is used to connect with patients for Virtual Visits (Telemedicine).  Patients are able to view lab/test results, encounter notes, upcoming appointments, etc.  Non-urgent messages can be sent to your provider as well.   To learn more about what you can do with MyChart, go to [email or web address].    Your next appointment:      Provider:   Gypsy Balsam, MD    Other Instructions  Medication Instructions:   Continue Plavix     *If you need a refill on your cardiac medications before your next appointment, please call your pharmacy*   Lab Work: CBC & BMP- today  If you have labs (blood work) drawn today and  your tests are completely normal, you will receive your results only by: MyChart Message (if you have MyChart) OR A paper copy in the mail If you have any lab test that is abnormal or we need to change your treatment, we will call you to review the results.   Testing/Procedures:  [facility] [place] [facility] [place] [address] [place] [address] Dept: [phone] Loc: [phone]  Wayne Jordan  06/25/2022  You are scheduled for a Cardiac Catheterization on Monday, June 10 with Dr. Nicki Guadalajara.  1. Please arrive at the [facility] (Main Entrance A) at [facility]: [address] [place], [place] [address] at 5:30 AM (This time is 2 hour(s) before your procedure to ensure your preparation). Free valet parking service is available. You will check in at ADMITTING. The support person will be asked to wait in the waiting room.  It is OK to have someone drop you off and come back when you are ready to be discharged.    Special note: Every effort is made to have your procedure done on time. Please understand that emergencies sometimes delay scheduled procedures.  2. Diet: Do not eat solid foods after midnight.  The patient may have clear liquids until 5am upon the day of the procedure.  3. Labs: You will need to have blood drawn on Friday, June 7 at [facility]: [address], [place], [place] . You do not need to be  fasting.  4. Medication instructions in preparation for your procedure:   Contrast Allergy: No    On the morning of your procedure, take your Aspirin 81 mg and any morning medicines NOT listed above.  You may use sips of water.  5. Plan to go home the same day, you will only stay overnight if medically necessary. 6. Bring a current list of your medications and current insurance cards. 7. You MUST have a responsible person to drive you home. 8. Someone MUST be with you the first 24 hours after you  arrive home or your discharge will be delayed. 9. Please wear clothes that are easy to get on and off and wear slip-on shoes.  Thank you for allowing [place] to care for you!   -- [facility]    Follow-Up: At [facility], you and your health needs are our priority.  As part of our continuing mission to provide you with exceptional heart care, we have created designated Provider Care Teams.  These Care Teams include your primary Cardiologist (physician) and Advanced Practice Providers (APPs -  Physician Assistants and Nurse Practitioners) who all work together to provide you with the care you need, when you need it.  We recommend signing up for the patient portal called "MyChart".  Sign up information is provided on this After Visit Summary.  MyChart is used to connect with patients for Virtual Visits (Telemedicine).  Patients are able to view lab/test results, encounter notes, upcoming appointments, etc.  Non-urgent messages can be sent to your provider as well.   To learn more about what you can do with MyChart, go to [email or web address].    Your next appointment:   2 month(s)  The format for your next appointment:   In Person  Provider:   Gypsy Balsam, MD   Other Instructions  Coronary Angiogram With Stent Coronary angiogram with stent placement is a procedure to widen or open a narrow blood vessel of the heart (coronary artery). Arteries may become blocked by cholesterol buildup (plaques) in the lining of the artery wall. When a coronary artery becomes partially blocked, blood flow to that area decreases. This may lead to chest pain or a heart attack (myocardial infarction). A stent is a small piece of metal that looks like mesh or spring. Stent placement may be done as treatment after a heart attack, or to prevent a heart attack if a blocked artery is found by a coronary angiogram. Let your health care provider know about: Any allergies you  have, including allergies to medicines or contrast dye. All medicines you are taking, including vitamins, herbs, eye drops, creams, and over-the-counter medicines. Any problems you or family members have had with anesthetic medicines. Any blood disorders you have. Any surgeries you have had. Any medical conditions you have, including kidney problems or kidney failure. Whether you are pregnant or may be pregnant. Whether you are breastfeeding. What are the risks? Generally, this is a safe procedure. However, serious problems may occur, including: Damage to nearby structures or organs, such as the heart, blood vessels, or kidneys. A return of blockage. Bleeding, infection, or bruising at the insertion site. A collection of blood under the skin (hematoma) at the insertion site. A blood clot in another part of the body. Allergic reaction to medicines or dyes. Bleeding into the abdomen (retroperitoneal bleeding). Stroke (rare). Heart attack (rare). What happens before the procedure? Staying hydrated Follow instructions from your health care provider about hydration, which may include: Up  to 2 hours before the procedure - you may continue to drink clear liquids, such as water, clear fruit juice, black coffee, and plain tea.    Eating and drinking restrictions Follow instructions from your health care provider about eating and drinking, which may include: 8 hours before the procedure - stop eating heavy meals or foods, such as meat, fried foods, or fatty foods. 6 hours before the procedure - stop eating light meals or foods, such as toast or cereal. 2 hours before the procedure - stop drinking clear liquids. Medicines Ask your health care provider about: Changing or stopping your regular medicines. This is especially important if you are taking diabetes medicines or blood thinners. Taking medicines such as aspirin and ibuprofen. These medicines can thin your blood. Do not take these  medicines unless your health care provider tells you to take them. Generally, aspirin is recommended before a thin tube, called a catheter, is passed through a blood vessel and inserted into the heart (cardiac catheterization). Taking over-the-counter medicines, vitamins, herbs, and supplements. General instructions Do not use any products that contain nicotine or tobacco for at least 4 weeks before the procedure. These products include cigarettes, e-cigarettes, and chewing tobacco. If you need help quitting, ask your health care provider. Plan to have someone take you home from the hospital or clinic. If you will be going home right after the procedure, plan to have someone with you for 24 hours. You may have tests and imaging procedures. Ask your health care provider: How your insertion site will be marked. Ask which artery will be used for the procedure. What steps will be taken to help prevent infection. These may include: Removing hair at the insertion site. Washing skin with a germ-killing soap. Taking antibiotic medicine. What happens during the procedure? An IV will be inserted into one of your veins. Electrodes may be placed on your chest to monitor your heart rate during the procedure. You will be given one or more of the following: A medicine to help you relax (sedative). A medicine to numb the area (local anesthetic) for catheter insertion. A small incision will be made for catheter insertion. The catheter will be inserted into an artery using a guide wire. The location may be in your groin, your wrist, or the fold of your arm (near your elbow). An X-ray procedure (fluoroscopy) will be used to help guide the catheter to the opening of the heart arteries. A dye will be injected into the catheter. X-rays will be taken. The dye helps to show where any narrowing or blockages are located in the arteries. Tell your health care provider if you have chest pain or trouble breathing. A  tiny wire will be guided to the blocked spot, and a balloon will be inflated to make the artery wider. The stent will be expanded to crush the plaques into the wall of the vessel. The stent will hold the area open and improve the blood flow. Most stents have a drug coating to reduce the risk of the stent narrowing over time. The artery may be made wider using a drill, laser, or other tools that remove plaques. The catheter will be removed when the blood flow improves. The stent will stay where it was placed, and the lining of the artery will grow over it. A bandage (dressing) will be placed on the insertion site. Pressure will be applied to stop bleeding. The IV will be removed. This procedure may vary among health care providers and hospitals.  What happens after the procedure? Your blood pressure, heart rate, breathing rate, and blood oxygen level will be monitored until you leave the hospital or clinic. If the procedure is done through the leg, you will lie flat in bed for a few hours or for as long as told by your health care provider. You will be instructed not to bend or cross your legs. The insertion site and the pulse in your foot or wrist will be checked often. You may have more blood tests, X-rays, and a test that records the electrical activity of your heart (electrocardiogram, or ECG). Do not drive for 24 hours if you were given a sedative during your procedure. Summary Coronary angiogram with stent placement is a procedure to widen or open a narrowed coronary artery. This is done to treat heart problems. Before the procedure, let your health care provider know about all the medical conditions and surgeries you have or have had. This is a safe procedure. However, some problems may occur, including damage to nearby structures or organs, bleeding, blood clots, or allergies. Follow your health care provider's instructions about eating, drinking, medicines, and other lifestyle changes,  such as quitting tobacco use before the procedure. This information is not intended to replace advice given to you by your health care provider. Make sure you discuss any questions you have with your health care provider. Document Revised: 07/26/2018 Document Reviewed: 07/26/2018 Elsevier Patient Education  2021 [employer].

## 2022-06-24 NOTE — Telephone Encounter (Signed)
  Received call from [facility] about a stat lab resolved. Patient was seen in the office today by Valentina Shaggy, NP, for chest pain and stat troponin was ordered. This came back negative at 8. Per result note, Wayne Jordan has already updated patient with the result.   Corrin Parker, PA-C 06/24/2022 7:57 PM

## 2022-06-24 NOTE — Progress Notes (Addendum)
Cardiology Office Note:    Date:  06/24/2022   ID:  Wayne Jordan, DOB Jun 18, 1960, MRN [id number]  PCP:  Rema Fendt, NP   [facility] Providers Cardiologist:  Gypsy Balsam, MD     Referring MD: Rema Fendt, NP   Chief Complaint  Patient presents with   Chest Pain    Case reviewed with Wallis Bamberg, NP concurrently with the patient being seen in the office Also discussed with his wife and will call the patient today and get informed consent His a high sensitive troponin is normal he Continues to have bothersome chest pain he had a myocardial perfusion study performed 04/22/2021 showing ischemia involving the basal and mid and apical portion of the inferior wall.  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   History of Present Illness:    Wayne Jordan is a 62 y.o. male with a hx of CAD s/p DES to LAD NSTEMI in 2014, hyperlipidemia, OSA on CPAP.  LHC 2014 DES to LAD.  DOT exam required ischemic evaluation:  MPI 04/22/21 findings consistent with mild ischemia, intermediate risk Coronary CTA 05/14/22, patent stent in LAD, ultimately could not be interpreted. Discussion surrounding proceeding with LHC, however he was completely asymptomatic and ultimately decided he did not want further testing.   He presented today with concerns of chest pain. Stated pain  started shortly after he got up, has been relatively persistent throughout the day. He point to the top of her sternum, just under where his chin would rest. This is not the chest pain he experienced 10 years ago when he had his MI. Pain today is not accompanied by other anginal symptoms. He did take a ntg x 1, states the pain wore off for a few minutes then returned, took a second ntg and felt a flushing sensation to the right side of his neck. EKG obtained in the office is unchanged, no ST elevation noted. He denies palpitations, dyspnea, pnd, orthopnea, n, v, dizziness, syncope, edema, weight gain, or early satiety.   Past Medical History:  Diagnosis Date   Acute myocardial infarction of other anterior wall, initial episode of care    Alcohol abuse    CAD- LAD DES in setting of NSTEMI Sept 2014 04/06/2013   Chest pain    Coronary artery disease    s/P PCI of the LAD with DES.  Nuclear stress test 04/2014 with no ischemia   Displaced fracture of proximal phalanx of left little finger with routine healing 08/01/2018   Drug use    Encounter for long-term (current) use of other medications 04/06/2013   History of left hip replacement    Hypercholesteremia    Hyperlipidemia    Joint pain    Mixed hyperlipidemia 04/06/2013   Morbid obesity due to excess calories (HCC) 05/26/2015   Nondisplaced fracture of proximal phalanx of left great toe, initial encounter for closed fracture 08/01/2018  Obesity, unspecified 10/08/2012   Old MI (myocardial infarction) 04/06/2013   OSA on CPAP    followed by Dr. Jessie Foot   Personal history of urinary calculi    Pre-diabetes    Primary osteoarthritis of left hip 09/20/2018   Primary osteoarthritis of right hip 11/21/2018   Shortness of breath    Sleep apnea    Status post total hip replacement, left 04/10/2019   Status post total replacement of left hip 01/15/2019   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    Umbilical hernia without obstruction and without gangrene  08/25/2020    Past Surgical History:  Procedure Laterality Date   INGUINAL HERNIA REPAIR Right 03/08/2012   Procedure: HERNIA REPAIR INGUINAL ADULT;  Surgeon: Axel Filler, MD;  Location: [facility] OR;  Service: General;  Laterality: Right;   INSERTION OF MESH N/A 03/08/2012   Procedure: INSERTION OF MESH;  Surgeon: Axel Filler, MD;  Location: [facility] OR;  Service: General;  Laterality: N/A;   INSERTION OF MESH N/A 10/22/2020   Procedure: INSERTION OF MESH;  Surgeon: Fritzi Mandes, MD;  Location: [facility] OR;  Service: General;  Laterality: N/A;   LEFT HEART CATHETERIZATION WITH CORONARY ANGIOGRAM Right 10/07/2012   Procedure: LEFT HEART CATHETERIZATION WITH CORONARY ANGIOGRAM;  Surgeon: Corky Crafts, MD;  Location: [facility] CATH LAB;  Service: Cardiovascular;  Laterality: Right;   METACARPAL OSTEOTOMY Left 11/01/2018   Procedure: Left small finger osteophyte removal;  Surgeon: Tarry Kos, MD;  Location: [facility];  Service: Orthopedics;  Laterality: Left;   PERCUTANEOUS STENT INTERVENTION  10/07/2012   Procedure: PERCUTANEOUS STENT INTERVENTION;  Surgeon: Corky Crafts, MD;  Location: [facility] C[facility] LAB;  Service: Cardiovascular;;   TOTAL HIP ARTHROPLASTY Left 01/15/2019   Procedure: LEFT TOTAL HIP ARTHROPLASTY ANTERIOR APPROACH;  Surgeon: Tarry Kos, MD;  Location: [facility] OR;  Service: Orthopedics;  Laterality: Left;   UMBILICAL HERNIA REPAIR N/A 10/22/2020   Procedure: OPEN UMBILICAL HERNIA REPAIR WITH MESH;  Surgeon: Fritzi Mandes, MD;  Location: [facility] OR;  Service: General;  Laterality: N/A;    Current Medications: Current Meds  Medication Sig   amLODipine (NORVASC) 5 MG tablet Take 1 tablet (5 mg total) by mouth daily.   aspirin EC 81 MG tablet Take 1 tablet (81 mg total) by mouth daily. Swallow whole.   atorvastatin (LIPITOR) 80 MG tablet Take 1 tablet (80 mg total) by mouth daily.   clopidogrel (PLAVIX) 75 MG tablet Take 1 tablet (75 mg total) by mouth daily.   Multiple  Vitamin (MULTI-VITAMIN DAILY PO) Take 1 tablet by mouth daily.   nitroGLYCERIN (NITROSTAT) 0.4 MG SL tablet Place 1 tablet (0.4 mg total) under the tongue every 5 (five) minutes x 3 doses as needed for chest pain.   ondansetron (ZOFRAN-ODT) 4 MG disintegrating tablet Take 1 tablet (4 mg total) by mouth every 8 (eight) hours as needed for nausea or vomiting.   Vitamin D, Ergocalciferol, (DRISDOL) 1.25 MG (50000 UNIT) CAPS capsule Take 1 capsule (50,000 Units total) by mouth every 7 (seven) days.   zinc gluconate 50 MG tablet Take 50 mg by mouth daily.     Allergies:   Coconut fatty acids and Penicillins   Social History   Socioeconomic History   Marital status: Married    Spouse name: Wayne Jordan   Number of children: Not on file   Years of education: Not on file   Highest education level: Not on file  Occupational History   Occupation: Retired  Tobacco  Use   Smoking status: Former    Packs/day: 1.50    Years: 7.00    Additional pack years: 0.00    Total pack years: 10.50    Types: Cigarettes    Quit date: 10/01/2012    Years since quitting: 9.7    Passive exposure: Past   Smokeless tobacco: Never  Vaping Use   Vaping Use: Never used  Substance and Sexual Activity   Alcohol use: Not Currently    Comment: occassionally   Drug use: No   Sexual activity: Yes  Other Topics Concern   Not on file  Social History Narrative   Drinks 2 cheerwines daily.   Social Determinants of Health   Financial Resource Strain: Not on file  Food Insecurity: Not on file  Transportation Needs: Not on file  Physical Activity: Not on file  Stress: Not on file  Social Connections: Not on file     Family History: The patient's family history includes CAD in his father; Heart attack in his father; Heart disease in his father.  ROS:   Please see the history of present illness.     All other systems reviewed and are negative.  EKGs/Labs/Other Studies Reviewed:    The following studies were  reviewed today:   EKG:  EKG is  ordered today.  The ekg ordered today demonstrates NSR, HR 76 bpm, no changes from prior EKG tracing.   Recent Labs: 07/13/2021: Hemoglobin 15.6; Platelets 199; TSH 2.600 02/09/2022: ALT 36; BUN 13; Creatinine, Ser 1.05; Potassium 4.7; Sodium 143  Recent Lipid Panel    Component Value Date/Time   CHOL 88 (L) 02/09/2022 1049   CHOL CANCELED 11/07/2014 0921   CHOL SEE BELOW 11/07/2014 0921   CHOL 128 04/26/2014 0757   TRIG 101 02/09/2022 1049   TRIG CANCELED 11/07/2014 0921   TRIG SEE BELOW 11/07/2014 0921   TRIG 140 04/26/2014 0757   HDL 41 02/09/2022 1049   HDL CANCELED 11/07/2014 0921   HDL SEE BELOW 11/07/2014 0921   HDL 39 (L) 04/26/2014 0757   CHOLHDL 2.1 02/09/2022 1049   CHOLHDL 2.6 04/18/2015 0930   VLDL 22 04/18/2015 0930   LDLCALC 28 02/09/2022 1049   LDLCALC CANCELED 11/07/2014 0921   LDLCALC SEE BELOW 11/07/2014 0921   LDLCALC 61 04/26/2014 0757     Risk Assessment/Calculations:                Physical Exam:    VS:  BP 120/64 (BP Location: Left Arm, Patient Position: Sitting)   Pulse 76   Resp 18   Ht 5\' 11"  (1.803 m)   Wt 276 lb (125.2 kg)   SpO2 96%   BMI 38.49 kg/m     Wt Readings from Last 3 Encounters:  06/24/22 276 lb (125.2 kg)  05/31/22 274 lb (124.3 kg)  05/25/22 276 lb (125.2 kg)     GEN:  Well nourished, well developed in no acute distress HEENT: Normal NECK: No JVD; No carotid bruits LYMPHATICS: No lymphadenopathy CARDIAC: RRR, no murmurs, rubs, gallops RESPIRATORY:  Clear to auscultation without rales, wheezing or rhonchi  ABDOMEN: Soft, non-tender, non-distended MUSCULOSKELETAL:  No edema; No deformity  SKIN: Warm and dry NEUROLOGIC:  Alert and oriented x 3 PSYCHIATRIC:  Normal affect   ASSESSMENT:    1. Chest pain of uncertain etiology   2. Coronary artery disease involving native coronary artery of native heart with unstable angina pectoris (HCC)    PLAN:    In order of problems  listed  above:  CAD/chest pain of uncertain origin - s/p DES to LAD in 2014; abnormal MPI > coronary CTA could not be read. Continue ASA 81 mg daily, Plavix 75 mg daily, atorvastatin 80 mg daily, NTG PRN -- used x 2 today. Chest pain has mixed features and is not consistent with prior MI. Will check stat troponin. Discussed that if abnormal, he will need to proceed to the ED for further evaluation and treatment.   Addendum 09/02/22 Obesity with a BMI > 38 - He has tried multiple diets with little results. He has changed his dietary intake signifigantly and has not lost more than 5% of his body weight, this has been very frustrating for him. He has met with [facility] for counseling as well. Will plan to start Wegovy to help augment his weight loss and hopefully help further reduce episodes of coronary event.      Disposition - stat troponin, ED precautions discussed. Pt and his wife voiced understanding.      **addendum 06/25/22 @ 1326, discussed at the time of visit with Dr. Dulce Sellar, troponin was pending but resulted normal. Planned to bring back in today to discuss further need for ischemic evaluation with Dr. Dulce Sellar.   Medication Adjustments/Labs and Tests Ordered: Current medicines are reviewed at length with the patient today.  Concerns regarding medicines are outlined above.  Orders Placed This Encounter  Procedures   EKG 12-Lead   No orders of the defined types were placed in this encounter.   Patient Instructions  Medication Instructions:  Your physician recommends that you continue on your current medications as directed. Please refer to the Current Medication list given to you today.  *If you need a refill on your cardiac medications before your next appointment, please call your pharmacy*   Lab Work: Your physician recommends that you return for lab work in: Troponin STAT, will call w If you have labs (blood work) drawn today and your tests are completely normal, you  will receive your results only by: MyChart Message (if you have MyChart) OR A paper copy in the mail If you have any lab test that is abnormal or we need to change your treatment, we will call you to review the results.   Testing/Procedures: None  Follow-Up: At [facility], you and your health needs are our priority.  As part of our continuing mission to provide you with exceptional heart care, we have created designated Provider Care Teams.  These Care Teams include your primary Cardiologist (physician) and Advanced Practice Providers (APPs -  Physician Assistants and Nurse Practitioners) who all work together to provide you with the care you need, when you need it.  We recommend signing up for the patient portal called "MyChart".  Sign up information is provided on this After Visit Summary.  MyChart is used to connect with patients for Virtual Visits (Telemedicine).  Patients are able to view lab/test results, encounter notes, upcoming appointments, etc.  Non-urgent messages can be sent to your provider as well.   To learn more about what you can do with MyChart, go to [email or web address].    Your next appointment:      Provider:   Gypsy Balsam, MD    Other Instructions     Signed, Flossie Dibble, NP  06/24/2022 6:05 PM    [facility]

## 2022-06-24 NOTE — Addendum Note (Signed)
Addended by: Heywood Bene on: 06/24/2022 03:32 PM   Modules accepted: Orders

## 2022-06-24 NOTE — Telephone Encounter (Signed)
Patient called with c/o chest pain, advised per Wayne Jordan come in for Troponin STAT

## 2022-06-25 DIAGNOSIS — R079 Chest pain, unspecified: Secondary | ICD-10-CM | POA: Diagnosis not present

## 2022-06-25 NOTE — Addendum Note (Signed)
Addended by: Norman Herrlich on: 06/25/2022 10:54 AM   Modules accepted: Orders

## 2022-06-25 NOTE — Addendum Note (Signed)
Addended by: Baldo Ash D on: 06/25/2022 09:11 AM   Modules accepted: Orders

## 2022-06-25 NOTE — Addendum Note (Signed)
Addended by: Baldo Ash D on: 06/25/2022 08:32 AM   Modules accepted: Orders

## 2022-06-25 NOTE — Addendum Note (Signed)
Addended by: Norman Herrlich on: 06/25/2022 10:51 AM   Modules accepted: Orders

## 2022-06-26 LAB — CBC
Hematocrit: 47.7 % (ref 37.5–51.0)
Hemoglobin: 15.5 g/dL (ref 13.0–17.7)
MCH: 29.4 pg (ref 26.6–33.0)
MCHC: 32.5 g/dL (ref 31.5–35.7)
MCV: 91 fL (ref 79–97)
Platelets: 186 x10E3/uL (ref 150–450)
RBC: 5.27 x10E6/uL (ref 4.14–5.80)
RDW: 12.8 % (ref 11.6–15.4)
WBC: 7.9 x10E3/uL (ref 3.4–10.8)

## 2022-06-26 LAB — BASIC METABOLIC PANEL WITH GFR
BUN/Creatinine Ratio: 10 (ref 10–24)
BUN: 10 mg/dL (ref 8–27)
CO2: 23 mmol/L (ref 20–29)
Calcium: 9.5 mg/dL (ref 8.6–10.2)
Chloride: 104 mmol/L (ref 96–106)
Creatinine, Ser: 0.98 mg/dL (ref 0.76–1.27)
Glucose: 143 mg/dL — ABNORMAL HIGH (ref 70–99)
Potassium: 4.2 mmol/L (ref 3.5–5.2)
Sodium: 139 mmol/L (ref 134–144)
eGFR: 87 mL/min/1.73

## 2022-06-28 ENCOUNTER — Ambulatory Visit (INDEPENDENT_AMBULATORY_CARE_PROVIDER_SITE_OTHER): Payer: Commercial Managed Care - PPO | Admitting: Family Medicine

## 2022-06-28 ENCOUNTER — Encounter (HOSPITAL_COMMUNITY)
Admission: RE | Disposition: A | Discharge status: Home / Self Care | Payer: Self-pay | Source: Home / Self Care | Attending: Cardiovascular Disease

## 2022-06-28 ENCOUNTER — Ambulatory Visit (HOSPITAL_COMMUNITY)
Admission: RE | Admit: 2022-06-28 | Discharge: 2022-06-28 | Disposition: A | Discharge status: Home / Self Care | Payer: Commercial Managed Care - PPO | Attending: Cardiovascular Disease | Admitting: Cardiovascular Disease

## 2022-06-28 ENCOUNTER — Encounter (INDEPENDENT_AMBULATORY_CARE_PROVIDER_SITE_OTHER): Payer: Self-pay

## 2022-06-28 ENCOUNTER — Other Ambulatory Visit: Payer: Self-pay

## 2022-06-28 DIAGNOSIS — I2510 Atherosclerotic heart disease of native coronary artery without angina pectoris: Secondary | ICD-10-CM | POA: Diagnosis not present

## 2022-06-28 DIAGNOSIS — R079 Chest pain, unspecified: Secondary | ICD-10-CM

## 2022-06-28 DIAGNOSIS — Z7982 Long term (current) use of aspirin: Secondary | ICD-10-CM | POA: Diagnosis not present

## 2022-06-28 DIAGNOSIS — G4733 Obstructive sleep apnea (adult) (pediatric): Secondary | ICD-10-CM | POA: Insufficient documentation

## 2022-06-28 DIAGNOSIS — I10 Essential (primary) hypertension: Secondary | ICD-10-CM | POA: Diagnosis not present

## 2022-06-28 DIAGNOSIS — I25110 Atherosclerotic heart disease of native coronary artery with unstable angina pectoris: Secondary | ICD-10-CM

## 2022-06-28 DIAGNOSIS — E119 Type 2 diabetes mellitus without complications: Secondary | ICD-10-CM | POA: Diagnosis not present

## 2022-06-28 DIAGNOSIS — Z7902 Long term (current) use of antithrombotics/antiplatelets: Secondary | ICD-10-CM | POA: Insufficient documentation

## 2022-06-28 DIAGNOSIS — Z955 Presence of coronary angioplasty implant and graft: Secondary | ICD-10-CM | POA: Diagnosis not present

## 2022-06-28 HISTORY — PX: LEFT HEART CATH AND CORONARY ANGIOGRAPHY: CATH118249

## 2022-06-28 SURGERY — LEFT HEART CATH AND CORONARY ANGIOGRAPHY
Anesthesia: LOCAL

## 2022-06-28 MED ORDER — SODIUM CHLORIDE 0.9% FLUSH: 3.0000 mL | Freq: Two times a day (BID) | INTRAVENOUS | Status: DC

## 2022-06-28 MED ORDER — SODIUM CHLORIDE 0.9% FLUSH: 3.0000 mL | INTRAVENOUS | Status: DC | PRN

## 2022-06-28 MED ORDER — HEPARIN SODIUM (PORCINE) 1000 UNIT/ML IJ SOLN
INTRAMUSCULAR | Status: DC | PRN
  Administered 2022-06-28: 6300 [IU] via INTRAVENOUS

## 2022-06-28 MED ORDER — LABETALOL HCL 5 MG/ML IV SOLN: 10.0000 mg | INTRAVENOUS | Status: DC | PRN

## 2022-06-28 MED ORDER — SODIUM CHLORIDE 0.9 % WEIGHT BASED INFUSION
3.0000 mL/kg/h | INTRAVENOUS | Status: AC
  Administered 2022-06-28: 3 mL/kg/h via INTRAVENOUS

## 2022-06-28 MED ORDER — FENTANYL CITRATE (PF) 100 MCG/2ML IJ SOLN
INTRAMUSCULAR | Status: DC | PRN
  Administered 2022-06-28: 25 ug via INTRAVENOUS

## 2022-06-28 MED ORDER — SODIUM CHLORIDE 0.9 % IV SOLN: INTRAVENOUS | Status: DC

## 2022-06-28 MED ORDER — LIDOCAINE HCL (PF) 1 % IJ SOLN
INTRAMUSCULAR | Status: AC
  Filled 2022-06-28: qty 30

## 2022-06-28 MED ORDER — HYDRALAZINE HCL 20 MG/ML IJ SOLN: 10.0000 mg | INTRAMUSCULAR | Status: DC | PRN

## 2022-06-28 MED ORDER — HEPARIN (PORCINE) IN NACL 1000-0.9 UT/500ML-% IV SOLN
INTRAVENOUS | Status: DC | PRN
  Administered 2022-06-28 (×2): 500 mL

## 2022-06-28 MED ORDER — LIDOCAINE HCL (PF) 1 % IJ SOLN
INTRAMUSCULAR | Status: DC | PRN
  Administered 2022-06-28: 5 mL via INTRADERMAL

## 2022-06-28 MED ORDER — SODIUM CHLORIDE 0.9 % WEIGHT BASED INFUSION: 1.0000 mL/kg/h | INTRAVENOUS | Status: DC

## 2022-06-28 MED ORDER — ASPIRIN 81 MG PO CHEW: 81.0000 mg | CHEWABLE_TABLET | Freq: Every day | ORAL | Status: DC

## 2022-06-28 MED ORDER — VERAPAMIL HCL 2.5 MG/ML IV SOLN
INTRAVENOUS | Status: DC | PRN
  Administered 2022-06-28: 10 mL via INTRA_ARTERIAL

## 2022-06-28 MED ORDER — CLOPIDOGREL BISULFATE 75 MG PO TABS
75.0000 mg | ORAL_TABLET | Freq: Once | ORAL | Status: AC
  Administered 2022-06-28: 75 mg via ORAL
  Filled 2022-06-28: qty 1

## 2022-06-28 MED ORDER — MIDAZOLAM HCL 2 MG/2ML IJ SOLN
INTRAMUSCULAR | Status: AC
  Filled 2022-06-28: qty 2

## 2022-06-28 MED ORDER — SODIUM CHLORIDE 0.9 % IV SOLN: 250.0000 mL | INTRAVENOUS | Status: DC | PRN

## 2022-06-28 MED ORDER — MIDAZOLAM HCL 2 MG/2ML IJ SOLN
INTRAMUSCULAR | Status: DC | PRN
  Administered 2022-06-28: 2 mg via INTRAVENOUS

## 2022-06-28 MED ORDER — HEPARIN SODIUM (PORCINE) 1000 UNIT/ML IJ SOLN
INTRAMUSCULAR | Status: AC
  Filled 2022-06-28: qty 10

## 2022-06-28 MED ORDER — ACETAMINOPHEN 325 MG PO TABS: 650.0000 mg | ORAL_TABLET | ORAL | Status: DC | PRN

## 2022-06-28 MED ORDER — VERAPAMIL HCL 2.5 MG/ML IV SOLN
INTRAVENOUS | Status: AC
  Filled 2022-06-28: qty 2

## 2022-06-28 MED ORDER — IOHEXOL 350 MG/ML SOLN
INTRAVENOUS | Status: DC | PRN
  Administered 2022-06-28: 75 mL

## 2022-06-28 MED ORDER — FENTANYL CITRATE (PF) 100 MCG/2ML IJ SOLN
INTRAMUSCULAR | Status: AC
  Filled 2022-06-28: qty 2

## 2022-06-28 MED ORDER — CLOPIDOGREL BISULFATE 75 MG PO TABS
75.0000 mg | ORAL_TABLET | Freq: Every day | ORAL | Status: DC
Start: 2022-06-28 — End: 2022-06-28

## 2022-06-28 MED ORDER — ONDANSETRON HCL 4 MG/2ML IJ SOLN: 4.0000 mg | Freq: Four times a day (QID) | INTRAMUSCULAR | Status: DC | PRN

## 2022-06-28 SURGICAL SUPPLY — 13 items
BAND CMPR LRG ZPHR (HEMOSTASIS) ×1
BAND ZEPHYR COMPRESS 30 LONG (HEMOSTASIS) IMPLANT
CATH INFINITI JR4 5F (CATHETERS) IMPLANT
CATH OPTITORQUE TIG 4.0 5F (CATHETERS) IMPLANT
GLIDESHEATH SLEND SS 6F .021 (SHEATH) IMPLANT
GUIDEWIRE INQWIRE 1.5J.035X260 (WIRE) IMPLANT
INQWIRE 1.5J .035X260CM (WIRE) ×1
KIT HEART LEFT (KITS) ×2 IMPLANT
PACK CARDIAC CATHETERIZATION (CUSTOM PROCEDURE TRAY) ×2 IMPLANT
SHEATH PROBE COVER 6X72 (BAG) IMPLANT
TRANSDUCER W/STOPCOCK (MISCELLANEOUS) ×2 IMPLANT
TUBING CIL FLEX 10 FLL-RA (TUBING) ×2 IMPLANT
WIRE MICROINTRODUCER 60CM (WIRE) IMPLANT

## 2022-06-29 ENCOUNTER — Encounter (HOSPITAL_COMMUNITY): Payer: Self-pay | Admitting: Cardiovascular Disease

## 2022-07-18 DIAGNOSIS — G4733 Obstructive sleep apnea (adult) (pediatric): Secondary | ICD-10-CM | POA: Diagnosis not present

## 2022-07-26 ENCOUNTER — Ambulatory Visit (INDEPENDENT_AMBULATORY_CARE_PROVIDER_SITE_OTHER): Payer: Commercial Managed Care - PPO | Admitting: Family Medicine

## 2022-07-26 ENCOUNTER — Encounter (INDEPENDENT_AMBULATORY_CARE_PROVIDER_SITE_OTHER): Payer: Self-pay | Admitting: Family Medicine

## 2022-07-26 ENCOUNTER — Other Ambulatory Visit (HOSPITAL_COMMUNITY): Payer: Self-pay

## 2022-07-26 VITALS — BP 132/82 | HR 93 | Temp 97.5°F | Ht 71.0 in | Wt 264.0 lb

## 2022-07-26 DIAGNOSIS — Z6836 Body mass index (BMI) 36.0-36.9, adult: Secondary | ICD-10-CM | POA: Diagnosis not present

## 2022-07-26 DIAGNOSIS — E559 Vitamin D deficiency, unspecified: Secondary | ICD-10-CM | POA: Diagnosis not present

## 2022-07-26 DIAGNOSIS — R7303 Prediabetes: Secondary | ICD-10-CM | POA: Diagnosis not present

## 2022-07-26 MED ORDER — VITAMIN D (ERGOCALCIFEROL) 1.25 MG (50000 UNIT) PO CAPS
50000.0000 [IU] | ORAL_CAPSULE | ORAL | 0 refills | Status: DC
Start: 2022-07-26 — End: 2022-09-13
  Filled 2022-07-26: qty 4, 28d supply, fill #0

## 2022-07-26 NOTE — Progress Notes (Signed)
Wayne Jordan, D.O.  ABFM, ABOM Specializing in Clinical Bariatric Medicine  Office located at: [address] [place], [place]  [address]     Assessment and Plan:   Medications Discontinued During This Encounter  Medication Reason   Vitamin D, Ergocalciferol, (DRISDOL) 1.25 MG (50000 UNIT) CAPS capsule Reorder     Meds ordered this encounter  Medications   Vitamin D, Ergocalciferol, (DRISDOL) 1.25 MG (50000 UNIT) CAPS capsule    Sig: Take 1 capsule by mouth every 7 days.    Dispense:  4 capsule    Refill:  0     Obtain fasting labs (FLP, CMP, A1c, and Insulin) next OV   Prediabetes Assessment: Condition is improving, but not optimized. His prediabetes is diet/exercise controlled. His hunger and cravings are pretty well controlled when following the prudent nutritional plan. He endorses that his cardiology team recently "okayed him to start [facility]".   Lab Results  Component Value Date   HGBA1C 5.6 02/09/2022   HGBA1C 5.7 (H) 07/13/2021   HGBA1C 5.8 (H) 07/09/2020   INSULIN 20.5 02/09/2022   INSULIN 22.2 05/21/2021    Plan: We again discussed the possibility of starting Wegovy, which can help [place] in the management of this disease process as well as with weight loss.  Will consider starting medication next OV as we will focus on prudent nutritional plan at this time.   Continue his prudent nutritional plan that is low in simple carbohydrates, saturated fats and trans fats to goal of 5-10% weight loss to achieve significant health benefits.  Pt encouraged to continually advance exercise and cardiovascular fitness as tolerated throughout weight loss journey.    Vitamin D deficiency Assessment: His Vitamin D levels are within the recommended range of 50-80. He is taking Ergocalciferol 50K IU weekly and is tolerating supplement well with no complications.  Lab Results  Component Value Date   VD25OH 76.4 02/09/2022   VD25OH 24.4 (L) 05/21/2021   Plan: Continue  with prescribed supplement at current dose. Will refill ERGO today.    We will continue to monitor levels regularly (every 3-4 mo on average) to keep levels within normal limits and prevent over supplementation.   TREATMENT PLAN FOR OBESITY: BMI 36.0-36.9,adult- current bmi 36.84 Morbid obesity (HCC)-start bmi 39.33/date 05/21/21 Assessment:  Wayne Jordan is here to discuss his progress with his obesity treatment plan along with follow-up of his obesity related diagnoses. See Medical Weight Management Flowsheet for complete bioelectrical impedance results.  Condition is not optimized. Biometric data collected today, was reviewed with patient.   Since last office visit on 05/31/22 patient's  Muscle mass has decreased by 2.6 lb. Fat mass has decreased by 7 lb. Total body water has decreased by 4.2 lb.  Counseling done on how various foods will affect these numbers and how to maximize success  Total lbs lost to date: -18 Total weight loss percentage to date: 6.38   Plan: Continue the  keeping a food journal and adhering to recommended goals of 1800-1900 calories and 120+ protein and using the Category 4 meal plan as a guide.   - I again encouraged Wayne Jordan to journal his intake for increased mindfulness and awareness of his food intake.   Behavioral Intervention Additional resources provided today:  food journaling log handout Evidence-based interventions for health behavior change were utilized today including the discussion of self monitoring techniques, problem-solving barriers and SMART goal setting techniques.   Regarding patient's less desirable eating habits and patterns, we  employed the technique of small changes.  Pt will specifically work on: continue with prescribed meal plan/ current exercise regiment for next visit.    Recommended Physical Activity Goals  Wayne Jordan has been advised to slowly work up to 150 minutes of moderate intensity aerobic activity a week and strengthening  exercises 2-3 times per week for cardiovascular health, weight loss maintenance and preservation of muscle mass.   He has agreed to Continue current level of physical activity   FOLLOW UP: Return in about 3 weeks (around 08/16/2022). He was informed of the importance of frequent follow up visits to maximize his success with intensive lifestyle modifications for his multiple health conditions.  Subjective:   Chief complaint: Obesity Wayne Jordan is here to discuss his progress with his obesity treatment plan.  He is on  the Category 4 Plan and keeping a food journal and adhering to recommended goals of 1800-1900 calories and 120+ protein and states he is following his eating plan approximately 0% of the time. He states he is going to the gym 45-60 minutes 3 days per week.  Interval History:  Wayne Jordan is here for a follow up office visit.  Since last OV, Wayne Jordan returned from his trip to [place] on June 26th. He endorses taking Doxycycline, which he stopped 6 days ago, and experienced side effects from the drug such as nausea and "feeling tired all the time"; however he notes that his symptoms are improving. He endorses not following the meal plan for closely due to his vacation and the side effects from the Doxycycline, but expresses interest in wanting to get back on track.   Review of Systems:  Pertinent positives were addressed with patient today.  Reviewed by clinician on day of visit: allergies, medications, problem list, medical history, surgical history, family history, social history, and previous encounter notes.  Weight Summary and Biometrics   Weight Lost Since Last Visit: 10lb  Weight Gained Since Last Visit: 0    Vitals Temp: (!) 97.5 F (36.4 C) BP: 132/82 Pulse Rate: 93 SpO2: 96 %   Anthropometric Measurements Height: 5\' 11"  (1.803 m) Weight: 264 lb (119.7 kg) BMI (Calculated): 36.84 Weight at Last Visit: 274lb Weight Lost Since Last Visit: 10lb Weight Gained  Since Last Visit: 0 Starting Weight: 282lb Total Weight Loss (lbs): 18 lb (8.165 kg) Peak Weight: 290lb   Body Composition  Body Fat %: 34.6 % Fat Mass (lbs): 91.4 lbs Muscle Mass (lbs): 164.6 lbs Total Body Water (lbs): 116.8 lbs Visceral Fat Rating : 21   Other Clinical Data Fasting: no Labs: no Today's Visit #: 16 Starting Date: 05/21/21   Objective:   PHYSICAL EXAM: Blood pressure 132/82, pulse 93, temperature (!) 97.5 F (36.4 C), height 5\' 11"  (1.803 m), weight 264 lb (119.7 kg), SpO2 96 %. Body mass index is 36.82 kg/m.  General: Well Developed, well nourished, and in no acute distress.  HEENT: Normocephalic, atraumatic Skin: Warm and dry, cap RF less 2 sec, good turgor Chest:  Normal excursion, shape, no gross abn Respiratory: speaking in full sentences, no conversational dyspnea NeuroM-Sk: Ambulates w/o assistance, moves * 4 Psych: A and O *3, insight good, mood-full  DIAGNOSTIC DATA REVIEWED:  BMET    Component Value Date/Time   NA 139 06/25/2022 1006   K 4.2 06/25/2022 1006   CL 104 06/25/2022 1006   CO2 23 06/25/2022 1006   GLUCOSE 143 (H) 06/25/2022 1006   GLUCOSE 107 (H) 10/17/2020 1200   BUN 10 06/25/2022  1006   CREATININE 0.98 06/25/2022 1006   CALCIUM 9.5 06/25/2022 1006   GFRNONAA >60 10/17/2020 1200   GFRAA >60 01/08/2019 1145   Lab Results  Component Value Date   HGBA1C 5.6 02/09/2022   HGBA1C 5.6 10/07/2012   Lab Results  Component Value Date   INSULIN 20.5 02/09/2022   INSULIN 22.2 05/21/2021   Lab Results  Component Value Date   TSH 2.600 07/13/2021   CBC    Component Value Date/Time   WBC 7.9 06/25/2022 1006   WBC 6.5 10/17/2020 1200   RBC 5.27 06/25/2022 1006   RBC 5.06 10/17/2020 1200   HGB 15.5 06/25/2022 1006   HCT 47.7 06/25/2022 1006   PLT 186 06/25/2022 1006   MCV 91 06/25/2022 1006   MCH 29.4 06/25/2022 1006   MCH 30.2 10/17/2020 1200   MCHC 32.5 06/25/2022 1006   MCHC 32.7 10/17/2020 1200   RDW 12.8  06/25/2022 1006   Iron Studies No results found for: "IRON", "TIBC", "FERRITIN", "IRONPCTSAT" Lipid Panel     Component Value Date/Time   CHOL 88 (L) 02/09/2022 1049   CHOL CANCELED 11/07/2014 0921   CHOL SEE BELOW 11/07/2014 0921   CHOL 128 04/26/2014 0757   TRIG 101 02/09/2022 1049   TRIG CANCELED 11/07/2014 0921   TRIG SEE BELOW 11/07/2014 0921   TRIG 140 04/26/2014 0757   HDL 41 02/09/2022 1049   HDL CANCELED 11/07/2014 0921   HDL SEE BELOW 11/07/2014 0921   HDL 39 (L) 04/26/2014 0757   CHOLHDL 2.1 02/09/2022 1049   CHOLHDL 2.6 04/18/2015 0930   VLDL 22 04/18/2015 0930   LDLCALC 28 02/09/2022 1049   LDLCALC CANCELED 11/07/2014 0921   LDLCALC SEE BELOW 11/07/2014 0921   LDLCALC 61 04/26/2014 0757   Hepatic Function Panel     Component Value Date/Time   PROT 7.2 02/09/2022 1049   ALBUMIN 4.6 02/09/2022 1049   AST 26 02/09/2022 1049   ALT 36 02/09/2022 1049   ALKPHOS 112 02/09/2022 1049   BILITOT 0.8 02/09/2022 1049   BILIDIR 0.19 02/21/2017 0934   IBILI 0.5 11/07/2014 0921      Component Value Date/Time   TSH 2.600 07/13/2021 1610   Nutritional Lab Results  Component Value Date   VD25OH 76.4 02/09/2022   VD25OH 24.4 (L) 05/21/2021    Attestations:   Patient was in the office today and time spent on visit including pre-visit chart review and post-visit care/coordination of care and electronic medical record documentation was 40 minutes. 50% of the time was in face to face counseling of this patient's medical condition(s) and providing education on treatment options to include the first-line treatment of diet and lifestyle modification.   I, Special Randolm Idol, acting as a Stage manager for [employer], DO., have compiled all relevant documentation for today's office visit on behalf of Thomasene Lot, DO, while in the presence of [employer], DO.  I have reviewed the above documentation for accuracy and completeness, and I agree with the above. Wayne Jordan, D.O.  The 21st Century Cures Act was signed into law in 2016 which includes the topic of electronic health records.  This provides immediate access to information in MyChart.  This includes consultation notes, operative notes, office notes, lab results and pathology reports.  If you have any questions about what you read please let us know at your next visit so we can discuss your concerns and take corrective action if need be.  We are right here  with you.

## 2022-07-29 ENCOUNTER — Encounter: Payer: Self-pay | Admitting: Cardiology

## 2022-08-03 ENCOUNTER — Other Ambulatory Visit (HOSPITAL_COMMUNITY): Payer: Self-pay

## 2022-08-03 ENCOUNTER — Other Ambulatory Visit: Payer: Self-pay

## 2022-08-03 MED ORDER — AMLODIPINE BESYLATE 5 MG PO TABS
5.0000 mg | ORAL_TABLET | Freq: Every day | ORAL | 3 refills | Status: DC
  Filled 2022-08-03 – 2022-08-13 (×2): qty 90, 90d supply, fill #0
  Filled 2022-10-29: qty 90, 90d supply, fill #1

## 2022-08-12 ENCOUNTER — Other Ambulatory Visit (HOSPITAL_COMMUNITY): Payer: Self-pay

## 2022-08-13 ENCOUNTER — Other Ambulatory Visit: Payer: Self-pay

## 2022-08-13 ENCOUNTER — Other Ambulatory Visit (HOSPITAL_COMMUNITY): Payer: Self-pay

## 2022-08-13 ENCOUNTER — Telehealth: Payer: Self-pay

## 2022-08-13 NOTE — Telephone Encounter (Signed)
Pt called wanting to start [facility] for weight loss due to his heart issues. Will send to Dr. Bing Matter for further instruction.

## 2022-08-16 ENCOUNTER — Encounter (INDEPENDENT_AMBULATORY_CARE_PROVIDER_SITE_OTHER): Payer: Self-pay

## 2022-08-16 ENCOUNTER — Ambulatory Visit (INDEPENDENT_AMBULATORY_CARE_PROVIDER_SITE_OTHER): Payer: Commercial Managed Care - PPO | Admitting: Family Medicine

## 2022-08-17 ENCOUNTER — Other Ambulatory Visit (HOSPITAL_COMMUNITY): Payer: Self-pay

## 2022-08-17 DIAGNOSIS — G4733 Obstructive sleep apnea (adult) (pediatric): Secondary | ICD-10-CM | POA: Diagnosis not present

## 2022-09-02 ENCOUNTER — Other Ambulatory Visit (HOSPITAL_COMMUNITY): Payer: Self-pay

## 2022-09-02 MED ORDER — SEMAGLUTIDE-WEIGHT MANAGEMENT 0.5 MG/0.5ML ~~LOC~~ SOAJ
0.5000 mg | SUBCUTANEOUS | 0 refills | Status: AC
  Filled 2022-09-02: qty 2, 28d supply, fill #0

## 2022-09-02 MED ORDER — SEMAGLUTIDE-WEIGHT MANAGEMENT 2.4 MG/0.75ML ~~LOC~~ SOAJ
2.4000 mg | SUBCUTANEOUS | 0 refills | Status: DC
  Filled 2022-09-02: qty 3, fill #0

## 2022-09-02 MED ORDER — SEMAGLUTIDE-WEIGHT MANAGEMENT 0.25 MG/0.5ML ~~LOC~~ SOAJ
0.2500 mg | SUBCUTANEOUS | 0 refills | Status: AC
  Filled 2022-09-02 – 2022-10-07 (×3): qty 2, 28d supply, fill #0

## 2022-09-02 MED ORDER — SEMAGLUTIDE-WEIGHT MANAGEMENT 1 MG/0.5ML ~~LOC~~ SOAJ
1.0000 mg | SUBCUTANEOUS | 0 refills | Status: AC
  Filled 2022-09-02: qty 2, 28d supply, fill #0

## 2022-09-02 MED ORDER — SEMAGLUTIDE-WEIGHT MANAGEMENT 1.7 MG/0.75ML ~~LOC~~ SOAJ
1.7000 mg | SUBCUTANEOUS | 0 refills | Status: AC
  Filled 2022-09-02: qty 3, 28d supply, fill #0

## 2022-09-02 NOTE — Addendum Note (Signed)
Addended by: Flossie Dibble on: 09/02/2022 10:05 AM   Modules accepted: Orders

## 2022-09-13 ENCOUNTER — Encounter (INDEPENDENT_AMBULATORY_CARE_PROVIDER_SITE_OTHER): Payer: Self-pay | Admitting: Family Medicine

## 2022-09-13 ENCOUNTER — Ambulatory Visit (INDEPENDENT_AMBULATORY_CARE_PROVIDER_SITE_OTHER): Payer: Commercial Managed Care - PPO | Admitting: Family Medicine

## 2022-09-13 ENCOUNTER — Other Ambulatory Visit (HOSPITAL_COMMUNITY): Payer: Self-pay

## 2022-09-13 VITALS — BP 137/84 | HR 71 | Temp 97.7°F | Ht 71.0 in | Wt 277.0 lb

## 2022-09-13 DIAGNOSIS — Z6838 Body mass index (BMI) 38.0-38.9, adult: Secondary | ICD-10-CM

## 2022-09-13 DIAGNOSIS — E7849 Other hyperlipidemia: Secondary | ICD-10-CM | POA: Diagnosis not present

## 2022-09-13 DIAGNOSIS — E559 Vitamin D deficiency, unspecified: Secondary | ICD-10-CM

## 2022-09-13 DIAGNOSIS — R7303 Prediabetes: Secondary | ICD-10-CM

## 2022-09-13 DIAGNOSIS — I10 Essential (primary) hypertension: Secondary | ICD-10-CM | POA: Diagnosis not present

## 2022-09-13 MED ORDER — VITAMIN D (ERGOCALCIFEROL) 1.25 MG (50000 UNIT) PO CAPS: 50000.0000 [IU] | ORAL_CAPSULE | ORAL | 0 refills | Status: DC

## 2022-09-13 NOTE — Progress Notes (Signed)
[name], D.O.  ABFM, ABOM Specializing in Clinical Bariatric Medicine  Office located at: [address] [place], [place]  [address]     Assessment and Plan:  Review labs with pt next OV.  Orders Placed This Encounter  Procedures   Comprehensive metabolic panel   Hemoglobin A1c   Insulin, random   Lipid Panel With LDL/HDL Ratio   VITAMIN D 25 Hydroxy (Vit-D Deficiency, Fractures)   Medications Discontinued During This Encounter  Medication Reason   Vitamin D, Ergocalciferol, (DRISDOL) 1.25 MG (50000 UNIT) CAPS capsule Reorder    Meds ordered this encounter  Medications   Vitamin D, Ergocalciferol, (DRISDOL) 1.25 MG (50000 UNIT) CAPS capsule    Sig: Take 1 capsule by mouth every 7 days.    Dispense:  4 capsule    Refill:  0    Vitamin D deficiency Assessment: Condition is Controlled. Pt vitamin D levels are within optimal range. This is being well controlled with ERGO. He tolerates this well and denies any adverse side effects at this time. However, he informed me that since missing his last OV he could not have his vitamin D refilled and spaced out his month prescription within the last 2 months.  Lab Results  Component Value Date   VD25OH 76.4 02/09/2022   VD25OH 24.4 (L) 05/21/2021   Plan: - Continue with Ergocalciferol 50K IU weekly. I will refill today.   - Weight loss will likely improve availability of vitamin D, thus encouraged Ociel to continue with meal plan and their weight loss efforts to further improve this condition.  Thus, we will need to monitor levels regularly every 3-4 months on average to keep levels within normal limits and prevent over supplementation.   Prediabetes Assessment: Condition is Not at goal.. This is diet/exercise controlled. He endorses not changing his eating habits while on the meal plan and still having hunger and cravings. He was approved for [facility] by his cardiologist but had a pharmacy error and has not gone to pick it  up since. He notes not wanting to pick it up and take it if the cost is too much.  Lab Results  Component Value Date   HGBA1C 5.6 02/09/2022   HGBA1C 5.7 (H) 07/13/2021   HGBA1C 5.8 (H) 07/09/2020   INSULIN 20.5 02/09/2022   INSULIN 22.2 05/21/2021    Plan: -  Pick up and start [facility] prescription.   - Ocie will continue to work on weight loss, exercise, via their meal plan we devised to help decrease the risk of progressing to diabetes.   - Continue to decrease simple carbs/ sugars; increase fiber and proteins -> follow his meal plan.  I reminded the pt that increasing lean protein will increase his metabolism.   - Anticipatory guidance given.    - We will recheck A1c and fasting insulin level in approximately 3 months from last check, or as deemed appropriate.    Essential hypertension Assessment: Condition is Controlled. His last 2 BP readings were normal. He continues Norvasc without difficulty and denies any adverse side effects.  Last 3 blood pressure readings in our office are as follows: BP Readings from Last 3 Encounters:  09/13/22 137/84  07/26/22 132/82  06/28/22 111/61   Plan: - Continue Norvasc 5mg  as directed.   Lia Hopping Knopf BP is 137/84 at goal today.   - Lifestyle changes such as following our low salt, heart healthy meal plan and engaging in a regular exercise program discussed   -  Ambulatory blood pressure monitoring encouraged.   - We will continue to monitor closely alongside PCP/ specialists.  Pt reminded to also f/up with those individuals as instructed by them. We will continue to monitor symptoms as they relate to the his weight loss journey.   Other hyperlipidemia Assessment: Condition is Not at goal. Pt has  been compliant and endorses taking Lipitor consistently for several months now. He tolerates this well and denies any adverse side effects. Lab Results  Component Value Date   CHOL 88 (L) 02/09/2022   HDL 41 02/09/2022   LDLCALC 28  02/09/2022   TRIG 101 02/09/2022   CHOLHDL 2.1 02/09/2022   Plan: - Continue Lipitor 80mg  daily as directed.   [name] agrees to continue with meds and/or our treatment plan of a heart-heathy, low cholesterol meal plan  - We extensively discussed several lifestyle modifications today and he will continue to work on diet, exercise and weight loss efforts.   - I stressed the importance that patient continue with our prudent nutritional plan that is low in saturated and trans fats, and low in fatty carbs to improve these numbers.   - We will continue routine screening as patient continues to achieve health goals along their weight loss journey    TREATMENT PLAN FOR OBESITY: BMI 36.0-36.9,adult- current bmi 36.84 Morbid obesity (HCC)-start bmi 39.33/date 05/21/21 Assessment:  THEOFANIS JIM is here to discuss his progress with his obesity treatment plan along with follow-up of his obesity related diagnoses. See Medical Weight Management Flowsheet for complete bioelectrical impedance results.  Condition is not optimized. Biometric data collected today, was reviewed with patient.   Since last office visit on 07/26/2022 patient's  Muscle mass has increased by 4.4lb. Fat mass has increased by 8lb. Total body water has increased by 8.2lb.  Counseling done on how various foods will affect these numbers and how to maximize success  Total lbs lost to date: 5 Total weight loss percentage to date: 1.77%   Plan: - Deqwan will work on healthier eating habits and try to Continue the category 4 nutritional plan and adhering to the goals of 1800-1900 calories and 120+ protein   best they can.   - I recommended fairlife protein shakes and laughing cow wedges to help increase his protein intake.   - I recommended palmini pasta, zero pasta, and   Behavioral Intervention Additional resources provided today: food log Evidence-based interventions for health behavior change were utilized today  including the discussion of self monitoring techniques, problem-solving barriers and SMART goal setting techniques.   Regarding patient's less desirable eating habits and patterns, we employed the technique of small changes.  Pt will specifically work on: consistently following the meal plan for next visit.     He has agreed to Continue current level of physical activity    FOLLOW UP: Return in about 4 weeks (around 10/11/2022).  He was informed of the importance of frequent follow up visits to maximize his success with intensive lifestyle modifications for his multiple health conditions. [name] is aware that we will review all of his lab results at our next visit together in person.  He is aware that if anything is critical/ life threatening with the results, we will be contacting him via MyChart or by my CMA will be calling them prior to the office visit to discuss acute management.    Subjective:   Chief complaint: Obesity Kadmiel is here to discuss his progress with his obesity treatment  plan. He is on the the Category 4 Plan and keeping a food journal and adhering to recommended goals of 1800-1900 calories and 120+ protein and states he is following his eating plan approximately 50% of the time. He states he is going to the gym 30 minutes 3 days per week.  Interval History:  WENDAL BICKSLER is here for a follow up office visit.     Since last office visit he has been well. He notes not changing his eating pattern for the past 2 months although he did write down everything he ate majority of the week. He kept majority of his food intake on his online app. He informed me that he cooks his meals with protein but add extra things off the meal plan to accomodates his family's meals such as pasta. He notes eating 3-4oz during lunch and dinner. He had about 938 calories with 57g of protein, 2140 calories with 48g of protein, 1888 calories with 89g of protein. He is usually below his protein  intake goals. He informed me that he dislikes greek yogurt but does like cottage cheese. When he typically eats cottage cheese he eats it with spinach and egg noodles.   We reviewed his meal plan and all questions were answered.    Review of Systems:  Pertinent positives were addressed with patient today.  Reviewed by clinician on day of visit: allergies, medications, problem list, medical history, surgical history, family history, social history, and previous encounter notes.  Weight Summary and Biometrics   Weight Lost Since Last Visit: 0lb  Weight Gained Since Last Visit: 13lb   Vitals Temp: 97.7 F (36.5 C) BP: 137/84 Pulse Rate: 71 SpO2: 95 %   Anthropometric Measurements Height: 5\' 11"  (1.803 m) Weight: 277 lb (125.6 kg) BMI (Calculated): 38.65 Weight at Last Visit: 264lb Weight Lost Since Last Visit: 0lb Weight Gained Since Last Visit: 13lb Starting Weight: 282lb Total Weight Loss (lbs): 5 lb (2.268 kg) Peak Weight: 290lb   Body Composition  Body Fat %: 35.9 % Fat Mass (lbs): 99.4 lbs Muscle Mass (lbs): 169 lbs Total Body Water (lbs): 125 lbs Visceral Fat Rating : 23   Other Clinical Data Fasting: yes Labs: yes Today's Visit #: 17 Starting Date: 05/21/21     Objective:   PHYSICAL EXAM: Blood pressure 137/84, pulse 71, temperature 97.7 F (36.5 C), height 5\' 11"  (1.803 m), weight 277 lb (125.6 kg), SpO2 95%. Body mass index is 38.63 kg/m.  General: Well Developed, well nourished, and in no acute distress.  HEENT: Normocephalic, atraumatic Skin: Warm and dry, cap RF less 2 sec, good turgor Chest:  Normal excursion, shape, no gross abn Respiratory: speaking in full sentences, no conversational dyspnea NeuroM-Sk: Ambulates w/o assistance, moves * 4 Psych: A and O *3, insight good, mood-full  DIAGNOSTIC DATA REVIEWED:  BMET    Component Value Date/Time   NA 139 06/25/2022 1006   K 4.2 06/25/2022 1006   CL 104 06/25/2022 1006   CO2 23  06/25/2022 1006   GLUCOSE 143 (H) 06/25/2022 1006   GLUCOSE 107 (H) 10/17/2020 1200   BUN 10 06/25/2022 1006   CREATININE 0.98 06/25/2022 1006   CALCIUM 9.5 06/25/2022 1006   GFRNONAA >60 10/17/2020 1200   GFRAA >60 01/08/2019 1145   Lab Results  Component Value Date   HGBA1C 5.6 02/09/2022   HGBA1C 5.6 10/07/2012   Lab Results  Component Value Date   INSULIN 20.5 02/09/2022   INSULIN 22.2 05/21/2021   Lab  Results  Component Value Date   TSH 2.600 07/13/2021   CBC    Component Value Date/Time   WBC 7.9 06/25/2022 1006   WBC 6.5 10/17/2020 1200   RBC 5.27 06/25/2022 1006   RBC 5.06 10/17/2020 1200   HGB 15.5 06/25/2022 1006   HCT 47.7 06/25/2022 1006   PLT 186 06/25/2022 1006   MCV 91 06/25/2022 1006   MCH 29.4 06/25/2022 1006   MCH 30.2 10/17/2020 1200   MCHC 32.5 06/25/2022 1006   MCHC 32.7 10/17/2020 1200   RDW 12.8 06/25/2022 1006   Iron Studies No results found for: "IRON", "TIBC", "FERRITIN", "IRONPCTSAT" Lipid Panel     Component Value Date/Time   CHOL 88 (L) 02/09/2022 1049   CHOL CANCELED 11/07/2014 0921   CHOL SEE BELOW 11/07/2014 0921   CHOL 128 04/26/2014 0757   TRIG 101 02/09/2022 1049   TRIG CANCELED 11/07/2014 0921   TRIG SEE BELOW 11/07/2014 0921   TRIG 140 04/26/2014 0757   HDL 41 02/09/2022 1049   HDL CANCELED 11/07/2014 0921   HDL SEE BELOW 11/07/2014 0921   HDL 39 (L) 04/26/2014 0757   CHOLHDL 2.1 02/09/2022 1049   CHOLHDL 2.6 04/18/2015 0930   VLDL 22 04/18/2015 0930   LDLCALC 28 02/09/2022 1049   LDLCALC CANCELED 11/07/2014 0921   LDLCALC SEE BELOW 11/07/2014 0921   LDLCALC 61 04/26/2014 0757   Hepatic Function Panel     Component Value Date/Time   PROT 7.2 02/09/2022 1049   ALBUMIN 4.6 02/09/2022 1049   AST 26 02/09/2022 1049   ALT 36 02/09/2022 1049   ALKPHOS 112 02/09/2022 1049   BILITOT 0.8 02/09/2022 1049   BILIDIR 0.19 02/21/2017 0934   IBILI 0.5 11/07/2014 0921      Component Value Date/Time   TSH 2.600  07/13/2021 0938   Nutritional Lab Results  Component Value Date   VD25OH 76.4 02/09/2022   VD25OH 24.4 (L) 05/21/2021    Attestations:   This encounter took 45 total minutes of time including any pre-visit and post-visit time spent on this date of service, including taking a thorough history, reviewing any labs and/or imaging, reviewing prior notes, as well as documenting in the electronic health record on the date of service. Over 50% of that time was in direct face-to-face counseling and coordinating care for the patient today  I, Clinical biochemist, acting as a Stage manager for [employer], DO., have compiled all relevant documentation for today's office visit on behalf of Thomasene Lot, DO, while in the presence of [employer], DO.  I have reviewed the above documentation for accuracy and completeness, and I agree with the above. [name], D.O.  The 21st Century Cures Act was signed into law in 2016 which includes the topic of electronic health records.  This provides immediate access to information in MyChart.  This includes consultation notes, operative notes, office notes, lab results and pathology reports.  If you have any questions about what you read please let us know at your next visit so we can discuss your concerns and take corrective action if need be.  We are right here with you.

## 2022-09-14 LAB — COMPREHENSIVE METABOLIC PANEL
ALT: 35 IU/L (ref 0–44)
AST: 26 IU/L (ref 0–40)
Albumin: 4.5 g/dL (ref 3.9–4.9)
Alkaline Phosphatase: 106 IU/L (ref 44–121)
BUN/Creatinine Ratio: 12 (ref 10–24)
BUN: 12 mg/dL (ref 8–27)
Bilirubin Total: 0.8 mg/dL (ref 0.0–1.2)
CO2: 21 mmol/L (ref 20–29)
Calcium: 9.8 mg/dL (ref 8.6–10.2)
Chloride: 105 mmol/L (ref 96–106)
Creatinine, Ser: 1.03 mg/dL (ref 0.76–1.27)
Globulin, Total: 2.5 g/dL (ref 1.5–4.5)
Glucose: 99 mg/dL (ref 70–99)
Potassium: 4.2 mmol/L (ref 3.5–5.2)
Sodium: 143 mmol/L (ref 134–144)
Total Protein: 7 g/dL (ref 6.0–8.5)
eGFR: 82 mL/min/{1.73_m2} (ref >59)

## 2022-09-14 LAB — LIPID PANEL WITH LDL/HDL RATIO
Cholesterol, Total: 115 mg/dL (ref 100–199)
HDL: 41 mg/dL (ref >39)
LDL Chol Calc (NIH): 41 mg/dL (ref 0–99)
LDL/HDL Ratio: 1 ratio (ref 0.0–3.6)
Triglycerides: 210 mg/dL — ABNORMAL HIGH (ref 0–149)
VLDL Cholesterol Cal: 33 mg/dL (ref 5–40)

## 2022-09-14 LAB — HEMOGLOBIN A1C
Est. average glucose Bld gHb Est-mCnc: 111 mg/dL
Hgb A1c MFr Bld: 5.5 % (ref 4.8–5.6)

## 2022-09-14 LAB — VITAMIN D 25 HYDROXY (VIT D DEFICIENCY, FRACTURES): Vit D, 25-Hydroxy: 40.3 ng/mL (ref 30.0–100.0)

## 2022-09-14 LAB — INSULIN, RANDOM: INSULIN: 23.2 u[IU]/mL (ref 2.6–24.9)

## 2022-09-17 DIAGNOSIS — G4733 Obstructive sleep apnea (adult) (pediatric): Secondary | ICD-10-CM | POA: Diagnosis not present

## 2022-10-03 ENCOUNTER — Other Ambulatory Visit (INDEPENDENT_AMBULATORY_CARE_PROVIDER_SITE_OTHER): Payer: Self-pay | Admitting: Family Medicine

## 2022-10-03 DIAGNOSIS — E559 Vitamin D deficiency, unspecified: Secondary | ICD-10-CM

## 2022-10-07 ENCOUNTER — Ambulatory Visit (INDEPENDENT_AMBULATORY_CARE_PROVIDER_SITE_OTHER): Payer: Commercial Managed Care - PPO | Admitting: Family Medicine

## 2022-10-07 ENCOUNTER — Other Ambulatory Visit (HOSPITAL_COMMUNITY): Payer: Self-pay

## 2022-10-07 ENCOUNTER — Encounter (INDEPENDENT_AMBULATORY_CARE_PROVIDER_SITE_OTHER): Payer: Self-pay | Admitting: Family Medicine

## 2022-10-07 VITALS — BP 133/84 | HR 74 | Temp 97.6°F | Ht 71.0 in | Wt 279.0 lb

## 2022-10-07 DIAGNOSIS — E559 Vitamin D deficiency, unspecified: Secondary | ICD-10-CM

## 2022-10-07 DIAGNOSIS — Z6838 Body mass index (BMI) 38.0-38.9, adult: Secondary | ICD-10-CM

## 2022-10-07 DIAGNOSIS — R7303 Prediabetes: Secondary | ICD-10-CM | POA: Diagnosis not present

## 2022-10-07 DIAGNOSIS — E65 Localized adiposity: Secondary | ICD-10-CM | POA: Diagnosis not present

## 2022-10-07 DIAGNOSIS — E7849 Other hyperlipidemia: Secondary | ICD-10-CM

## 2022-10-07 MED ORDER — VITAMIN D3 25 MCG (1000 UT) PO CAPS
ORAL_CAPSULE | ORAL | Status: DC
Start: 2022-10-07 — End: 2023-06-29

## 2022-10-07 MED ORDER — VITAMIN D (ERGOCALCIFEROL) 1.25 MG (50000 UNIT) PO CAPS
50000.0000 [IU] | ORAL_CAPSULE | ORAL | 0 refills | Status: DC
Start: 2022-10-07 — End: 2022-11-01
  Filled 2022-10-07: qty 4, 28d supply, fill #0

## 2022-10-07 NOTE — Progress Notes (Signed)
Wayne Jordan, D.O.  ABFM, ABOM Specializing in Clinical Bariatric Medicine  Office located at: [address] [place], [place]  [address]     Assessment and Plan:   Medications Discontinued During This Encounter  Medication Reason   Vitamin D, Ergocalciferol, (DRISDOL) 1.25 MG (50000 UNIT) CAPS capsule Reorder     Meds ordered this encounter  Medications   Vitamin D, Ergocalciferol, (DRISDOL) 1.25 MG (50000 UNIT) CAPS capsule    Sig: Take 1 capsule by mouth every 7 days.    Dispense:  4 capsule    Refill:  0   Cholecalciferol (VITAMIN D3) 25 MCG (1000 UT) CAPS    Sig: 5,000 international unit otc qd     Prediabetes Assessment & Plan: Lab Results  Component Value Date   HGBA1C 5.5 09/13/2022   HGBA1C 5.6 02/09/2022   HGBA1C 5.7 (H) 07/13/2021   INSULIN 23.2 09/13/2022   INSULIN 20.5 02/09/2022   INSULIN 22.2 05/21/2021   Lab Results  Component Value Date   CREATININE 1.03 09/13/2022   BUN 12 09/13/2022   NA 143 09/13/2022   K 4.2 09/13/2022   CL 105 09/13/2022   CO2 21 09/13/2022      Component Value Date/Time   PROT 7.0 09/13/2022 0959   ALBUMIN 4.5 09/13/2022 0959   AST 26 09/13/2022 0959   ALT 35 09/13/2022 0959   ALKPHOS 106 09/13/2022 0959   BILITOT 0.8 09/13/2022 0959   BILIDIR 0.19 02/21/2017 0934   IBILI 0.5 11/07/2014 0921    No current meds. Diet/exercise approach. A1c has slightly improved from 5.6 to 5.5. Fasting insulin has increased from 20.5 to 23.2. Kidney function, electrolytes, and liver enzymes are within recommended normal limits.   Drink at least half of your weight in ounces of water per day unless otherwise noted by one of your doctors that you must restrict water intake.   Reminded patient that having protein with each meal is important for stabilizing sugars, controlling hunger and cravings, & increasing metabolism. Continue to decrease simple carbs/ sugars; increase fiber and proteins -> follow his meal plan.     Other  hyperlipidemia Assessment & Plan: Lab Results  Component Value Date   CHOL 115 09/13/2022   HDL 41 09/13/2022   LDLCALC 41 09/13/2022   TRIG 210 (H) 09/13/2022   CHOLHDL 2.1 02/09/2022   Hyperlipidemia treated with Lipitor 80 mg daily, tolerating well. TRIG have increased from 101 to 210 and total cholesterol has increased from 88 to 115. HDL & LDL are stable.    I stressed the importance that patient continue with our prudent nutritional plan that is low in saturated and trans fats, and low in fatty carbs to improve these numbers. We recommend: aerobic activity with eventual goal of a minimum of 150+ min wk plus 2 days/ week of resistance or strength training.  C/w Lipitor per Dr.Krasowski.    Vitamin D deficiency Assessment & Plan: Lab Results  Component Value Date   VD25OH 40.3 09/13/2022   VD25OH 76.4 02/09/2022   VD25OH 24.4 (L) 05/21/2021   Pt reports that he has been consistently taking ERGO 50,000 once a wk.  Vitamin D levels have decreased from 76.4 to 40.3 and are not within the recommended limits of 50 to 70-80. Pt has been been instructed to continue with ERGO at current dose and also begin OTC Vitamin D 5,000 international units daily. Will recheck levels in 2-3 mos.    Visceral obesity Assessment & Plan: Current  visceral fat rating: 23.  The visceral fat rating should be  < 10 in a male.  Visceral adipose tissue is a hormonally active component of total body fat. This body composition phenotype is associated with medical disorders such as metabolic syndrome, cardiovascular disease and several malignancies including prostate, breast, and colorectal cancers. Goal: Lose 7-10% of weight via prudent nutritional plan and lifestyle changes.     BMI 38.0-38.9,adult- current bmi 38.93 Morbid obesity (HCC)-start bmi 39.33 Assessment & Plan: Since last office visit on 09/13/22 patient's  muscle mass has increased by 2.4 lb. Fat mass has decreased by 0.6 lb. Total body water has  decreased by 1.4 lb.  Counseling done on how various foods will affect these numbers and how to maximize success  Total lbs lost to date: 3 lbs  Total weight loss percentage to date: 1.06%   No change to meal plan - see Subjective   Reviewed the Eating Out Guide with pt: recommended him to try the Egg white grill with extra chicken breast from [employer] as a better alternative for breakfast.   Discussed the importance of "figuring out his why" for weight loss.    Behavioral Intervention Additional resources provided today:  Eating Out Guide  Evidence-based interventions for health behavior change were utilized today including the discussion of self monitoring techniques, problem-solving barriers and SMART goal setting techniques.   Regarding patient's less desirable eating habits and patterns, we employed the technique of small changes.  Pt will specifically work on: cutting out SSBs & finding healthier alternative for breakfasts for next visit.    FOLLOW UP: Return in about 25 days (around 11/01/2022). He was informed of the importance of frequent follow up visits to maximize his success with intensive lifestyle modifications for his multiple health conditions.  Subjective:   Chief complaint: Obesity Wayne Jordan is here to discuss his progress with his obesity treatment plan. He is on the Category 4 Plan and keeping a food journal and adhering to recommended goals of 1800-1900 calories and 120+ protein and states he is following his eating plan approximately 50% of the time. He states he is going to the gym 30-40 minutes 3 days per week.  Interval History:  Wayne Jordan is here for a follow up office visit. Since last OV, Wayne Jordan has been doing okay. Reports not being being very happy with the current state of his weight loss journey. He averages 1,900-2,100 calories & 80 grams of protein daily. Food recall: for breakfast, he typically has two fried eggs with 1 slice of cheese or a sandwich  from [employer] (800 cal, 4 grams protein). For lunch, he ordinarily has a sandwich with 45 cal Terrill Mohr bread & 1 tbsp of regular peanut butter or "leftovers" ( chicken, etc). At night, he has 4-6 ounces of chicken. Reports also having a cheer wine & almond biscuits in the pm. Drinks roughly 3-4 bottles of water daily. GLP-1 medication wise,  Dr.Krasowski wrote for [facility] due to Wayne Jordan's cardiovascular indications. Unfortunately, medication was not covered by insurance.  Review of Systems:  Pertinent positives were addressed with patient today.  Reviewed by clinician on day of visit: allergies, medications, problem list, medical history, surgical history, family history, social history, and previous encounter notes.  Weight Summary and Biometrics   Weight Lost Since Last Visit: 0lb  Weight Gained Since Last Visit: 2lb   Vitals Temp: 97.6 F (36.4 C) BP: 133/84 Pulse Rate: 74 SpO2: 93 %   Anthropometric Measurements Height: 5'  11" (1.803 m) Weight: 279 lb (126.6 kg) BMI (Calculated): 38.93 Weight at Last Visit: 277lb Weight Lost Since Last Visit: 0lb Weight Gained Since Last Visit: 2lb Starting Weight: 282lb Total Weight Loss (lbs): 3 lb (1.361 kg) Peak Weight: 290lb   Body Composition  Body Fat %: 35.4 % Fat Mass (lbs): 98.8 lbs Muscle Mass (lbs): 171.4 lbs Total Body Water (lbs): 123.6 lbs Visceral Fat Rating : 23   Other Clinical Data Fasting: no Labs: no Today's Visit #: 18 Starting Date: 05/21/21   Objective:   PHYSICAL EXAM: Blood pressure 133/84, pulse 74, temperature 97.6 F (36.4 C), height 5\' 11"  (1.803 m), weight 279 lb (126.6 kg), SpO2 93%. Body mass index is 38.91 kg/m.  General: Well Developed, well nourished, and in no acute distress.  HEENT: Normocephalic, atraumatic Skin: Warm and dry, cap RF less 2 sec, good turgor Chest:  Normal excursion, shape, no gross abn Respiratory: speaking in full sentences, no conversational dyspnea NeuroM-Sk:  Ambulates w/o assistance, moves * 4 Psych: A and O *3, insight good, mood-full  DIAGNOSTIC DATA REVIEWED:  BMET    Component Value Date/Time   NA 143 09/13/2022 0959   K 4.2 09/13/2022 0959   CL 105 09/13/2022 0959   CO2 21 09/13/2022 0959   GLUCOSE 99 09/13/2022 0959   GLUCOSE 107 (H) 10/17/2020 1200   BUN 12 09/13/2022 0959   CREATININE 1.03 09/13/2022 0959   CALCIUM 9.8 09/13/2022 0959   GFRNONAA >60 10/17/2020 1200   GFRAA >60 01/08/2019 1145   Lab Results  Component Value Date   HGBA1C 5.5 09/13/2022   HGBA1C 5.6 10/07/2012   Lab Results  Component Value Date   INSULIN 23.2 09/13/2022   INSULIN 22.2 05/21/2021   Lab Results  Component Value Date   TSH 2.600 07/13/2021   CBC    Component Value Date/Time   WBC 7.9 06/25/2022 1006   WBC 6.5 10/17/2020 1200   RBC 5.27 06/25/2022 1006   RBC 5.06 10/17/2020 1200   HGB 15.5 06/25/2022 1006   HCT 47.7 06/25/2022 1006   PLT 186 06/25/2022 1006   MCV 91 06/25/2022 1006   MCH 29.4 06/25/2022 1006   MCH 30.2 10/17/2020 1200   MCHC 32.5 06/25/2022 1006   MCHC 32.7 10/17/2020 1200   RDW 12.8 06/25/2022 1006   Iron Studies No results found for: "IRON", "TIBC", "FERRITIN", "IRONPCTSAT" Lipid Panel     Component Value Date/Time   CHOL 115 09/13/2022 0959   CHOL CANCELED 11/07/2014 0921   CHOL SEE BELOW 11/07/2014 0921   CHOL 128 04/26/2014 0757   TRIG 210 (H) 09/13/2022 0959   TRIG CANCELED 11/07/2014 0921   TRIG SEE BELOW 11/07/2014 0921   TRIG 140 04/26/2014 0757   HDL 41 09/13/2022 0959   HDL CANCELED 11/07/2014 0921   HDL SEE BELOW 11/07/2014 0921   HDL 39 (L) 04/26/2014 0757   CHOLHDL 2.1 02/09/2022 1049   CHOLHDL 2.6 04/18/2015 0930   VLDL 22 04/18/2015 0930   LDLCALC 41 09/13/2022 0959   LDLCALC CANCELED 11/07/2014 0921   LDLCALC SEE BELOW 11/07/2014 0921   LDLCALC 61 04/26/2014 0757   Hepatic Function Panel     Component Value Date/Time   PROT 7.0 09/13/2022 0959   ALBUMIN 4.5 09/13/2022  0959   AST 26 09/13/2022 0959   ALT 35 09/13/2022 0959   ALKPHOS 106 09/13/2022 0959   BILITOT 0.8 09/13/2022 0959   BILIDIR 0.19 02/21/2017 0934   IBILI 0.5 11/07/2014 2956  Component Value Date/Time   TSH 2.600 07/13/2021 0938   Nutritional Lab Results  Component Value Date   VD25OH 40.3 09/13/2022   VD25OH 76.4 02/09/2022   VD25OH 24.4 (L) 05/21/2021    Attestations:   Patient was in the office today and time spent on visit including pre-visit chart review and post-visit care/coordination of care and electronic medical record documentation was 40 minutes. 50% of the time was in face to face counseling of this patient's medical condition(s) and providing education on treatment options to include the first-line treatment of diet and lifestyle modification.   I, Wayne Jordan, acting as a Stage manager for [employer], DO., have compiled all relevant documentation for today's office visit on behalf of Wayne Lot, DO, while in the presence of [employer], DO.  I have reviewed the above documentation for accuracy and completeness, and I agree with the above. Wayne Jordan, D.O.  The 21st Century Cures Act was signed into law in 2016 which includes the topic of electronic health records.  This provides immediate access to information in MyChart.  This includes consultation notes, operative notes, office notes, lab results and pathology reports.  If you have any questions about what you read please let us know at your next visit so we can discuss your concerns and take corrective action if need be.  We are right here with you.

## 2022-10-11 ENCOUNTER — Other Ambulatory Visit (HOSPITAL_COMMUNITY): Payer: Self-pay

## 2022-10-11 ENCOUNTER — Telehealth: Payer: Self-pay

## 2022-10-11 ENCOUNTER — Ambulatory Visit (INDEPENDENT_AMBULATORY_CARE_PROVIDER_SITE_OTHER): Payer: Commercial Managed Care - PPO | Admitting: Family Medicine

## 2022-10-11 NOTE — Telephone Encounter (Signed)
Pharmacy Patient Advocate Encounter  Received notification from [facility] that Prior Authorization for [facility] has been DENIED.  Full denial letter will be uploaded to the media tab. See denial reason below.  PLAN BENEFIT EXCLUSION

## 2022-10-11 NOTE — Telephone Encounter (Signed)
Pharmacy Patient Advocate Encounter   Received notification from CoverMyMeds that prior authorization for [facility] is required/requested.   Insurance verification completed.   The patient is insured through [facility] .   Per test claim: PA required; PA submitted to [facility] via CoverMyMeds Key/confirmation #/EOC [id number] Status is pending

## 2022-10-12 ENCOUNTER — Telehealth (INDEPENDENT_AMBULATORY_CARE_PROVIDER_SITE_OTHER): Payer: Self-pay | Admitting: Family Medicine

## 2022-10-12 NOTE — Telephone Encounter (Signed)
Mary Zapior - Case Manager with [facility] - Called stating that if we need anything for our patient, please contact her at [phone]

## 2022-10-18 DIAGNOSIS — G4733 Obstructive sleep apnea (adult) (pediatric): Secondary | ICD-10-CM | POA: Diagnosis not present

## 2022-10-29 ENCOUNTER — Other Ambulatory Visit: Payer: Self-pay

## 2022-11-01 ENCOUNTER — Other Ambulatory Visit: Payer: Self-pay

## 2022-11-01 ENCOUNTER — Other Ambulatory Visit (HOSPITAL_COMMUNITY): Payer: Self-pay

## 2022-11-01 ENCOUNTER — Encounter (INDEPENDENT_AMBULATORY_CARE_PROVIDER_SITE_OTHER): Payer: Self-pay | Admitting: Family Medicine

## 2022-11-01 ENCOUNTER — Ambulatory Visit (INDEPENDENT_AMBULATORY_CARE_PROVIDER_SITE_OTHER): Payer: Commercial Managed Care - PPO | Admitting: Family Medicine

## 2022-11-01 VITALS — BP 135/76 | HR 73 | Temp 97.7°F | Ht 71.0 in | Wt 268.0 lb

## 2022-11-01 DIAGNOSIS — E559 Vitamin D deficiency, unspecified: Secondary | ICD-10-CM | POA: Diagnosis not present

## 2022-11-01 DIAGNOSIS — Z6838 Body mass index (BMI) 38.0-38.9, adult: Secondary | ICD-10-CM

## 2022-11-01 DIAGNOSIS — R7303 Prediabetes: Secondary | ICD-10-CM

## 2022-11-01 DIAGNOSIS — Z6837 Body mass index (BMI) 37.0-37.9, adult: Secondary | ICD-10-CM

## 2022-11-01 MED ORDER — VITAMIN D (ERGOCALCIFEROL) 1.25 MG (50000 UNIT) PO CAPS
50000.0000 [IU] | ORAL_CAPSULE | ORAL | 0 refills | Status: DC
Start: 2022-11-01 — End: 2022-11-29
  Filled 2022-11-01 (×3): qty 4, 28d supply, fill #0

## 2022-11-01 NOTE — Progress Notes (Signed)
Wayne Jordan, D.O.  ABFM, ABOM Specializing in Clinical Bariatric Medicine  Office located at: [address] [name], [place]  [address]     Assessment and Plan:   Medications Discontinued During This Encounter  Medication Reason   Multiple Vitamin (MULTI-VITAMIN DAILY PO)    Vitamin D, Ergocalciferol, (DRISDOL) 1.25 MG (50000 UNIT) CAPS capsule Reorder    Meds ordered this encounter  Medications   Vitamin D, Ergocalciferol, (DRISDOL) 1.25 MG (50000 UNIT) CAPS capsule    Sig: Take 1 capsule by mouth every 7 days.    Dispense:  4 capsule    Refill:  0   Vitamin D deficiency Assessment: Condition is Not at goal.. Pt vitamin D levels are well below the recommended range. He continues with ERGO once a week and has started the OTC vitamin D supplement.  Lab Results  Component Value Date   VD25OH 40.3 09/13/2022   VD25OH 76.4 02/09/2022   VD25OH 24.4 (L) 05/21/2021   Plan: - Continue with Ergocalciferol 50K IU once weekly and OTC cholecalciferol once daily.  I will refill today.   - We will continue to monitor levels regularly (every 3-4 mo on average) to keep levels within normal limits and prevent over supplementation.  - pt's questions and concerns regarding this condition addressed.    Prediabetes Assessment: Condition is Not at goal.. Pt hasn't started Semaglutide as it isn't covered bu his insurance. This was previously prescribed by his PCP. Pt endorses being hungry late at night, especially when his son gets home from work late. He states that his stomach and bowels are more under control when following the prescribed meal plan.  Lab Results  Component Value Date   HGBA1C 5.5 09/13/2022   HGBA1C 5.6 02/09/2022   HGBA1C 5.7 (H) 07/13/2021   INSULIN 23.2 09/13/2022   INSULIN 20.5 02/09/2022   INSULIN 22.2 05/21/2021    Plan: - Wayne Jordan will continue to work on weight loss, exercise, via their meal plan we devised to help decrease the risk of progressing to  diabetes.   - Continue to decrease simple carbs/ sugars; increase fiber and proteins -> follow his meal plan. I Explained role of simple carbs and insulin levels on hunger and cravings  - Anticipatory guidance given.    - We will recheck A1c and fasting insulin level in approximately 3 months from last check, or as deemed appropriate.    TREATMENT PLAN FOR OBESITY: BMI 38.0-38.9,adult- current bmi 37.39 Morbid obesity (HCC)-start bmi 39.33 Assessment:  Wayne Jordan is here to discuss his progress with his obesity treatment plan along with follow-up of his obesity related diagnoses. See Medical Weight Management Flowsheet for complete bioelectrical impedance results.  Condition is improving but not optimized. Biometric data collected today, was reviewed with patient.   Since last office visit on 10/07/2022 patient's  Muscle mass has decreased by 4lb. Fat mass has decreased by 6lb. Total body water has decreased by 79.7lb.  Counseling done on how various foods will affect these numbers and how to maximize success  Total lbs lost to date: 14 Total weight loss percentage to date: 4.96%   Plan: - Lazlo will work on healthier eating habits and try to follow the Category 4 Plan and keeping a food journal and adhering to recommended goals of 1800-1900 calories and 140+ protein best they can.   - I informed him about [employer], [employer], and [employer].   Behavioral Intervention Additional resources provided today:  how  families can be supportive handout Evidence-based interventions for health behavior change were utilized today including the discussion of self monitoring techniques, problem-solving barriers and SMART goal setting techniques.   Regarding patient's less desirable eating habits and patterns, we employed the technique of small changes.  Pt will specifically work on: journal calorie and protein intake for next visit.    He has agreed to Continue current level of  physical activity    FOLLOW UP: Return in about 4 weeks (around 11/29/2022).  He was informed of the importance of frequent follow up visits to maximize his success with intensive lifestyle modifications for his multiple health conditions.  Subjective:   Chief complaint: Obesity Wayne Jordan is here to discuss his progress with his obesity treatment plan. He is on the Category 4 Plan and keeping a food journal and adhering to recommended goals of 1800-1900 calories and 120+ protein and states he is following his eating plan approximately 30% of the time. He states he is going to the gym 30-45 minutes 3 days per week.   Interval History:  Wayne Jordan is here for a follow up office visit.     Since last office visit:  He notes doing well since last OV. He informed me that he has been on a carnivore diet for the past 2 weeks. He will eat boiled eggs/hard fried eggs every other day. He didn't eat any bread, pasta, rice, potatoes, carb veggies, etc. He has tried to eat some tilapia and shrimp. He has noticed that dipping sauces have a lot of calories and doesn't drink any sugar filled drinks.  He notes not journaling his food intake but will start doing so soon.    We reviewed his meal plan and all questions were answered.   Review of Systems:  Pertinent positives were addressed with patient today.  Reviewed by clinician on day of visit: allergies, medications, problem list, medical history, surgical history, family history, social history, and previous encounter notes.  Weight Summary and Biometrics   Weight Lost Since Last Visit: 11lb  Weight Gained Since Last Visit: 0lb   Vitals Temp: 97.7 F (36.5 C) BP: 135/76 Pulse Rate: 73 SpO2: 99 %   Anthropometric Measurements Height: 5\' 11"  (1.803 m) Weight: 268 lb (121.6 kg) BMI (Calculated): 37.39 Weight at Last Visit: 279lb Weight Lost Since Last Visit: 11lb Weight Gained Since Last Visit: 0lb Starting Weight: 282lb Total Weight  Loss (lbs): 14 lb (6.35 kg) Peak Weight: 290lb   Body Composition  Body Fat %: 34.5 % Fat Mass (lbs): 92.8 lbs Muscle Mass (lbs): 167.4 lbs Total Body Water (lbs): 43.9 lbs Visceral Fat Rating : 22   Other Clinical Data Fasting: no Labs: no Today's Visit #: 19 Starting Date: 05/21/21     Objective:   PHYSICAL EXAM: Blood pressure 135/76, pulse 73, temperature 97.7 F (36.5 C), height 5\' 11"  (1.803 m), weight 268 lb (121.6 kg), SpO2 99%. Body mass index is 37.38 kg/m.  General: Well Developed, well nourished, and in no acute distress.  HEENT: Normocephalic, atraumatic Skin: Warm and dry, cap RF less 2 sec, good turgor Chest:  Normal excursion, shape, no gross abn Respiratory: speaking in full sentences, no conversational dyspnea NeuroM-Sk: Ambulates w/o assistance, moves * 4 Psych: A and O *3, insight good, mood-full  DIAGNOSTIC DATA REVIEWED:  BMET    Component Value Date/Time   NA 143 09/13/2022 0959   K 4.2 09/13/2022 0959   CL 105 09/13/2022 0959   CO2 21 09/13/2022  0959   GLUCOSE 99 09/13/2022 0959   GLUCOSE 107 (H) 10/17/2020 1200   BUN 12 09/13/2022 0959   CREATININE 1.03 09/13/2022 0959   CALCIUM 9.8 09/13/2022 0959   GFRNONAA >60 10/17/2020 1200   GFRAA >60 01/08/2019 1145   Lab Results  Component Value Date   HGBA1C 5.5 09/13/2022   HGBA1C 5.6 10/07/2012   Lab Results  Component Value Date   INSULIN 23.2 09/13/2022   INSULIN 22.2 05/21/2021   Lab Results  Component Value Date   TSH 2.600 07/13/2021   CBC    Component Value Date/Time   WBC 7.9 06/25/2022 1006   WBC 6.5 10/17/2020 1200   RBC 5.27 06/25/2022 1006   RBC 5.06 10/17/2020 1200   HGB 15.5 06/25/2022 1006   HCT 47.7 06/25/2022 1006   PLT 186 06/25/2022 1006   MCV 91 06/25/2022 1006   MCH 29.4 06/25/2022 1006   MCH 30.2 10/17/2020 1200   MCHC 32.5 06/25/2022 1006   MCHC 32.7 10/17/2020 1200   RDW 12.8 06/25/2022 1006   Iron Studies No results found for: "IRON",  "TIBC", "FERRITIN", "IRONPCTSAT" Lipid Panel     Component Value Date/Time   CHOL 115 09/13/2022 0959   CHOL CANCELED 11/07/2014 0921   CHOL SEE BELOW 11/07/2014 0921   CHOL 128 04/26/2014 0757   TRIG 210 (H) 09/13/2022 0959   TRIG CANCELED 11/07/2014 0921   TRIG SEE BELOW 11/07/2014 0921   TRIG 140 04/26/2014 0757   HDL 41 09/13/2022 0959   HDL CANCELED 11/07/2014 0921   HDL SEE BELOW 11/07/2014 0921   HDL 39 (L) 04/26/2014 0757   CHOLHDL 2.1 02/09/2022 1049   CHOLHDL 2.6 04/18/2015 0930   VLDL 22 04/18/2015 0930   LDLCALC 41 09/13/2022 0959   LDLCALC CANCELED 11/07/2014 0921   LDLCALC SEE BELOW 11/07/2014 0921   LDLCALC 61 04/26/2014 0757   Hepatic Function Panel     Component Value Date/Time   PROT 7.0 09/13/2022 0959   ALBUMIN 4.5 09/13/2022 0959   AST 26 09/13/2022 0959   ALT 35 09/13/2022 0959   ALKPHOS 106 09/13/2022 0959   BILITOT 0.8 09/13/2022 0959   BILIDIR 0.19 02/21/2017 0934   IBILI 0.5 11/07/2014 0921      Component Value Date/Time   TSH 2.600 07/13/2021 0938   Nutritional Lab Results  Component Value Date   VD25OH 40.3 09/13/2022   VD25OH 76.4 02/09/2022   VD25OH 24.4 (L) 05/21/2021    Attestations:   I, Clinical biochemist, acting as a Stage manager for [employer], DO., have compiled all relevant documentation for today's office visit on behalf of Thomasene Lot, DO, while in the presence of [employer], DO.  I have reviewed the above documentation for accuracy and completeness, and I agree with the above. Wayne Jordan, D.O.  The 21st Century Cures Act was signed into law in 2016 which includes the topic of electronic health records.  This provides immediate access to information in MyChart.  This includes consultation notes, operative notes, office notes, lab results and pathology reports.  If you have any questions about what you read please let us know at your next visit so we can discuss your concerns and take corrective action  if need be.  We are right here with you.

## 2022-11-17 DIAGNOSIS — G4733 Obstructive sleep apnea (adult) (pediatric): Secondary | ICD-10-CM | POA: Diagnosis not present

## 2022-11-25 ENCOUNTER — Other Ambulatory Visit (HOSPITAL_COMMUNITY): Payer: Self-pay

## 2022-11-29 ENCOUNTER — Other Ambulatory Visit (HOSPITAL_COMMUNITY): Payer: Self-pay

## 2022-11-29 ENCOUNTER — Ambulatory Visit (INDEPENDENT_AMBULATORY_CARE_PROVIDER_SITE_OTHER): Payer: Commercial Managed Care - PPO | Admitting: Family Medicine

## 2022-11-29 ENCOUNTER — Encounter (INDEPENDENT_AMBULATORY_CARE_PROVIDER_SITE_OTHER): Payer: Self-pay | Admitting: Family Medicine

## 2022-11-29 VITALS — BP 133/86 | HR 74 | Temp 97.8°F | Ht 71.0 in | Wt 264.0 lb

## 2022-11-29 DIAGNOSIS — E559 Vitamin D deficiency, unspecified: Secondary | ICD-10-CM | POA: Diagnosis not present

## 2022-11-29 DIAGNOSIS — Z6836 Body mass index (BMI) 36.0-36.9, adult: Secondary | ICD-10-CM | POA: Diagnosis not present

## 2022-11-29 DIAGNOSIS — Z6838 Body mass index (BMI) 38.0-38.9, adult: Secondary | ICD-10-CM | POA: Insufficient documentation

## 2022-11-29 DIAGNOSIS — R7303 Prediabetes: Secondary | ICD-10-CM

## 2022-11-29 MED ORDER — VITAMIN D (ERGOCALCIFEROL) 1.25 MG (50000 UNIT) PO CAPS
50000.0000 [IU] | ORAL_CAPSULE | ORAL | 0 refills | Status: DC
Start: 2022-11-29 — End: 2022-12-27
  Filled 2022-11-29: qty 4, 28d supply, fill #0

## 2022-11-29 NOTE — Progress Notes (Signed)
[name], D.O.  ABFM, ABOM Specializing in Clinical Bariatric Medicine  Office located at: [address] [place], [place]  [address]     Assessment and Plan:   Medications Discontinued During This Encounter  Medication Reason   Vitamin D, Ergocalciferol, (DRISDOL) 1.25 MG (50000 UNIT) CAPS capsule Reorder    Meds ordered this encounter  Medications   Vitamin D, Ergocalciferol, (DRISDOL) 1.25 MG (50000 UNIT) CAPS capsule    Sig: Take 1 capsule by mouth every 7 days.    Dispense:  4 capsule    Refill:  0    Vitamin D deficiency Assessment: Condition is Not at goal.. Pt has not had his vitamin D levels rechecked since August. Patient reports good compliance and tolerance of taking Ergocalciferol once every 7 days and OTC vitamin D supplement daily. He denies any adverse side effects on either medication especially regarding bone health.  Lab Results  Component Value Date   VD25OH 40.3 09/13/2022   VD25OH 76.4 02/09/2022   VD25OH 24.4 (L) 05/21/2021   Plan: - Continue with OTC oral vitamin D supplement and ERGO at current dose as directed. I will refill.   - ideal vitamin D levels reviewed with patient    - Informed patient this may be a lifelong thing, and he was encouraged to continue to take the medicine until told otherwise.     - With Winter approaching his vitamin D levels are prone to drop so we will continue  to monitor levels regularly to keep levels within normal limits and prevent over supplementation.   Prediabetes Assessment: Condition is Not optimized.. This is diet/exercise controlled. Pt endorses that his hunger and cravings are mainly controlled when following the meal plan and increasing his protein intake.  Lab Results  Component Value Date   HGBA1C 5.5 09/13/2022   HGBA1C 5.6 02/09/2022   HGBA1C 5.7 (H) 07/13/2021   INSULIN 23.2 09/13/2022   INSULIN 20.5 02/09/2022   INSULIN 22.2 05/21/2021    Plan: - Continue his prudent nutritional  plan that is low in simple carbohydrates, saturated fats and trans fats to goal of 5-10% weight loss to achieve significant health benefits.  Pt encouraged to continually advance exercise and cardiovascular fitness as tolerated throughout weight loss journey.  - Anticipatory was guidance given today.    - We will recheck A1c and fasting insulin level from last check, or as deemed appropriate.   TREATMENT PLAN FOR OBESITY: BMI 38.0-38.9,adult- current bmi 36.84 Morbid obesity (HCC)-start bmi 39.33 Assessment:  SHINICHI DEGUIA is here to discuss his progress with his obesity treatment plan along with follow-up of his obesity related diagnoses. See Medical Weight Management Flowsheet for complete bioelectrical impedance results.  Condition is improving not optimized. Biometric data collected today, was reviewed with patient.   Since last office visit on 11/01/2022 patient's  Muscle mass has decreased by 3.6lb. Fat mass has decreased by 0.2lb. Total body water has decreased by 72.1lb.  Counseling done on how various foods will affect these numbers and how to maximize success  Total lbs lost to date: 18 Total weight loss percentage to date: 6.38%   Plan: - Skylen is currently in the action stage of change and will continue to adhere to the Category 4 Plan and keeping a food journal and adhering to recommended goals of 1800-1900 calories and 140+ protein.   Behavioral Intervention Additional resources provided today:  food log, thanksgiving strategy handout, and recipes Evidence-based interventions for health behavior change were  utilized today including the discussion of self monitoring techniques, problem-solving barriers and SMART goal setting techniques.   Regarding patient's less desirable eating habits and patterns, we employed the technique of small changes.  Pt will specifically work on: Accurately measure calorie and protein intake and bring in food log for next visit.     He has  agreed to Continue current level of physical activity    FOLLOW UP: Return in about 4 weeks (around 12/27/2022).  He was informed of the importance of frequent follow up visits to maximize his success with intensive lifestyle modifications for his multiple health conditions.  Subjective:   Chief complaint: Obesity Daigan is here to discuss his progress with his obesity treatment plan. He is on the the Category 4 Plan and keeping a food journal and adhering to recommended goals of 1800-1900 calories and 140+ protein and states he is following his eating plan approximately 50% of the time. He states he is going to the gym 40-50 minutes 3 days per week.  Interval History:  RYNELL CARNER is here for a follow up office visit.     Since last office visit:  Pt has been well since last OV. Pt endorses getting about 120-140g of protein daily but informed me that he has stopped eating lunch. He states that he is just not hungry during those times since his lunch time has changed at work. He drinks protein shakes and 2 eggs with cheese for breakfast, cheese and [place] for lunch, and spinach, asparagus, and mushroom quiche, half cup of broccoli, and 6-8oz of chicken for dinner. He has stopped eating rice and bread. Pt does not consistently journal.   We reviewed his meal plan and all questions were answered.   Review of Systems:  Pertinent positives were addressed with patient today.  Reviewed by clinician on day of visit: allergies, medications, problem list, medical history, surgical history, family history, social history, and previous encounter notes.  Weight Summary and Biometrics   Weight Lost Since Last Visit: 4lb  Weight Gained Since Last Visit: 0lb   Vitals Temp: 97.8 F (36.6 C) BP: 133/86 Pulse Rate: 74 SpO2: 96 %   Anthropometric Measurements Height: 5\' 11"  (1.803 m) Weight: 264 lb (119.7 kg) BMI (Calculated): 36.84 Weight at Last Visit: 268lb Weight Lost Since Last Visit:  4lb Weight Gained Since Last Visit: 0lb Starting Weight: 282lb Total Weight Loss (lbs): 18 lb (8.165 kg) Peak Weight: 290lb   Body Composition  Body Fat %: 35 % Fat Mass (lbs): 92.6 lbs Muscle Mass (lbs): 163.8 lbs Total Body Water (lbs): 116 lbs Visceral Fat Rating : 22   Other Clinical Data Fasting: no Labs: no Today's Visit #: 20 Starting Date: 05/21/21     Objective:   PHYSICAL EXAM: Blood pressure 133/86, pulse 74, temperature 97.8 F (36.6 C), height 5\' 11"  (1.803 m), weight 264 lb (119.7 kg), SpO2 96%. Body mass index is 36.82 kg/m.  General: Well Developed, well nourished, and in no acute distress.  HEENT: Normocephalic, atraumatic Skin: Warm and dry, cap RF less 2 sec, good turgor Chest:  Normal excursion, shape, no gross abn Respiratory: speaking in full sentences, no conversational dyspnea NeuroM-Sk: Ambulates w/o assistance, moves * 4 Psych: A and O *3, insight good, mood-full  DIAGNOSTIC DATA REVIEWED:  BMET    Component Value Date/Time   NA 143 09/13/2022 0959   K 4.2 09/13/2022 0959   CL 105 09/13/2022 0959   CO2 21 09/13/2022 0959   GLUCOSE  99 09/13/2022 0959   GLUCOSE 107 (H) 10/17/2020 1200   BUN 12 09/13/2022 0959   CREATININE 1.03 09/13/2022 0959   CALCIUM 9.8 09/13/2022 0959   GFRNONAA >60 10/17/2020 1200   GFRAA >60 01/08/2019 1145   Lab Results  Component Value Date   HGBA1C 5.5 09/13/2022   HGBA1C 5.6 10/07/2012   Lab Results  Component Value Date   INSULIN 23.2 09/13/2022   INSULIN 22.2 05/21/2021   Lab Results  Component Value Date   TSH 2.600 07/13/2021   CBC    Component Value Date/Time   WBC 7.9 06/25/2022 1006   WBC 6.5 10/17/2020 1200   RBC 5.27 06/25/2022 1006   RBC 5.06 10/17/2020 1200   HGB 15.5 06/25/2022 1006   HCT 47.7 06/25/2022 1006   PLT 186 06/25/2022 1006   MCV 91 06/25/2022 1006   MCH 29.4 06/25/2022 1006   MCH 30.2 10/17/2020 1200   MCHC 32.5 06/25/2022 1006   MCHC 32.7 10/17/2020 1200    RDW 12.8 06/25/2022 1006   Iron Studies No results found for: "IRON", "TIBC", "FERRITIN", "IRONPCTSAT" Lipid Panel     Component Value Date/Time   CHOL 115 09/13/2022 0959   CHOL CANCELED 11/07/2014 0921   CHOL SEE BELOW 11/07/2014 0921   CHOL 128 04/26/2014 0757   TRIG 210 (H) 09/13/2022 0959   TRIG CANCELED 11/07/2014 0921   TRIG SEE BELOW 11/07/2014 0921   TRIG 140 04/26/2014 0757   HDL 41 09/13/2022 0959   HDL CANCELED 11/07/2014 0921   HDL SEE BELOW 11/07/2014 0921   HDL 39 (L) 04/26/2014 0757   CHOLHDL 2.1 02/09/2022 1049   CHOLHDL 2.6 04/18/2015 0930   VLDL 22 04/18/2015 0930   LDLCALC 41 09/13/2022 0959   LDLCALC CANCELED 11/07/2014 0921   LDLCALC SEE BELOW 11/07/2014 0921   LDLCALC 61 04/26/2014 0757   Hepatic Function Panel     Component Value Date/Time   PROT 7.0 09/13/2022 0959   ALBUMIN 4.5 09/13/2022 0959   AST 26 09/13/2022 0959   ALT 35 09/13/2022 0959   ALKPHOS 106 09/13/2022 0959   BILITOT 0.8 09/13/2022 0959   BILIDIR 0.19 02/21/2017 0934   IBILI 0.5 11/07/2014 0921      Component Value Date/Time   TSH 2.600 07/13/2021 0938   Nutritional Lab Results  Component Value Date   VD25OH 40.3 09/13/2022   VD25OH 76.4 02/09/2022   VD25OH 24.4 (L) 05/21/2021    Attestations:   I, Clinical biochemist, acting as a Stage manager for [employer], DO., have compiled all relevant documentation for today's office visit on behalf of Thomasene Lot, DO, while in the presence of [employer], DO.  I have reviewed the above documentation for accuracy and completeness, and I agree with the above. [name], D.O.  The 21st Century Cures Act was signed into law in 2016 which includes the topic of electronic health records.  This provides immediate access to information in MyChart.  This includes consultation notes, operative notes, office notes, lab results and pathology reports.  If you have any questions about what you read please let us know at  your next visit so we can discuss your concerns and take corrective action if need be.  We are right here with you.

## 2022-12-27 ENCOUNTER — Encounter (INDEPENDENT_AMBULATORY_CARE_PROVIDER_SITE_OTHER): Payer: Self-pay | Admitting: Family Medicine

## 2022-12-27 ENCOUNTER — Ambulatory Visit (INDEPENDENT_AMBULATORY_CARE_PROVIDER_SITE_OTHER): Payer: Commercial Managed Care - PPO | Admitting: Family Medicine

## 2022-12-27 DIAGNOSIS — E559 Vitamin D deficiency, unspecified: Secondary | ICD-10-CM | POA: Diagnosis not present

## 2022-12-27 DIAGNOSIS — I2510 Atherosclerotic heart disease of native coronary artery without angina pectoris: Secondary | ICD-10-CM

## 2022-12-27 DIAGNOSIS — Z6837 Body mass index (BMI) 37.0-37.9, adult: Secondary | ICD-10-CM | POA: Diagnosis not present

## 2022-12-27 DIAGNOSIS — R7303 Prediabetes: Secondary | ICD-10-CM

## 2022-12-27 MED ORDER — VITAMIN D (ERGOCALCIFEROL) 1.25 MG (50000 UNIT) PO CAPS: 50000.0000 [IU] | ORAL_CAPSULE | ORAL | 0 refills | Status: DC

## 2022-12-27 NOTE — Progress Notes (Signed)
Wayne Jordan, D.O.  ABFM, ABOM Specializing in Clinical Bariatric Medicine  Office located at: [address] [place], [place]  [address]   Assessment and Plan:   Order fasting labs at next visit - Vitamin D, A1C, CMP, FLP, etc  FOR THE DISEASE OF OBESITY: Morbid obesity (HCC)-start bmi 39.33 BMI 37.0-37.9, adult-current bmi 37.67 Assessment & Plan: Since last office visit on 11/29/22 patient's muscle mass has increased by 2.2lb. Fat mass has increased by 2.8lb. Total body water has increased by 4lb.  Counseling done on how various foods will affect these numbers and how to maximize success  Total lbs lost to date: 12 lbs Total weight loss percentage to date: -4.26 %  Recommended Dietary Goals Macaleb is currently in the action stage of change. As such, his goal is to continue weight management plan.  He has agreed to: continue current plan and increase protein intake   Behavioral Intervention We discussed the following today: increasing lean protein intake to established goals, avoiding skipping meals, increasing water intake , work on tracking and journaling calories using tracking application, continue to practice mindfulness when eating, and continue to work on maintaining a reduced calorie state, getting the recommended amount of protein, incorporating whole foods, making healthy choices, staying well hydrated and practicing mindfulness when eating.  Additional resources provided today:  pulled pork recipe  Evidence-based interventions for health behavior change were utilized today including the discussion of self monitoring techniques, problem-solving barriers and SMART goal setting techniques.   Regarding patient's less desirable eating habits and patterns, we employed the technique of small changes.   Pt will specifically work on: Increase protein intake with lean proteins for next visit.   Meal plan change: Continue on Category 4 plan and journal at 1700-1800  calories and 130+ g of protein daily   Recommended Physical Activity Goals Shamarcus has been advised to work up to 150 minutes of moderate intensity aerobic activity a week and strengthening exercises 2-3 times per week for cardiovascular health, weight loss maintenance and preservation of muscle mass.   He has agreed to :  Continue current level of physical activity , Think about enjoyable ways to increase daily physical activity and overcoming barriers to exercise, Increase physical activity in their day and reduce sedentary time (increase NEAT)., and continue to gradually increase the amount and intensity of exercise    Pharmacotherapy We discussed various medication options to help Arhaan with his weight loss efforts and we both agreed to : continue with nutritional and behavioral strategies   FOR ASSOCIATED CONDITIONS ADDRESSED TODAY: Vitamin D deficiency Assessment & Plan: Lab Results  Component Value Date   VD25OH 40.3 09/13/2022   Pt compliant with ERGO 50K units weekly. Tolerating well, no reported negative side effects. Continue with Vitamin D supplement. Will refill ERGO today.  Orders: -     Vitamin D (Ergocalciferol); Take 1 capsule by mouth every 7 days.  Dispense: 4 capsule; Refill: 0   Prediabetes Assessment & Plan: Lab Results  Component Value Date   HGBA1C 5.5 09/13/2022   HGBA1C 5.6 02/09/2022   HGBA1C 5.7 (H) 07/13/2021   INSULIN 23.2 09/13/2022   INSULIN 20.5 02/09/2022   INSULIN 22.2 05/21/2021    Hunger and cravings are well controlled. Not currently managing his condition with medication. Pt reports difficulty meeting protein intake goal. He has reduced the amount of rice and bread he eats.   Extensive counseling on increasing lean protein intake, importance of accurate measuring of  proteins, and mindful eating/exercise habits to reduce the progression of his condition to diabetes. Advised pt to aim to eat 4 oz at lunch and 8-10oz at dinner and ensure  correct calculation of protein intake. Limit protein shake consumption to one shake a day and instead focus on eating all meals. Continue with current regimen.    Coronary artery disease, unspecified vessel or lesion type, unspecified whether angina present, unspecified whether native or transplanted heart Assessment & Plan: Lab Results  Component Value Date   CHOL 115 09/13/2022   HDL 41 09/13/2022   LDLCALC 41 09/13/2022   TRIG 210 (H) 09/13/2022   CHOLHDL 2.1 02/09/2022   BP at goal today at 126/83. Pt reports positive history of heart attack. Cardiac catheterization performed on 06/28/22. He is currently on ASA 81 mg, Atorvastatin 80 mg, and Plavix 75 mg,  managed by his cardiologist. His triglycerides were above goal at 210 per labs obtained on 09/13/22.  Recommend pt work on eating on-plan to help manage his cholesterol with healthy diet. Avoid eating fatty foods/meats. Continue on current regimen per specialist/PCP. Continue to follow up with PCP/cardiology for the monitoring of this condition.   Follow up:   Return in about 5 weeks (around 01/31/2023). He was informed of the importance of frequent follow up visits to maximize his success with intensive lifestyle modifications for his multiple health conditions.  Subjective:   Chief complaint: Obesity [name] is here to discuss his progress with his obesity treatment plan. He is on the Category 4 Plan and keeping a food journal and adhering to recommended goals of 1800-1900 calories and 140+g  protein and states he is following his eating plan approximately 50% of the time. He states he is exercising at the gym 40 minutes 3 days per week.  Interval History:  XAZIER KOCHAN is here for a follow up office visit. Since last OV, he reports having a large gathering for Thanksgiving with about 28 people at his house. Had some [place] but mostly cooked ham. Still struggles to meet protein intake goal on some days. He reports days where he eats  eggs for breakfast, tuna for lunch, 2 chicken breasts for dinner and will still end up at 85 g of protein at the end of the day. Doesn't tend to enjoy protein shakes due to "chalky" texture. For lunch he eats canned tuna (38 g of protein) with 10 crackers.   Per pt journal log: 1355 calories and 44.6 g of protein, 2205 calories and 130g protein, 1510 calories and 95 g protein, 1946 calories and 89g protein Average: 1800 cal and 99 g protein   Barriers identified: limited food variation or intolerances.   Pharmacotherapy for weight loss: He is currently taking no anti-obesity medication.   Review of Systems:  Pertinent positives were addressed with patient today.  Reviewed by clinician on day of visit: allergies, medications, problem list, medical history, surgical history, family history, social history, and previous encounter notes.  Weight Summary and Biometrics   Weight Lost Since Last Visit: 0lb  Weight Gained Since Last Visit: 6lb    Vitals Temp: 97.7 F (36.5 C) BP: 126/83 Pulse Rate: 88 SpO2: 97 %   Anthropometric Measurements Height: 5\' 11"  (1.803 m) Weight: 270 lb (122.5 kg) BMI (Calculated): 37.67 Weight at Last Visit: 264lb Weight Lost Since Last Visit: 0lb Weight Gained Since Last Visit: 6lb Starting Weight: 282lb Total Weight Loss (lbs): 12 lb (5.443 kg) Peak Weight: 290lb   Body Composition  Body  Fat %: 35.3 % Fat Mass (lbs): 95.4 lbs Muscle Mass (lbs): 166 lbs Total Body Water (lbs): 120 lbs Visceral Fat Rating : 22   Other Clinical Data Fasting: no Labs: no Today's Visit #: 21 Starting Date: 05/21/21    Objective:   PHYSICAL EXAM: Blood pressure 126/83, pulse 88, temperature 97.7 F (36.5 C), height 5\' 11"  (1.803 m), weight 270 lb (122.5 kg), SpO2 97%. Body mass index is 37.66 kg/m.  General: he is overweight, cooperative and in no acute distress. PSYCH: Has normal mood, affect and thought process.   HEENT: EOMI, sclerae are  anicteric. Lungs: Normal breathing effort, no conversational dyspnea. Extremities: Moves * 4 Neurologic: A and O * 3, good insight  DIAGNOSTIC DATA REVIEWED: BMET    Component Value Date/Time   NA 143 09/13/2022 0959   K 4.2 09/13/2022 0959   CL 105 09/13/2022 0959   CO2 21 09/13/2022 0959   GLUCOSE 99 09/13/2022 0959   GLUCOSE 107 (H) 10/17/2020 1200   BUN 12 09/13/2022 0959   CREATININE 1.03 09/13/2022 0959   CALCIUM 9.8 09/13/2022 0959   GFRNONAA >60 10/17/2020 1200   GFRAA >60 01/08/2019 1145   Lab Results  Component Value Date   HGBA1C 5.5 09/13/2022   HGBA1C 5.6 10/07/2012   Lab Results  Component Value Date   INSULIN 23.2 09/13/2022   INSULIN 22.2 05/21/2021   Lab Results  Component Value Date   TSH 2.600 07/13/2021   CBC    Component Value Date/Time   WBC 7.9 06/25/2022 1006   WBC 6.5 10/17/2020 1200   RBC 5.27 06/25/2022 1006   RBC 5.06 10/17/2020 1200   HGB 15.5 06/25/2022 1006   HCT 47.7 06/25/2022 1006   PLT 186 06/25/2022 1006   MCV 91 06/25/2022 1006   MCH 29.4 06/25/2022 1006   MCH 30.2 10/17/2020 1200   MCHC 32.5 06/25/2022 1006   MCHC 32.7 10/17/2020 1200   RDW 12.8 06/25/2022 1006   Iron Studies No results found for: "IRON", "TIBC", "FERRITIN", "IRONPCTSAT" Lipid Panel     Component Value Date/Time   CHOL 115 09/13/2022 0959   CHOL CANCELED 11/07/2014 0921   CHOL SEE BELOW 11/07/2014 0921   CHOL 128 04/26/2014 0757   TRIG 210 (H) 09/13/2022 0959   TRIG CANCELED 11/07/2014 0921   TRIG SEE BELOW 11/07/2014 0921   TRIG 140 04/26/2014 0757   HDL 41 09/13/2022 0959   HDL CANCELED 11/07/2014 0921   HDL SEE BELOW 11/07/2014 0921   HDL 39 (L) 04/26/2014 0757   CHOLHDL 2.1 02/09/2022 1049   CHOLHDL 2.6 04/18/2015 0930   VLDL 22 04/18/2015 0930   LDLCALC 41 09/13/2022 0959   LDLCALC CANCELED 11/07/2014 0921   LDLCALC SEE BELOW 11/07/2014 0921   LDLCALC 61 04/26/2014 0757   Hepatic Function Panel     Component Value Date/Time    PROT 7.0 09/13/2022 0959   ALBUMIN 4.5 09/13/2022 0959   AST 26 09/13/2022 0959   ALT 35 09/13/2022 0959   ALKPHOS 106 09/13/2022 0959   BILITOT 0.8 09/13/2022 0959   BILIDIR 0.19 02/21/2017 0934   IBILI 0.5 11/07/2014 0921      Component Value Date/Time   TSH 2.600 07/13/2021 0938   Nutritional Lab Results  Component Value Date   VD25OH 40.3 09/13/2022   VD25OH 76.4 02/09/2022   VD25OH 24.4 (L) 05/21/2021    Attestations:   Burnett Sheng, acting as a Stage manager for Thomasene Lot, DO., have compiled all relevant  documentation for today's office visit on behalf of Thomasene Lot, DO, while in the presence of Thomasene Lot, DO.  Reviewed by clinician on day of visit: allergies, medications, problem list, medical history, surgical history, family history, social history, and previous encounter notes pertinent to patient's obesity diagnosis.  I have reviewed the above documentation for accuracy and completeness, and I agree with the above. Wayne Jordan, D.O.  The 21st Century Cures Act was signed into law in 2016 which includes the topic of electronic health records.  This provides immediate access to information in MyChart.  This includes consultation notes, operative notes, office notes, lab results and pathology reports.  If you have any questions about what you read please let us know at your next visit so we can discuss your concerns and take corrective action if need be.  We are right here with you.

## 2022-12-31 ENCOUNTER — Other Ambulatory Visit (HOSPITAL_COMMUNITY): Payer: Self-pay

## 2023-01-03 ENCOUNTER — Other Ambulatory Visit (HOSPITAL_COMMUNITY): Payer: Self-pay

## 2023-01-03 ENCOUNTER — Other Ambulatory Visit: Payer: Self-pay

## 2023-01-03 MED ORDER — ERGOCALCIFEROL 1.25 MG (50000 UT) PO CAPS
50000.0000 [IU] | ORAL_CAPSULE | ORAL | 0 refills | Status: DC
  Filled 2023-01-03: qty 4, 28d supply, fill #0

## 2023-01-31 ENCOUNTER — Ambulatory Visit (INDEPENDENT_AMBULATORY_CARE_PROVIDER_SITE_OTHER): Payer: Commercial Managed Care - PPO | Admitting: Family Medicine

## 2023-01-31 ENCOUNTER — Other Ambulatory Visit: Payer: Self-pay

## 2023-01-31 ENCOUNTER — Encounter (INDEPENDENT_AMBULATORY_CARE_PROVIDER_SITE_OTHER): Payer: Self-pay | Admitting: Family Medicine

## 2023-01-31 VITALS — BP 138/79 | HR 90 | Temp 97.5°F | Ht 71.0 in | Wt 275.0 lb

## 2023-01-31 DIAGNOSIS — I10 Essential (primary) hypertension: Secondary | ICD-10-CM

## 2023-01-31 DIAGNOSIS — R7303 Prediabetes: Secondary | ICD-10-CM

## 2023-01-31 DIAGNOSIS — E7849 Other hyperlipidemia: Secondary | ICD-10-CM

## 2023-01-31 DIAGNOSIS — E559 Vitamin D deficiency, unspecified: Secondary | ICD-10-CM | POA: Diagnosis not present

## 2023-01-31 DIAGNOSIS — Z6838 Body mass index (BMI) 38.0-38.9, adult: Secondary | ICD-10-CM | POA: Diagnosis not present

## 2023-01-31 DIAGNOSIS — Z6837 Body mass index (BMI) 37.0-37.9, adult: Secondary | ICD-10-CM | POA: Diagnosis not present

## 2023-01-31 MED ORDER — VITAMIN D (ERGOCALCIFEROL) 1.25 MG (50000 UNIT) PO CAPS
50000.0000 [IU] | ORAL_CAPSULE | ORAL | 0 refills | Status: DC
Start: 2023-01-31 — End: 2023-03-21
  Filled 2023-01-31: qty 4, 28d supply, fill #0

## 2023-01-31 NOTE — Progress Notes (Signed)
 Wayne Jordan, D.O.  ABFM, ABOM Specializing in Clinical Bariatric Medicine  Office located at: [address] [place], [place]  [address]   Assessment and Plan:   Labs obtained today (A1C, fasting insulin , lipid panel, vitamin D , and B12) - will review at his next OV.  FOR THE DISEASE OF OBESITY: Morbid obesity (HCC)-start bmi 39.33 BMI 37.0-37.9, adult-current bmi 38.37 Assessment & Plan: Since last office visit on 12/27/22 patient's muscle mass has increased by 4.2lb. Fat mass has increased by 1.4lb. Total body water has increased by 1.2lb.  Counseling done on how various foods will affect these numbers and how to maximize success  Total lbs lost to date: 7 lbs Total weight loss percentage to date:  -2.48%   Recommended Dietary Goals Wayne Jordan is currently in the action stage of change. As such, his goal is to continue weight management plan.  He is currently tracking 1830-1930 calories a day and 120+ g of protein per his journal.   He has agreed to: continue current plan   Behavioral Intervention We discussed the following today: increasing lean protein intake to established goals, decreasing simple carbohydrates , and work on tracking and journaling calories using tracking application  Additional resources provided today: Handout and personalized instruction on tracking and journaling using Apps or handwriting and using logs provided, Handout on complex carbohydrates and lean sources of protein, and handout on protein content of various foods   Evidence-based interventions for health behavior change were utilized today including the discussion of self monitoring techniques, problem-solving barriers and SMART goal setting techniques.   Regarding patient's less desirable eating habits and patterns, we employed the technique of small changes.   Pt will specifically work on: track his daily calorie intake and increase weight lifting for next visit.    Recommended  Physical Activity Goals Wayne Jordan has been advised to work up to 150 minutes of moderate intensity aerobic activity a week and strengthening exercises 2-3 times per week for cardiovascular health, weight loss maintenance and preservation of muscle mass.   He has agreed to :  Increase the intensity, frequency or duration of strengthening exercises    Pharmacotherapy We discussed various medication options to help Wayne Jordan with his weight loss efforts and we both agreed to : continue with nutritional and behavioral strategies   FOR ASSOCIATED CONDITIONS ADDRESSED TODAY: Vitamin D  deficiency Assessment & Plan: Lab Results  Component Value Date   VD25OH 40.3 09/13/2022   VD25OH 76.4 02/09/2022   VD25OH 24.4 (L) 05/21/2021   Pt was taking ERGO twice weekly prior to vitamin D  being 76.4 from labs obtained on 02/09/22. His ERGO was then decreased to once weekly. Currently he is taking ERGO 50K units once weekly and 5000 IUs daily OTC. Tolerating well, no reported side effects. When he doesn't have plans he sluggs around the house but otherwise has improved energy. No acute concerns today. I recommend he continue his current supplementation regimen. Will recheck vitamin D  levels today and review at his next office visit.   Orders: - Refill ERGO, no dose changes.  - Recheck vitamin D  today    Prediabetes Assessment & Plan: Lab Results  Component Value Date   HGBA1C 5.5 09/13/2022   HGBA1C 5.6 02/09/2022   HGBA1C 5.7 (H) 07/13/2021   INSULIN  23.2 09/13/2022   INSULIN  20.5 02/09/2022   INSULIN  22.2 05/21/2021   A1C and insulin  are stable at 5.5 and 23.2 respectively as of 09/13/22. Diet/exercise controlled. Reports having eliminated any unhealthy  foods from his home/environment. Per his journal, he is tracking 1830-1930 calories and 120+ g of protein a day.   Discussed the benefits and importance of being in a calorie deficit as it pertains to weight loss. Reviewed multiple resources/handouts  on sources of high protein and complex carbs. Continue tracking his daily calorie/protein intake in his journal. Encouraged pt to increase his weight training in his exercise regimen. Will recheck his A1C and fasting insulin  levels today and review results at his next visit.   Orders: - Recheck A1C today - Recheck fasting insulin  today   Other hyperlipidemia Assessment & Plan: Lab Results  Component Value Date   CHOL 115 09/13/2022   HDL 41 09/13/2022   LDLCALC 41 09/13/2022   TRIG 210 (H) 09/13/2022   CHOLHDL 2.1 02/09/2022   Compliant with Atorvastatin  80 mg once daily in the morning. Tolerating well, no adverse side effects. Triglycerides were elevated at 210 as of last obtained labs.   Reviewed ideal triglyceride levels. Avoid simple carbs/sugars and processed foods. Increase protein intake and daily water consumption. Continue with his statin therapy as directed by his PCP/specialists. Will recheck lipid panel today and review at his next follow up visit.   Orders: - Recheck lipid panel   Essential hypertension Assessment & Plan: BP Readings from Last 3 Encounters:  01/31/23 138/79  12/27/22 126/83  11/29/22 133/86   BP is at high end of goal today.  HTN treated with Amlodipine  5 mg once daily. Tolerating well. Pt is asymptomatic and denies any SE to antihypertensives today.   Reviewed BP goal of 140/90 and below. Reminded patient that if they ever feel poorly in any way, to check their blood pressure and pulse as well. Avoid buying foods that are: processed, frozen, or prepackaged to avoid excess salt. We will continue to monitor closely alongside PCP/ specialists.    Orders: -Recheck B12 today and review at next visit.   Follow up:   Return in about 2 weeks (around 02/14/2023). He was informed of the importance of frequent follow up visits to maximize his success with intensive lifestyle modifications for his multiple health conditions.  Subjective:   Chief  complaint: Obesity Wayne Jordan is here to discuss his progress with his obesity treatment plan. He is on the Category 4 Plan and keeping a food journal and adhering to recommended goals of 1700-1800 calories and 130+ g of protein and states he is following his eating plan approximately 50% of the time. He states he is exercising by going to the gym 3 times a week (Mon/Wed/Fri) doing about 20 minutes of cardio and about 20  minutes of weight training. Started a emergency planning/management officer program for about 15 minutes 2 days a week (Tues/Thurs) - started right before Christmas). Pt has not incorporated weights just yet, wants to first get accustomed to the program.   Interval History:  Wayne Jordan is here for a follow up office visit. Since last OV, he is up 5 lbs. He reports some off plan eating over the holidays, ate a lot of deserts. Only had one day that he did not meet his daily protein intake goal. Eats a lot of fish.    Barriers identified: none.   Pharmacotherapy for weight loss: He is currently taking no anti-obesity medication.   Review of Systems:  Pertinent positives were addressed with patient today.  Reviewed by clinician on day of visit: allergies, medications, problem list, medical history, surgical history, family history, social history, and previous encounter  notes.  Weight Summary and Biometrics   Weight Lost Since Last Visit: 0 lb  Weight Gained Since Last Visit: 5 lb    Vitals Temp: (!) 97.5 F (36.4 C) BP: 138/79 Pulse Rate: 90 SpO2: 95 %   Anthropometric Measurements Height: 5' 11 (1.803 m) Weight: 275 lb (124.7 kg) BMI (Calculated): 38.37 Weight at Last Visit: 270 lb Weight Lost Since Last Visit: 0 lb Weight Gained Since Last Visit: 5 lb Starting Weight: 282 lb Total Weight Loss (lbs): 7 lb (3.175 kg) Peak Weight: 290 lb   Body Composition  Body Fat %: 35.1 % Fat Mass (lbs): 96.8 lbs Muscle Mass (lbs): 170.2 lbs Total Body Water (lbs): 121.2  lbs Visceral Fat Rating : 22   Other Clinical Data Fasting: Yes Labs: Yes Today's Visit #: 22 Starting Date: 05/21/21    Objective:   PHYSICAL EXAM: Blood pressure 138/79, pulse 90, temperature (!) 97.5 F (36.4 C), height 5' 11 (1.803 m), weight 275 lb (124.7 kg), SpO2 95%. Body mass index is 38.35 kg/m.  General: he is overweight, cooperative and in no acute distress. PSYCH: Has normal mood, affect and thought process.   HEENT: EOMI, sclerae are anicteric. Lungs: Normal breathing effort, no conversational dyspnea. Extremities: Moves * 4 Neurologic: A and O * 3, good insight  DIAGNOSTIC DATA REVIEWED: BMET    Component Value Date/Time   NA 143 09/13/2022 0959   K 4.2 09/13/2022 0959   CL 105 09/13/2022 0959   CO2 21 09/13/2022 0959   GLUCOSE 99 09/13/2022 0959   GLUCOSE 107 (H) 10/17/2020 1200   BUN 12 09/13/2022 0959   CREATININE 1.03 09/13/2022 0959   CALCIUM  9.8 09/13/2022 0959   GFRNONAA >60 10/17/2020 1200   GFRAA >60 01/08/2019 1145   Lab Results  Component Value Date   HGBA1C 5.5 09/13/2022   HGBA1C 5.6 10/07/2012   Lab Results  Component Value Date   INSULIN  23.2 09/13/2022   INSULIN  22.2 05/21/2021   Lab Results  Component Value Date   TSH 2.600 07/13/2021   CBC    Component Value Date/Time   WBC 7.9 06/25/2022 1006   WBC 6.5 10/17/2020 1200   RBC 5.27 06/25/2022 1006   RBC 5.06 10/17/2020 1200   HGB 15.5 06/25/2022 1006   HCT 47.7 06/25/2022 1006   PLT 186 06/25/2022 1006   MCV 91 06/25/2022 1006   MCH 29.4 06/25/2022 1006   MCH 30.2 10/17/2020 1200   MCHC 32.5 06/25/2022 1006   MCHC 32.7 10/17/2020 1200   RDW 12.8 06/25/2022 1006   Iron Studies No results found for: IRON, TIBC, FERRITIN, IRONPCTSAT Lipid Panel     Component Value Date/Time   CHOL 115 09/13/2022 0959   CHOL CANCELED 11/07/2014 0921   CHOL SEE BELOW 11/07/2014 0921   CHOL 128 04/26/2014 0757   TRIG 210 (H) 09/13/2022 0959   TRIG CANCELED 11/07/2014  0921   TRIG SEE BELOW 11/07/2014 0921   TRIG 140 04/26/2014 0757   HDL 41 09/13/2022 0959   HDL CANCELED 11/07/2014 0921   HDL SEE BELOW 11/07/2014 0921   HDL 39 (L) 04/26/2014 0757   CHOLHDL 2.1 02/09/2022 1049   CHOLHDL 2.6 04/18/2015 0930   VLDL 22 04/18/2015 0930   LDLCALC 41 09/13/2022 0959   LDLCALC CANCELED 11/07/2014 0921   LDLCALC SEE BELOW 11/07/2014 0921   LDLCALC 61 04/26/2014 0757   Hepatic Function Panel     Component Value Date/Time   PROT 7.0 09/13/2022 0959  ALBUMIN 4.5 09/13/2022 0959   AST 26 09/13/2022 0959   ALT 35 09/13/2022 0959   ALKPHOS 106 09/13/2022 0959   BILITOT 0.8 09/13/2022 0959   BILIDIR 0.19 02/21/2017 0934   IBILI 0.5 11/07/2014 0921      Component Value Date/Time   TSH 2.600 07/13/2021 0938   Nutritional Lab Results  Component Value Date   VD25OH 40.3 09/13/2022   VD25OH 76.4 02/09/2022   VD25OH 24.4 (L) 05/21/2021    Attestations:   LILLETTE Vernell Forest, acting as a medical scribe for Wayne [name], DO., have compiled all relevant documentation for today's office visit on behalf of Wayne [name], DO, while in the presence of [employer], DO.  Reviewed by clinician on day of visit: allergies, medications, problem list, medical history, surgical history, family history, social history, and previous encounter notes pertinent to patient's obesity diagnosis.  I have spent 41 minutes in the care of the patient today including: preparing to see patient (e.g. review and interpretation of tests, old notes ), obtaining and/or reviewing separately obtained history, performing a medically appropriate examination or evaluation, counseling and educating the patient, ordering medications, test or procedures, documenting clinical information in the electronic or other health care record, and independently interpreting results and communicating results to the patient, family, or caregiver   I have reviewed the above documentation for accuracy  and completeness, and I agree with the above. Wayne JINNY Jordan, D.O.  The 21st Century Cures Act was signed into law in 2016 which includes the topic of electronic health records.  This provides immediate access to information in MyChart.  This includes consultation notes, operative notes, office notes, lab results and pathology reports.  If you have any questions about what you read please let us  know at your next visit so we can discuss your concerns and take corrective action if need be.  We are right here with you.

## 2023-02-02 LAB — VITAMIN B12: Vitamin B-12: 406 pg/mL (ref 232–1245)

## 2023-02-02 LAB — HEMOGLOBIN A1C
Est. average glucose Bld gHb Est-mCnc: 117 mg/dL
Hgb A1c MFr Bld: 5.7 % — ABNORMAL HIGH (ref 4.8–5.6)

## 2023-02-02 LAB — LIPID PANEL
Chol/HDL Ratio: 2.8 {ratio} (ref 0.0–5.0)
Cholesterol, Total: 112 mg/dL (ref 100–199)
HDL: 40 mg/dL (ref >39)
LDL Chol Calc (NIH): 36 mg/dL (ref 0–99)
Triglycerides: 229 mg/dL — ABNORMAL HIGH (ref 0–149)
VLDL Cholesterol Cal: 36 mg/dL (ref 5–40)

## 2023-02-02 LAB — VITAMIN D 25 HYDROXY (VIT D DEFICIENCY, FRACTURES): Vit D, 25-Hydroxy: 54 ng/mL (ref 30.0–100.0)

## 2023-02-02 LAB — INSULIN, RANDOM: INSULIN: 21.6 u[IU]/mL (ref 2.6–24.9)

## 2023-02-04 ENCOUNTER — Telehealth: Payer: Self-pay | Admitting: Cardiology

## 2023-02-04 ENCOUNTER — Other Ambulatory Visit: Payer: Self-pay

## 2023-02-04 ENCOUNTER — Other Ambulatory Visit (HOSPITAL_COMMUNITY): Payer: Self-pay

## 2023-02-04 MED ORDER — AMLODIPINE BESYLATE 5 MG PO TABS
5.0000 mg | ORAL_TABLET | Freq: Every day | ORAL | 3 refills | Status: DC
  Filled 2023-02-04: qty 90, 90d supply, fill #0

## 2023-02-04 MED ORDER — CLOPIDOGREL BISULFATE 75 MG PO TABS
75.0000 mg | ORAL_TABLET | Freq: Every day | ORAL | 3 refills | Status: DC
  Filled 2023-02-04: qty 90, 90d supply, fill #0

## 2023-02-04 MED ORDER — ATORVASTATIN CALCIUM 80 MG PO TABS
80.0000 mg | ORAL_TABLET | Freq: Every day | ORAL | 0 refills | Status: DC
  Filled 2023-02-04: qty 90, 90d supply, fill #0

## 2023-02-04 MED ORDER — CLOPIDOGREL BISULFATE 75 MG PO TABS
75.0000 mg | ORAL_TABLET | Freq: Every day | ORAL | 0 refills | Status: DC
  Filled 2023-02-04: qty 90, 90d supply, fill #0

## 2023-02-04 MED ORDER — ATORVASTATIN CALCIUM 80 MG PO TABS
80.0000 mg | ORAL_TABLET | Freq: Every day | ORAL | 3 refills | Status: DC
  Filled 2023-02-04: qty 90, 90d supply, fill #0

## 2023-02-04 MED ORDER — AMLODIPINE BESYLATE 5 MG PO TABS
5.0000 mg | ORAL_TABLET | Freq: Every day | ORAL | 0 refills | Status: DC
  Filled 2023-02-04: qty 90, 90d supply, fill #0

## 2023-02-04 NOTE — Telephone Encounter (Signed)
*  STAT* If patient is at the pharmacy, call can be transferred to refill team.   1. Which medications need to be refilled? (please list name of each medication and dose if known)  atorvastatin (LIPITOR) 80 MG tablet  clopidogrel (PLAVIX) 75 MG tablet   amLODipine (NORVASC) 5 MG tablet   2. Which pharmacy/location (including street and city if local pharmacy) is medication to be sent to? [facility] Phone: [phone]  Fax: [phone]   3. Do they need a 30 day or 90 day supply? 90

## 2023-02-04 NOTE — Addendum Note (Signed)
Addended by: Lonia Farber on: 02/04/2023 10:06 AM   Modules accepted: Orders

## 2023-02-07 NOTE — Progress Notes (Deleted)
Cardiology Office Note:  .   Date:  02/07/2023  ID:  Wayne Jordan, DOB Sep 27, 1960, MRN [id number] PCP: Rema Fendt, NP  [facility] Providers Cardiologist:  Gypsy Balsam, MD { Click to update primary MD,subspecialty MD or Jordan then REFRESH:1}   History of Present Illness: .   Wayne Jordan is a 63 y.o. male with a past medical history of CAD s/p DES to the LAD 2014, hypertension, OSA on CPAP, dyslipidemia, obesity.  06/28/2022 left heart cath mild nonobstructive CAD with widely patent proximal LAD stent 04/22/2021 Lexiscan intermediate risk due to mild ischemia involving basal, mid, apical portion of the inferior wall 07/25/2018 echo EF 60 to 65%, mildly increased LV wall thickness, impaired relaxation 2014 s/p DES to LAD in the setting of a non-STEMI  Most recently evaluated in June 2024 where he was experiencing some episodes of chest pain that had mixed features.  Decision was made to send him for left heart cath which revealed widely patent proximal LAD stent.  LDL a week ago was 36, triglycerides 229  ROS: ROS   Studies Reviewed: .        Cardiac Studies & Procedures   CARDIAC CATHETERIZATION  CARDIAC CATHETERIZATION 06/28/2022  Narrative   Prox RCA lesion is 10% stenosed.   Dist RCA lesion is 10% stenosed.   Mid LAD lesion is 30% stenosed.   Previously placed Prox LAD to Mid LAD stent of unknown type is  widely patent.   The left ventricular systolic function is normal.   Patient has been maintained on aspirin/clopidogrel since his initial PCI.  Mild nonobstructive CAD with widely patent proximal LAD stent with smooth 30% eccentric narrowing medially beyond the stented segment; dual ramus intermediate vessels and moderate size left circumflex coronary artery which are normal; mild coronary calcification of the proximal to mid RCA with mild luminal narrowing of 10% proximally and distally with a dominant RCA.  Normal LV function with EF estimated approximately  55 to 60% without focal segmental wall motion abnormality.  LVEDP is increased at 22 mm Hg.  RECOMMNEDATION: Patient has been maintained on dual antiplatelet therapy since his intervention.  Medical therapy for mild concomitant CAD.  Continue aggressive lipid-lowering therapy with target LDL less than 70.  Findings Coronary Findings Diagnostic  Dominance: Right  Left Anterior Descending Previously placed Prox LAD to Mid LAD stent of unknown type is  widely patent. Mid LAD lesion is 30% stenosed. The lesion was previously treated .  Third Septal Branch Vessel is small in size.  Left Circumflex  Fourth Obtuse Marginal Branch Vessel is small in size.  Right Coronary Artery Prox RCA lesion is 10% stenosed. Dist RCA lesion is 10% stenosed.  Intervention  No interventions have been documented.   STRESS TESTS  MYOCARDIAL PERFUSION IMAGING 04/22/2021  Narrative   Findings are consistent with mild ischemia involving basal, mid and apical portion of the inferior wall. The study is low risk.   The study is intermediate risk.   No ST deviation was noted.   Left ventricular function is normal. Nuclear stress EF: 67 %. The left ventricular ejection fraction is hyperdynamic (>65%). End diastolic cavity size is normal.   Prior study available for comparison from 12/12/2018.  ECHOCARDIOGRAM  ECHOCARDIOGRAM COMPLETE 07/25/2018  Narrative ECHOCARDIOGRAM REPORT    Patient Name:   GIACOMO Jordan Date of Exam: 07/25/2018 Medical Rec #:  [id number]      Height:       71.0 in Accession #:  [id number]     Weight:       284.8 lb Date of Birth:  25-Sep-1960       BSA:          2.45 m Patient Age:    57 years       BP:           114/70 mmHg Patient Gender: M              HR:           55 bpm. Exam Location:  [place]   Procedure: 2D Echo  Indications:    CAD  History:        Patient has no prior history of Echocardiogram examinations. Previous Myocardial Infarction and CAD Risk  Factors: Dyslipidemia.  Sonographer:    Sinda Du RDCS (AE) Referring Phys: [phone] Georgeanna Lea   Sonographer Comments: Technically challenging study due to limited acoustic windows and patient is morbidly obese. IMPRESSIONS   1. The left ventricle has normal systolic function with an ejection fraction of 60-65%. The cavity size was normal. There is mildly increased left ventricular wall thickness. Left ventricular diastolic Doppler parameters are consistent with impaired relaxation. 2. The right ventricle has normal systolic function. The cavity was normal. There is no increase in right ventricular wall thickness.  FINDINGS Left Ventricle: The left ventricle has normal systolic function, with an ejection fraction of 60-65%. The cavity size was normal. There is mildly increased left ventricular wall thickness. Left ventricular diastolic Doppler parameters are consistent with impaired relaxation. Definity contrast agent was given IV to delineate the left ventricular endocardial borders.  Right Ventricle: The right ventricle has normal systolic function. The cavity was normal. There is no increase in right ventricular wall thickness.  Left Atrium: Left atrial size was normal in size.  Right Atrium: Right atrial size was normal in size. Right atrial pressure is estimated at 3 mmHg.  Interatrial Septum: No atrial level shunt detected by color flow Doppler.  Pericardium: There is no evidence of pericardial effusion.  Mitral Valve: The mitral valve is normal in structure. Mitral valve regurgitation was not assessed by color flow Doppler.  Tricuspid Valve: The tricuspid valve is normal in structure. Tricuspid valve regurgitation is trivial by color flow Doppler.  Aortic Valve: The aortic valve is normal in structure. Aortic valve regurgitation is mild by color flow Doppler.  Pulmonic Valve: The pulmonic valve was normal in structure. Pulmonic valve regurgitation was not  assessed by color flow Doppler.  Venous: The inferior vena cava measures 1.71 cm, is normal in size with greater than 50% respiratory variability.   +--------------+--------++ LEFT VENTRICLE         +----------------+---------++ +--------------+--------++ Diastology                PLAX 2D                +----------------+---------++ +--------------+--------++ LV e' lateral:  8.16 cm/s LVIDd:        5.16 cm  +----------------+---------++ +--------------+--------++ LV E/e' lateral:6.2       LVIDs:        3.01 cm  +----------------+---------++ +--------------+--------++ LV e' medial:   7.83 cm/s LV PW:        1.22 cm  +----------------+---------++ +--------------+--------++ LV E/e' medial: 6.5       LV IVS:       1.25 cm  +----------------+---------++ +--------------+--------++ LVOT diam:    2.10 cm  +--------------+--------++ LV SV:  92 ml    +--------------+--------++ LV SV Index:  35.38    +--------------+--------++ LVOT Area:    3.46 cm +--------------+--------++                        +--------------+--------++  +---------------+---------++ RIGHT VENTRICLE          +---------------+---------++ RV S prime:    7.40 cm/s +---------------+---------++ TAPSE (M-mode):2.3 cm    +---------------+---------++  +---------------+-------++-----------++ LEFT ATRIUM           Index       +---------------+-------++-----------++ LA diam:       3.50 cm1.43 cm/m  +---------------+-------++-----------++ LA Vol (A2C):  34.5 ml14.08 ml/m +---------------+-------++-----------++ LA Vol (A4C):  23.2 ml9.47 ml/m  +---------------+-------++-----------++ LA Biplane Vol:28.4 ml11.59 ml/m +---------------+-------++-----------++ +------------+---------++----------++ RIGHT ATRIUM         Index      +------------+---------++----------++ RA Area:    10.80 cm            +------------+---------++----------++ RA Volume:  22.10 ml 9.02 ml/m +------------+---------++----------++ +------------+-----------++ AORTIC VALVE            +------------+-----------++ LVOT Vmax:  83.60 cm/s  +------------+-----------++ LVOT Vmean: 57.400 cm/s +------------+-----------++ LVOT VTI:   0.201 m     +------------+-----------++ AR PHT:     366 msec    +------------+-----------++  +-------------+-------++ AORTA                +-------------+-------++ Ao Root diam:2.80 cm +-------------+-------++ Ao Asc diam: 3.60 cm +-------------+-------++  +--------------+----------++ MITRAL VALVE             +--------------+-------+ +--------------+----------++ SHUNTS                MV Area (PHT):3.17 cm   +--------------+-------+ +--------------+----------++ Systemic VTI: 0.20 m  MV PHT:       69.31 msec +--------------+-------+ +--------------+----------++ Systemic Diam:2.10 cm MV Decel Time:239 msec   +--------------+-------+ +--------------+----------++ +--------------+----------++ MV E velocity:50.60 cm/s +--------------+----------++ MV A velocity:72.40 cm/s +--------------+----------++ MV E/A ratio: 0.70       +--------------+----------++  +---------+-------+ IVC              +---------+-------+ IVC diam:1.71 cm +---------+-------+   Gypsy Balsam MD Electronically signed by Gypsy Balsam MD Signature Date/Time: 07/26/2018/12:36:02 PM    Final    CT SCANS  CT CORONARY MORPH W/CTA COR W/SCORE 05/11/2021  Addendum 05/13/2021 11:15 AM ADDENDUM REPORT: 05/13/2021 11:12  CLINICAL DATA:  Abnormal stress test with inferior wall ischemia  EXAM: Cardiac/Coronary  CTA  TECHNIQUE: The patient was scanned on a [employer].  FINDINGS: A 120 kV prospective scan was triggered in the descending thoracic aorta at 111 HU's. Axial non-contrast 3 mm slices were  carried out through the heart. The data set was analyzed on a dedicated work station and scored using the Agatson method. Gantry rotation speed was 250 msecs and collimation was .6 mm. No beta blockade and 0.8 mg of sl NTG was given. The 3D data set was reconstructed in 5% intervals of the 67-82 % of the R-R cycle. Diastolic phases were analyzed on a dedicated work station using MPR, MIP and VRT modes. The patient received 80 cc of contrast.  Aorta:  Normal size.  No calcifications.  No dissection.  Aortic Valve:  Trileaflet. Mild calcifications.  Coronary Arteries:  Normal coronary origin.  Right dominance.  RCA is a large dominant artery that gives rise to PDA and PLA. In the proximal portion of this artery there is calcified plaque.  Quality of study is poor with a lot of noise plus motion artifact I can not determine the degree of stenosis in this area. Mid portion of this artery also has a calcified plaque that can not be properly evaluated.  Left main is a large artery that gives rise to LAD and LCX arteries.  LAD is a large vessel that has no plaque. There is a stent present in the proximal portion of this artery that is patent.  LCX is a non-dominant artery that gives rise to one large OM1 branch. This artery can not be properly evaluated secondary to motion artifact.  Other findings:  Normal pulmonary vein drainage into the left atrium.  Normal left atrial appendage without a thrombus.  Normal size of the pulmonary artery.  IMPRESSION: 1. Coronary calcium score can not be calculated secondary to the presence of the stent in LAD  2. Normal coronary origin with right dominance.  3. CAD-RADS N Non-diagnostic study. Obstructive CAD can't be excluded. Alternative evaluation is recommended.   Electronically Signed By: Gypsy Balsam M.D. On: 05/13/2021 11:12  Narrative EXAM: OVER-READ INTERPRETATION  CT CHEST  The following report is an over-read  performed by radiologist Dr. Allegra Lai of [facility] on 05/11/2021. This over-read does not include interpretation of cardiac or coronary anatomy or pathology. The coronary calcium score/coronary CTA interpretation by the cardiologist is attached.  COMPARISON:  CT chest, abdomen and pelvis dated July 30, 2018  FINDINGS: Vascular: Normal heart size. No pericardial effusion. Normal caliber thoracic aorta with no atherosclerotic disease. No suspicious filling defects of the central pulmonary arteries.  Mediastinum/Nodes: Esophagus is unremarkable. No pathologically enlarged lymph nodes seen in the chest.  Lungs/Pleura: Central airways are patent. No consolidation, pleural effusion or pneumothorax. Right middle lobe solid pulmonary nodule measuring 5 mm in mean diameter on series 11, image 6, stable when compared with July 30, 2018 prior and favored to be benign.  Upper Abdomen: No acute abnormality.  Musculoskeletal: No chest wall mass or suspicious bone lesions identified.  IMPRESSION: No acute extracardiac abnormality.  Electronically Signed: By: Allegra Lai M.D. On: 05/11/2021 09:52          Risk Assessment/Calculations:   {Does this patient have ATRIAL FIBRILLATION?:[phone]} No BP recorded.  {Refresh Note OR Click here to enter BP  :1}***       Physical Exam:   VS:  There were no vitals taken for this visit.   Wt Readings from Last 3 Encounters:  01/31/23 275 lb (124.7 kg)  12/27/22 270 lb (122.5 kg)  11/29/22 264 lb (119.7 kg)    GEN: Well nourished, well developed in no acute distress NECK: No JVD; No carotid bruits CARDIAC: ***RRR, no murmurs, rubs, gallops RESPIRATORY:  Clear to auscultation without rales, wheezing or rhonchi  ABDOMEN: Soft, non-tender, non-distended EXTREMITIES:  No edema; No deformity   ASSESSMENT AND PLAN: .   ***    {Are you ordering a CV Procedure (e.g. stress test, cath, DCCV, TEE, etc)?   Press F2         :[id number]}  Dispo: ***  Signed, Flossie Dibble, NP

## 2023-02-08 ENCOUNTER — Other Ambulatory Visit: Payer: Self-pay

## 2023-02-08 ENCOUNTER — Ambulatory Visit: Payer: Commercial Managed Care - PPO | Admitting: Cardiology

## 2023-02-08 ENCOUNTER — Encounter: Payer: Self-pay | Admitting: Cardiology

## 2023-02-08 ENCOUNTER — Ambulatory Visit: Payer: Commercial Managed Care - PPO | Attending: Cardiology | Admitting: Cardiology

## 2023-02-08 ENCOUNTER — Other Ambulatory Visit (HOSPITAL_COMMUNITY): Payer: Self-pay

## 2023-02-08 VITALS — BP 110/82 | HR 77 | Ht 71.0 in | Wt 283.6 lb

## 2023-02-08 DIAGNOSIS — G4733 Obstructive sleep apnea (adult) (pediatric): Secondary | ICD-10-CM | POA: Diagnosis not present

## 2023-02-08 DIAGNOSIS — I2510 Atherosclerotic heart disease of native coronary artery without angina pectoris: Secondary | ICD-10-CM

## 2023-02-08 DIAGNOSIS — I25110 Atherosclerotic heart disease of native coronary artery with unstable angina pectoris: Secondary | ICD-10-CM | POA: Diagnosis not present

## 2023-02-08 DIAGNOSIS — I10 Essential (primary) hypertension: Secondary | ICD-10-CM

## 2023-02-08 DIAGNOSIS — E782 Mixed hyperlipidemia: Secondary | ICD-10-CM | POA: Diagnosis not present

## 2023-02-08 DIAGNOSIS — E785 Hyperlipidemia, unspecified: Secondary | ICD-10-CM

## 2023-02-08 MED ORDER — SEMAGLUTIDE-WEIGHT MANAGEMENT 1.7 MG/0.75ML ~~LOC~~ SOAJ
1.7000 mg | SUBCUTANEOUS | 0 refills | Status: DC
  Filled 2023-02-08: qty 3, 28d supply, fill #0

## 2023-02-08 MED ORDER — AMLODIPINE BESYLATE 5 MG PO TABS
5.0000 mg | ORAL_TABLET | Freq: Every day | ORAL | 3 refills | Status: DC
  Filled 2023-02-08 – 2023-05-10 (×2): qty 90, 90d supply, fill #0
  Filled 2023-08-11: qty 90, 90d supply, fill #1
  Filled 2023-11-02: qty 90, 90d supply, fill #2
  Filled 2024-02-04: qty 90, 90d supply, fill #3

## 2023-02-08 MED ORDER — SEMAGLUTIDE-WEIGHT MANAGEMENT 1 MG/0.5ML ~~LOC~~ SOAJ
1.0000 mg | SUBCUTANEOUS | 0 refills | Status: DC
  Filled 2023-02-08: qty 2, 28d supply, fill #0

## 2023-02-08 MED ORDER — CLOPIDOGREL BISULFATE 75 MG PO TABS
75.0000 mg | ORAL_TABLET | Freq: Every day | ORAL | 3 refills | Status: DC
  Filled 2023-02-08 – 2023-05-10 (×2): qty 90, 90d supply, fill #0
  Filled 2023-08-11: qty 90, 90d supply, fill #1
  Filled 2023-11-02: qty 90, 90d supply, fill #2
  Filled 2024-02-04: qty 90, 90d supply, fill #3

## 2023-02-08 MED ORDER — SEMAGLUTIDE-WEIGHT MANAGEMENT 0.25 MG/0.5ML ~~LOC~~ SOAJ
0.2500 mg | SUBCUTANEOUS | 0 refills | Status: AC
  Filled 2023-02-08: qty 2, 28d supply, fill #0

## 2023-02-08 MED ORDER — ATORVASTATIN CALCIUM 80 MG PO TABS
80.0000 mg | ORAL_TABLET | Freq: Every day | ORAL | 3 refills | Status: DC
  Filled 2023-02-08 – 2023-05-10 (×2): qty 90, 90d supply, fill #0
  Filled 2023-08-11: qty 90, 90d supply, fill #1
  Filled 2023-11-02: qty 90, 90d supply, fill #2
  Filled 2024-02-04: qty 90, 90d supply, fill #3

## 2023-02-08 MED ORDER — WEGOVY 0.25 MG/0.5ML ~~LOC~~ SOAJ: 0.2500 mg | SUBCUTANEOUS | 0 refills | Status: DC

## 2023-02-08 MED ORDER — SEMAGLUTIDE-WEIGHT MANAGEMENT 2.4 MG/0.75ML ~~LOC~~ SOAJ
2.4000 mg | SUBCUTANEOUS | 0 refills | Status: DC
  Filled 2023-02-08: qty 3, 28d supply, fill #0

## 2023-02-08 MED ORDER — SEMAGLUTIDE-WEIGHT MANAGEMENT 0.5 MG/0.5ML ~~LOC~~ SOAJ
0.5000 mg | SUBCUTANEOUS | 0 refills | Status: AC
  Filled 2023-02-08: qty 2, 28d supply, fill #0

## 2023-02-08 NOTE — Progress Notes (Signed)
Cardiology Office Note:  .   Date:  02/08/2023  ID:  Wayne Jordan, DOB August 15, 1960, MRN [id number] PCP: Wayne Fendt, NP  [facility] Providers Cardiologist:  Wayne Balsam, MD    History of Present Illness: .   Wayne Jordan is a 63 y.o. male with a past medical history of CAD s/p DES to the LAD 2014, hypertension, OSA on CPAP, dyslipidemia, obesity.  06/28/2022 left heart cath mild nonobstructive CAD with widely patent proximal LAD stent 04/22/2021 Lexiscan intermediate risk due to mild ischemia involving basal, mid, apical portion of the inferior wall 07/25/2018 echo EF 60 to 65%, mildly increased LV wall thickness, impaired relaxation 2014 s/p DES to LAD in the setting of a non-STEMI  Most recently evaluated in June 2024 where he was experiencing some episodes of chest pain that had mixed features.  Decision was made to send him for left heart cath which revealed widely patent proximal LAD stent.  He presents today for follow-up of his CAD.  He is stable from a cardiac perspective, exercises playing pickle ball without repeat episodes of chest pain.  His blood pressure is very well-controlled as his cholesterol.  He is still struggling to lose weight in spite of dietary changes and increasing his physical activity.  We have tried to get him approved for [facility] to help reduce future incidence of MACE however, it has been cost prohibitive.    ROS: Review of Systems  All other systems reviewed and are negative.    Studies Reviewed: .        Cardiac Studies & Procedures   CARDIAC CATHETERIZATION  CARDIAC CATHETERIZATION 06/28/2022  Narrative   Prox RCA lesion is 10% stenosed.   Dist RCA lesion is 10% stenosed.   Mid LAD lesion is 30% stenosed.   Previously placed Prox LAD to Mid LAD stent of unknown type is  widely patent.   The left ventricular systolic function is normal.   Patient has been maintained on aspirin/clopidogrel since his initial PCI.  Mild  nonobstructive CAD with widely patent proximal LAD stent with smooth 30% eccentric narrowing medially beyond the stented segment; dual ramus intermediate vessels and moderate size left circumflex coronary artery which are normal; mild coronary calcification of the proximal to mid RCA with mild luminal narrowing of 10% proximally and distally with a dominant RCA.  Normal LV function with EF estimated approximately 55 to 60% without focal segmental wall motion abnormality.  LVEDP is increased at 22 mm Hg.  RECOMMNEDATION: Patient has been maintained on dual antiplatelet therapy since his intervention.  Medical therapy for mild concomitant CAD.  Continue aggressive lipid-lowering therapy with target LDL less than 70.  Findings Coronary Findings Diagnostic  Dominance: Right  Left Anterior Descending Previously placed Prox LAD to Mid LAD stent of unknown type is  widely patent. Mid LAD lesion is 30% stenosed. The lesion was previously treated .  Third Septal Branch Vessel is small in size.  Left Circumflex  Fourth Obtuse Marginal Branch Vessel is small in size.  Right Coronary Artery Prox RCA lesion is 10% stenosed. Dist RCA lesion is 10% stenosed.  Intervention  No interventions have been documented.   STRESS TESTS  MYOCARDIAL PERFUSION IMAGING 04/22/2021  Narrative   Findings are consistent with mild ischemia involving basal, mid and apical portion of the inferior wall. The study is low risk.   The study is intermediate risk.   No ST deviation was noted.   Left ventricular function is normal.  Nuclear stress EF: 67 %. The left ventricular ejection fraction is hyperdynamic (>65%). End diastolic cavity size is normal.   Prior study available for comparison from 12/12/2018.  ECHOCARDIOGRAM  ECHOCARDIOGRAM COMPLETE 07/25/2018  Narrative ECHOCARDIOGRAM REPORT    Patient Name:   Wayne Jordan Date of Exam: 07/25/2018 Medical Rec #:  [id number]      Height:       71.0 in Accession  #:    [id number]     Weight:       284.8 lb Date of Birth:  Jun 16, 1960       BSA:          2.45 m Patient Age:    57 years       BP:           114/70 mmHg Patient Gender: M              HR:           55 bpm. Exam Location:  [place]   Procedure: 2D Echo  Indications:    CAD  History:        Patient has no prior history of Echocardiogram examinations. Previous Myocardial Infarction and CAD Risk Factors: Dyslipidemia.  Sonographer:    Sinda Du RDCS (AE) Referring Phys: [phone] Georgeanna Lea   Sonographer Comments: Technically challenging study due to limited acoustic windows and patient is morbidly obese. IMPRESSIONS   1. The left ventricle has normal systolic function with an ejection fraction of 60-65%. The cavity size was normal. There is mildly increased left ventricular wall thickness. Left ventricular diastolic Doppler parameters are consistent with impaired relaxation. 2. The right ventricle has normal systolic function. The cavity was normal. There is no increase in right ventricular wall thickness.  FINDINGS Left Ventricle: The left ventricle has normal systolic function, with an ejection fraction of 60-65%. The cavity size was normal. There is mildly increased left ventricular wall thickness. Left ventricular diastolic Doppler parameters are consistent with impaired relaxation. Definity contrast agent was given IV to delineate the left ventricular endocardial borders.  Right Ventricle: The right ventricle has normal systolic function. The cavity was normal. There is no increase in right ventricular wall thickness.  Left Atrium: Left atrial size was normal in size.  Right Atrium: Right atrial size was normal in size. Right atrial pressure is estimated at 3 mmHg.  Interatrial Septum: No atrial level shunt detected by color flow Doppler.  Pericardium: There is no evidence of pericardial effusion.  Mitral Valve: The mitral valve is normal in structure. Mitral  valve regurgitation was not assessed by color flow Doppler.  Tricuspid Valve: The tricuspid valve is normal in structure. Tricuspid valve regurgitation is trivial by color flow Doppler.  Aortic Valve: The aortic valve is normal in structure. Aortic valve regurgitation is mild by color flow Doppler.  Pulmonic Valve: The pulmonic valve was normal in structure. Pulmonic valve regurgitation was not assessed by color flow Doppler.  Venous: The inferior vena cava measures 1.71 cm, is normal in size with greater than 50% respiratory variability.   +--------------+--------++ LEFT VENTRICLE         +----------------+---------++ +--------------+--------++ Diastology                PLAX 2D                +----------------+---------++ +--------------+--------++ LV e' lateral:  8.16 cm/s LVIDd:        5.16 cm  +----------------+---------++ +--------------+--------++ LV E/e' lateral:6.2  LVIDs:        3.01 cm  +----------------+---------++ +--------------+--------++ LV e' medial:   7.83 cm/s LV PW:        1.22 cm  +----------------+---------++ +--------------+--------++ LV E/e' medial: 6.5       LV IVS:       1.25 cm  +----------------+---------++ +--------------+--------++ LVOT diam:    2.10 cm  +--------------+--------++ LV SV:        92 ml    +--------------+--------++ LV SV Index:  35.38    +--------------+--------++ LVOT Area:    3.46 cm +--------------+--------++                        +--------------+--------++  +---------------+---------++ RIGHT VENTRICLE          +---------------+---------++ RV S prime:    7.40 cm/s +---------------+---------++ TAPSE (M-mode):2.3 cm    +---------------+---------++  +---------------+-------++-----------++ LEFT ATRIUM           Index       +---------------+-------++-----------++ LA diam:       3.50 cm1.43 cm/m   +---------------+-------++-----------++ LA Vol (A2C):  34.5 ml14.08 ml/m +---------------+-------++-----------++ LA Vol (A4C):  23.2 ml9.47 ml/m  +---------------+-------++-----------++ LA Biplane Vol:28.4 ml11.59 ml/m +---------------+-------++-----------++ +------------+---------++----------++ RIGHT ATRIUM         Index      +------------+---------++----------++ RA Area:    10.80 cm           +------------+---------++----------++ RA Volume:  22.10 ml 9.02 ml/m +------------+---------++----------++ +------------+-----------++ AORTIC VALVE            +------------+-----------++ LVOT Vmax:  83.60 cm/s  +------------+-----------++ LVOT Vmean: 57.400 cm/s +------------+-----------++ LVOT VTI:   0.201 m     +------------+-----------++ AR PHT:     366 msec    +------------+-----------++  +-------------+-------++ AORTA                +-------------+-------++ Ao Root diam:2.80 cm +-------------+-------++ Ao Asc diam: 3.60 cm +-------------+-------++  +--------------+----------++ MITRAL VALVE             +--------------+-------+ +--------------+----------++ SHUNTS                MV Area (PHT):3.17 cm   +--------------+-------+ +--------------+----------++ Systemic VTI: 0.20 m  MV PHT:       69.31 msec +--------------+-------+ +--------------+----------++ Systemic Diam:2.10 cm MV Decel Time:239 msec   +--------------+-------+ +--------------+----------++ +--------------+----------++ MV E velocity:50.60 cm/s +--------------+----------++ MV A velocity:72.40 cm/s +--------------+----------++ MV E/A ratio: 0.70       +--------------+----------++  +---------+-------+ IVC              +---------+-------+ IVC diam:1.71 cm +---------+-------+   Wayne Balsam MD Electronically signed by Wayne Balsam MD Signature Date/Time: 07/26/2018/12:36:02  PM    Final    CT SCANS  CT CORONARY MORPH W/CTA COR W/SCORE 05/11/2021  Addendum 05/13/2021 11:15 AM ADDENDUM REPORT: 05/13/2021 11:12  CLINICAL DATA:  Abnormal stress test with inferior wall ischemia  EXAM: Cardiac/Coronary  CTA  TECHNIQUE: The patient was scanned on a [employer].  FINDINGS: A 120 kV prospective scan was triggered in the descending thoracic aorta at 111 HU's. Axial non-contrast 3 mm slices were carried out through the heart. The data set was analyzed on a dedicated work station and scored using the Agatson method. Gantry rotation speed was 250 msecs and collimation was .6 mm. No beta blockade and 0.8 mg of sl NTG was given. The 3D data set was reconstructed in 5% intervals of the 67-82 % of the R-R cycle.  Diastolic phases were analyzed on a dedicated work station using MPR, MIP and VRT modes. The patient received 80 cc of contrast.  Aorta:  Normal size.  No calcifications.  No dissection.  Aortic Valve:  Trileaflet. Mild calcifications.  Coronary Arteries:  Normal coronary origin.  Right dominance.  RCA is a large dominant artery that gives rise to PDA and PLA. In the proximal portion of this artery there is calcified plaque. Quality of study is poor with a lot of noise plus motion artifact I can not determine the degree of stenosis in this area. Mid portion of this artery also has a calcified plaque that can not be properly evaluated.  Left main is a large artery that gives rise to LAD and LCX arteries.  LAD is a large vessel that has no plaque. There is a stent present in the proximal portion of this artery that is patent.  LCX is a non-dominant artery that gives rise to one large OM1 branch. This artery can not be properly evaluated secondary to motion artifact.  Other findings:  Normal pulmonary vein drainage into the left atrium.  Normal left atrial appendage without a thrombus.  Normal size of the pulmonary  artery.  IMPRESSION: 1. Coronary calcium score can not be calculated secondary to the presence of the stent in LAD  2. Normal coronary origin with right dominance.  3. CAD-RADS N Non-diagnostic study. Obstructive CAD can't be excluded. Alternative evaluation is recommended.   Electronically Signed By: Wayne Jordan M.D. On: 05/13/2021 11:12  Narrative EXAM: OVER-READ INTERPRETATION  CT CHEST  The following report is an over-read performed by radiologist Dr. Allegra Lai of [facility] on 05/11/2021. This over-read does not include interpretation of cardiac or coronary anatomy or pathology. The coronary calcium score/coronary CTA interpretation by the cardiologist is attached.  COMPARISON:  CT chest, abdomen and pelvis dated July 30, 2018  FINDINGS: Vascular: Normal heart size. No pericardial effusion. Normal caliber thoracic aorta with no atherosclerotic disease. No suspicious filling defects of the central pulmonary arteries.  Mediastinum/Nodes: Esophagus is unremarkable. No pathologically enlarged lymph nodes seen in the chest.  Lungs/Pleura: Central airways are patent. No consolidation, pleural effusion or pneumothorax. Right middle lobe solid pulmonary nodule measuring 5 mm in mean diameter on series 11, image 6, stable when compared with July 30, 2018 prior and favored to be benign.  Upper Abdomen: No acute abnormality.  Musculoskeletal: No chest wall mass or suspicious bone lesions identified.  IMPRESSION: No acute extracardiac abnormality.  Electronically Signed: By: Allegra Lai M.D. On: 05/11/2021 09:52          Risk Assessment/Calculations:             Physical Exam:   VS:  BP 110/82 (BP Location: Right Arm, Patient Position: Sitting)   Pulse 77   Ht 5\' 11"  (1.803 m)   Wt 283 lb 9.6 oz (128.6 kg)   SpO2 93%   BMI 39.55 kg/m    Wt Readings from Last 3 Encounters:  02/08/23 283 lb 9.6 oz (128.6 kg)  01/31/23 275 lb  (124.7 kg)  12/27/22 270 lb (122.5 kg)    GEN: Well nourished, well developed in no acute distress NECK: No JVD; No carotid bruits CARDIAC: RRR, no murmurs, rubs, gallops RESPIRATORY:  Clear to auscultation without rales, wheezing or rhonchi  ABDOMEN: Soft, non-tender, non-distended EXTREMITIES:  No edema; No deformity   ASSESSMENT AND PLAN: .   CAD - s/p DES to the LAD 2014 >>  LHC 2024 mild nonobstructive CAD with widely patent proximal LAD stent.  Continue aspirin 81 mg daily, continue Plavix 75 mg daily, continue Lipitor 80 mg daily, continue nitroglycerin as needed--has not needed stable with no anginal symptoms. No indication for ischemic evaluation.  Start Wegovy to help prevent future MACE.   Hypertension-blood pressure is very well-controlled at 110/82, continue Norvasc 5 mg daily.  Dyslipidemia-LDL is very well-controlled at 75, checked by his PCP on 01/31/2023.  OSA on CPAP-compliant.  Obesity-BMI 39, he is try to make dietary changes, we have tried to get [facility] covered both for CAD and weight loss, however it is cost prohibitive.         Dispo: Start Wegovy>> if insurance will cover, he will come back in 3 months for weight evaluation. If not, he will follow up in 1 year.   Signed, Flossie Dibble, NP

## 2023-02-08 NOTE — Patient Instructions (Addendum)
Medication Instructions:   Wegovy 0.25 mg inject into the skin once a week x 4 weeks    Lab Work: None Ordered If you have labs (blood work) drawn today and your tests are completely normal, you will receive your results only by: [employer] (if you have MyChart) OR A paper copy in the mail If you have any lab test that is abnormal or we need to change your treatment, we will call you to review the results.   Testing/Procedures: None Ordered   Follow-Up: At [facility], you and your health needs are our priority.  As part of our continuing mission to provide you with exceptional heart care, we have created designated Provider Care Teams.  These Care Teams include your primary Cardiologist (physician) and Advanced Practice Providers (APPs -  Physician Assistants and Nurse Practitioners) who all work together to provide you with the care you need, when you need it.  We recommend signing up for the patient portal called "MyChart".  Sign up information is provided on this After Visit Summary.  MyChart is used to connect with patients for Virtual Visits (Telemedicine).  Patients are able to view lab/test results, encounter notes, upcoming appointments, etc.  Non-urgent messages can be sent to your provider as well.   To learn more about what you can do with MyChart, go to [email or web address].    Your next appointment:   12 month(s) with Wallis Bamberg, NP or Dr. Bing Matter  The format for your next appointment:   In Person  Provider:   Wallis Bamberg, NP   Other Instructions NA

## 2023-03-07 ENCOUNTER — Ambulatory Visit (INDEPENDENT_AMBULATORY_CARE_PROVIDER_SITE_OTHER): Payer: Commercial Managed Care - PPO | Admitting: Family Medicine

## 2023-03-14 ENCOUNTER — Telehealth: Payer: Self-pay

## 2023-03-14 DIAGNOSIS — I25110 Atherosclerotic heart disease of native coronary artery with unstable angina pectoris: Secondary | ICD-10-CM

## 2023-03-14 DIAGNOSIS — E66812 Obesity, class 2: Secondary | ICD-10-CM

## 2023-03-14 MED ORDER — WEGOVY 0.25 MG/0.5ML ~~LOC~~ SOAJ: 0.2500 mg | SUBCUTANEOUS | Status: DC

## 2023-03-14 NOTE — Telephone Encounter (Signed)
 Wegovy sample given

## 2023-03-21 ENCOUNTER — Ambulatory Visit (INDEPENDENT_AMBULATORY_CARE_PROVIDER_SITE_OTHER): Payer: Commercial Managed Care - PPO | Admitting: Family Medicine

## 2023-03-21 ENCOUNTER — Other Ambulatory Visit (HOSPITAL_COMMUNITY): Payer: Self-pay

## 2023-03-21 ENCOUNTER — Encounter (INDEPENDENT_AMBULATORY_CARE_PROVIDER_SITE_OTHER): Payer: Self-pay | Admitting: Family Medicine

## 2023-03-21 VITALS — BP 135/72 | HR 89 | Temp 98.1°F | Ht 71.0 in | Wt 284.0 lb

## 2023-03-21 DIAGNOSIS — I10 Essential (primary) hypertension: Secondary | ICD-10-CM

## 2023-03-21 DIAGNOSIS — E559 Vitamin D deficiency, unspecified: Secondary | ICD-10-CM | POA: Diagnosis not present

## 2023-03-21 DIAGNOSIS — I2510 Atherosclerotic heart disease of native coronary artery without angina pectoris: Secondary | ICD-10-CM | POA: Diagnosis not present

## 2023-03-21 DIAGNOSIS — E7849 Other hyperlipidemia: Secondary | ICD-10-CM

## 2023-03-21 DIAGNOSIS — R7303 Prediabetes: Secondary | ICD-10-CM

## 2023-03-21 DIAGNOSIS — E538 Deficiency of other specified B group vitamins: Secondary | ICD-10-CM | POA: Diagnosis not present

## 2023-03-21 DIAGNOSIS — Z6839 Body mass index (BMI) 39.0-39.9, adult: Secondary | ICD-10-CM | POA: Diagnosis not present

## 2023-03-21 MED ORDER — VITAMIN D (ERGOCALCIFEROL) 1.25 MG (50000 UNIT) PO CAPS
50000.0000 [IU] | ORAL_CAPSULE | ORAL | 0 refills | Status: DC
  Filled 2023-03-21: qty 4, 28d supply, fill #0

## 2023-03-21 MED ORDER — CYANOCOBALAMIN 500 MCG PO TABS: 500.0000 ug | ORAL_TABLET | Freq: Every day | ORAL | Status: DC

## 2023-03-21 NOTE — Progress Notes (Signed)
 Wayne Jordan, D.O.  ABFM, ABOM Specializing in Clinical Bariatric Medicine  Office located at: [address] [place], [place]  [address]   Assessment and Plan:   FOR THE DISEASE OF OBESITY: BMI 39.0-39.9,adult - Current BMI 39.63 Morbid obesity (HCC)-start bmi 39.33 Assessment & Plan: Since last office visit on 01/31/23 patient's muscle mass has increased by 3.6 lb. Fat mass has increased by 4.6lb. Total body water has increased by 5 lb.  Counseling done on how various foods will affect these numbers and how to maximize success  Total lbs lost to date: +2 lbs Total weight loss percentage to date: +0.71 %   Recommended Dietary Goals Wayne Jordan is currently in the action stage of change. As such, his goal is to continue weight management plan.  He has agreed to: Continue with CAT 3 MP with slight modififications to meal plan parameters -- Journal 1700-1800 calories and 150+ g of protein.   Behavioral Intervention We discussed the following today: increasing lean protein intake to established goals, avoiding skipping meals, keeping healthy foods at home, identifying sources and decreasing liquid calories, decreasing eating out or consumption of processed foods, and making healthy choices when eating convenient foods, and avoiding temptations and identifying enticing environmental cues  Additional resources provided today: None  Evidence-based interventions for health behavior change were utilized today including the discussion of self monitoring techniques, problem-solving barriers and SMART goal setting techniques.   Regarding patient's less desirable eating habits and patterns, we employed the technique of small changes.   Pt will specifically work on: Journal everything he eats and stay away from caloric beverages (sodas) for next visit.    Recommended Physical Activity Goals Wayne Jordan has been advised to work up to 150 minutes of moderate intensity aerobic activity a week and  strengthening exercises 2-3 times per week for cardiovascular health, weight loss maintenance and preservation of muscle mass.   He has agreed to :  Think about enjoyable ways to increase daily physical activity and overcoming barriers to exercise and Increase physical activity in their day and reduce sedentary time (increase NEAT).   Pharmacotherapy We both agreed to: continue with nutritional and behavioral strategies and pt plans to start ozempic at the direction of cardiology care team (see below)   FOR ASSOCIATED CONDITIONS ADDRESSED TODAY: Prediabetes Assessment & Plan: Lab Results  Component Value Date   HGBA1C 5.7 (H) 01/31/2023   HGBA1C 5.5 09/13/2022   HGBA1C 5.6 02/09/2022   INSULIN 21.6 01/31/2023   INSULIN 23.2 09/13/2022   INSULIN 20.5 02/09/2022   Most recent A1C has worsened from 5.5 to 5.7 as of 01/31/23. Pt reports he obtained Semaglutide 0.25 mg once weekly under the direction of Wayne Bamberg, NP of cardiology who provided pt with free samples. He plans to take his first dose of Semaglutide tomorrow. Encouraged pt to decrease simple sugars/cards, reduce consumption of sugary beverages, and prioritize protein intake. Discussed how eating off plan may result in GI issues while taking Semaglutide. Advised pt to drink sufficient amounts of water on a daily basis.    Vitamin D deficiency Assessment & Plan: Lab Results  Component Value Date   VD25OH 54.0 01/31/2023   VD25OH 40.3 09/13/2022   VD25OH 76.4 02/09/2022   Most recent Vit D was at goal. Pt is taking OTC Vit D 5,000 IUs daily and ERGO 50,000 once weekly. Tolerating both supplements well with no adverse side effects reported.   Continue with current supplementation as prescribed. Will refill ERGO  today with no dose changes. Will continue to monitor condition as it relates to his weight loss.   Orders: - Refill ERGO, no dose changes   Coronary artery disease, unspecified vessel or lesion type, unspecified  whether angina present, unspecified whether native or transplanted heart Other hyperlipidemia Assessment & Plan: Lab Results  Component Value Date   CHOL 112 01/31/2023   HDL 40 01/31/2023   LDLCALC 36 01/31/2023   TRIG 229 (H) 01/31/2023   CHOLHDL 2.8 01/31/2023   Most recent lipid panel revealed his triglycerides are elevated at 229. LDL is at goal at 36. Pt is on Atorvastatin 80 mg once daily. Good compliance and tolerance reported. No adverse side effects reported. No acute concerns today.   Follow heart healthy diet per nutritional meal plan by decreasing simple carbs/sugars, avoiding saturated/trans fats, and avoiding fatty meats and butters. Continue with current statin therapy. Will continue to monitor condition.    Essential hypertension Assessment & Plan: BP Readings from Last 3 Encounters:  03/21/23 135/72  02/08/23 110/82  01/31/23 138/79   BP is at high end of goal today. He is currently on Amlodipine 5 mg once daily. Tolerating well with no side effects reported. He is established with Wayne Bamberg NP of cardiology.   Continue with current antihypertensive treatment as directed by cardiology. Follow up with her cardiology care team as instructed by them. Encouraged pt to follow a heart healthy diet via his nutritional meal plan.    B12 deficiency due to diet Assessment & Plan: Lab Results  Component Value Date   VITAMINB12 406 01/31/2023   Most recent B12 is sub-optimal at 406. Pt takes a multivitamin daily, but is not sure it contains B12.   Reviewed ideal B12 levels of 500 and above as well as the benefits of optimal levels of B12 such as improved energy, moods, and overall health. Discussed the risk/benefits of starting B12. Pt is agreeable to starting OTC B12 500 mcg once daily. Will continue to monitor levels in 3-4 months from last checked to keep levels within normal limits and prevent over supplementation.  Orders: - Start B12 500 mcg daily    Follow  up:   Return in about 4 weeks (around 04/18/2023) for 1 month f/u. He was informed of the importance of frequent follow up visits to maximize his success with intensive lifestyle modifications for his multiple health conditions.  Subjective:   Chief complaint: Obesity Latroy is here to discuss his progress with his obesity treatment plan. He is on the Category 4 Plan and keeping a food journal and adhering to recommended goals of 1700-1800 calories and 130+ g of protein and states he is following his eating plan approximately 50% of the time. He states he is doing cardio/weights 35-40 minutes 3 days per week.  Interval History:  Wayne Jordan is here for a follow up office visit. Since last OV on 01/31/23, he is up 9 lbs. He reports he has eaten off plan when going to 3 different birthday parties of his son, daughter, wife in the past 2 weeks. He has been fasting 1 day per week, stating this has been usual for him. He has been eating 110-115 grams of protein daily per his journal. He does admit to drinking 1-2 regular Cheerwines per day.   Pharmacotherapy for weight loss: He is currently taking no anti-obesity medication.   Review of Systems:  Pertinent positives were addressed with patient today.  Reviewed by clinician on day of visit:  allergies, medications, problem list, medical history, surgical history, family history, social history, and previous encounter notes.  Weight Summary and Biometrics   Weight Lost Since Last Visit: 0  Weight Gained Since Last Visit: 9 lb   Vitals Temp: 98.1 F (36.7 C) BP: 135/72 Pulse Rate: 89 SpO2: 97 %   Anthropometric Measurements Height: 5\' 11"  (1.803 m) Weight: 284 lb (128.8 kg) BMI (Calculated): 39.63 Weight at Last Visit: 275 lb Weight Lost Since Last Visit: 0 Weight Gained Since Last Visit: 9 lb Starting Weight: 282 lb Total Weight Loss (lbs): 0 lb (0 kg) Peak Weight: 290 lb   Body Composition  Body Fat %: 35.7 % Fat Mass (lbs):  101.4 lbs Muscle Mass (lbs): 173.8 lbs Total Body Water (lbs): 126.2 lbs Visceral Fat Rating : 23   Other Clinical Data Fasting: No Labs: No Today's Visit #: 23 Starting Date: 05/21/21    Objective:   PHYSICAL EXAM: Blood pressure 135/72, pulse 89, temperature 98.1 F (36.7 C), height 5\' 11"  (1.803 m), weight 284 lb (128.8 kg), SpO2 97%. Body mass index is 39.61 kg/m.  General: he is overweight, cooperative and in no acute distress. PSYCH: Has normal mood, affect and thought process.   HEENT: EOMI, sclerae are anicteric. Lungs: Normal breathing effort, no conversational dyspnea. Extremities: Moves * 4 Neurologic: A and O * 3, good insight  DIAGNOSTIC DATA REVIEWED: BMET    Component Value Date/Time   NA 143 09/13/2022 0959   K 4.2 09/13/2022 0959   CL 105 09/13/2022 0959   CO2 21 09/13/2022 0959   GLUCOSE 99 09/13/2022 0959   GLUCOSE 107 (H) 10/17/2020 1200   BUN 12 09/13/2022 0959   CREATININE 1.03 09/13/2022 0959   CALCIUM 9.8 09/13/2022 0959   GFRNONAA >60 10/17/2020 1200   GFRAA >60 01/08/2019 1145   Lab Results  Component Value Date   HGBA1C 5.7 (H) 01/31/2023   HGBA1C 5.6 10/07/2012   Lab Results  Component Value Date   INSULIN 21.6 01/31/2023   INSULIN 22.2 05/21/2021   Lab Results  Component Value Date   TSH 2.600 07/13/2021   CBC    Component Value Date/Time   WBC 7.9 06/25/2022 1006   WBC 6.5 10/17/2020 1200   RBC 5.27 06/25/2022 1006   RBC 5.06 10/17/2020 1200   HGB 15.5 06/25/2022 1006   HCT 47.7 06/25/2022 1006   PLT 186 06/25/2022 1006   MCV 91 06/25/2022 1006   MCH 29.4 06/25/2022 1006   MCH 30.2 10/17/2020 1200   MCHC 32.5 06/25/2022 1006   MCHC 32.7 10/17/2020 1200   RDW 12.8 06/25/2022 1006   Iron Studies No results found for: "IRON", "TIBC", "FERRITIN", "IRONPCTSAT" Lipid Panel     Component Value Date/Time   CHOL 112 01/31/2023 1012   CHOL CANCELED 11/07/2014 0921   CHOL SEE BELOW 11/07/2014 0921   CHOL 128  04/26/2014 0757   TRIG 229 (H) 01/31/2023 1012   TRIG CANCELED 11/07/2014 0921   TRIG SEE BELOW 11/07/2014 0921   TRIG 140 04/26/2014 0757   HDL 40 01/31/2023 1012   HDL CANCELED 11/07/2014 0921   HDL SEE BELOW 11/07/2014 0921   HDL 39 (L) 04/26/2014 0757   CHOLHDL 2.8 01/31/2023 1012   CHOLHDL 2.6 04/18/2015 0930   VLDL 22 04/18/2015 0930   LDLCALC 36 01/31/2023 1012   LDLCALC CANCELED 11/07/2014 0921   LDLCALC SEE BELOW 11/07/2014 0921   LDLCALC 61 04/26/2014 0757   Hepatic Function Panel  Component Value Date/Time   PROT 7.0 09/13/2022 0959   ALBUMIN 4.5 09/13/2022 0959   AST 26 09/13/2022 0959   ALT 35 09/13/2022 0959   ALKPHOS 106 09/13/2022 0959   BILITOT 0.8 09/13/2022 0959   BILIDIR 0.19 02/21/2017 0934   IBILI 0.5 11/07/2014 0921      Component Value Date/Time   TSH 2.600 07/13/2021 0938   Nutritional Lab Results  Component Value Date   VD25OH 54.0 01/31/2023   VD25OH 40.3 09/13/2022   VD25OH 76.4 02/09/2022    Attestations:   I, Isabelle Course, acting as a medical scribe for Thomasene Lot, DO., have compiled all relevant documentation for today's office visit on behalf of Thomasene Lot, DO, while in the presence of [employer], DO.  Reviewed by clinician on day of visit: allergies, medications, problem list, medical history, surgical history, family history, social history, and previous encounter notes pertinent to patient's obesity diagnosis.  I have reviewed the above documentation for accuracy and completeness, and I agree with the above. Wayne Jordan, D.O.  The 21st Century Cures Act was signed into law in 2016 which includes the topic of electronic health records.  This provides immediate access to information in MyChart.  This includes consultation notes, operative notes, office notes, lab results and pathology reports.  If you have any questions about what you read please let us know at your next visit so we can discuss your concerns  and take corrective action if need be.  We are right here with you.

## 2023-03-30 ENCOUNTER — Encounter: Payer: Self-pay | Admitting: Cardiology

## 2023-04-08 ENCOUNTER — Telehealth: Payer: Self-pay | Admitting: Cardiology

## 2023-04-08 NOTE — Telephone Encounter (Signed)
 Pt requesting a cb to discuss the purpose of the pharmD referral

## 2023-04-08 NOTE — Telephone Encounter (Signed)
 Advised patient referral was for pharm D for weight management. Pt verbalized understanding and had no additional questions.

## 2023-04-20 ENCOUNTER — Ambulatory Visit (INDEPENDENT_AMBULATORY_CARE_PROVIDER_SITE_OTHER): Admitting: Family Medicine

## 2023-04-20 ENCOUNTER — Encounter (INDEPENDENT_AMBULATORY_CARE_PROVIDER_SITE_OTHER): Payer: Self-pay | Admitting: Family Medicine

## 2023-04-20 VITALS — BP 118/76 | HR 69 | Temp 97.4°F | Ht 71.0 in | Wt 273.0 lb

## 2023-04-20 DIAGNOSIS — Z6838 Body mass index (BMI) 38.0-38.9, adult: Secondary | ICD-10-CM | POA: Diagnosis not present

## 2023-04-20 DIAGNOSIS — R7303 Prediabetes: Secondary | ICD-10-CM | POA: Diagnosis not present

## 2023-04-20 DIAGNOSIS — Z6839 Body mass index (BMI) 39.0-39.9, adult: Secondary | ICD-10-CM

## 2023-04-20 DIAGNOSIS — E559 Vitamin D deficiency, unspecified: Secondary | ICD-10-CM

## 2023-04-20 DIAGNOSIS — I10 Essential (primary) hypertension: Secondary | ICD-10-CM | POA: Diagnosis not present

## 2023-04-20 DIAGNOSIS — E538 Deficiency of other specified B group vitamins: Secondary | ICD-10-CM

## 2023-04-20 NOTE — Progress Notes (Signed)
 Wayne Jordan, D.O.  ABFM, ABOM Specializing in Clinical Bariatric Medicine  Office located at: [address] [place], [place]  [address]   Assessment and Plan:  No orders of the defined types were placed in this encounter.   There are no discontinued medications.   No orders of the defined types were placed in this encounter.  FOR THE DISEASE OF OBESITY:  BMI 38.0-38.9,adult - Current BMI 38.09 Morbid obesity (HCC)-start bmi 39.33 Assessment & Plan: Since last office visit on 03/21/2023, patient's muscle mass has decreased by 4.8 lbs. Fat mass has decreased by 6 lbs. Total body water has decreased by 7 lbs.  Counseling done on how various foods will affect these numbers and how to maximize success  Total lbs lost to date: 9 lbs Total weight loss percentage to date: 3.19%    Recommended Dietary Goals Wayne Jordan is currently in the action stage of change. As such, his goal is to continue weight management plan.  He has agreed to: continue current plan   Behavioral Intervention We discussed the following today: increasing lean protein intake to established goals, decreasing simple carbohydrates , avoiding skipping meals, and work on meal planning and preparation  Additional resources provided today: None  Evidence-based interventions for health behavior change were utilized today including the discussion of self monitoring techniques, problem-solving barriers and SMART goal setting techniques.   Regarding patient's less desirable eating habits and patterns, we employed the technique of small changes.   Pt will specifically work on: n/a   Recommended Physical Activity Goals Wayne Jordan has been advised to work up to 150 minutes of moderate intensity aerobic activity a week and strengthening exercises 2-3 times per week for cardiovascular health, weight loss maintenance and preservation of muscle mass.   He has agreed to :  Continue current level of physical activity     Pharmacotherapy We both agreed to : continue with nutritional and behavioral strategies   FOR ASSOCIATED CONDITIONS ADDRESSED TODAY: Prediabetes Assessment & Plan: Wayne Jordan is taking Wegovy 0.25 mg once weekly. He has been taking this dose for 2 weeks after receiving a sample from cardiology. Pt has appointment next week with RPh to pt can attempt to get medication covered. Additionally, Wayne Jordan has been journaling his food intake. Protein has ranged from 3-113g with a 55g average, and calorie intake has ranged from [phone] cal with a 1500 cal average.   Encouraged pt to continue journaling accurately to continue monitoring proper adherence to MP. Keep scheduled appointment for medication with Cheree Ditto, RPH. Will continue to monitor alongside cardiology.    Vitamin D deficiency Assessment & Plan: Wayne Jordan is taking ERGO 50000 units once weekly. Tolerating well, no SE. No acute concerns today in this regard. Continue supplement at current dose. Will monitor condition closely.    Essential hypertension Assessment & Plan: BP Readings from Last 3 Encounters:  04/20/23 118/76  03/21/23 135/72  02/08/23 110/82  Pt is on Amlodipine 5 mg once daily. Condition managed by Wallis Bamberg NP of cardiology. BP well controlled, currently at goal. Continue medication regimen per cardiology. Will continue to monitor.    B12 deficiency due to diet Assessment & Plan: Wayne Jordan is on Cyanocobalamin 500 mcg once daily. Pt denies any complaints with this supplement. Continue current supplementation regimen. Will recheck levels 3 months from last obtained labs.   Follow up:   Return in about 6 weeks (around 06/01/2023). He was informed of the importance of frequent follow up visits to maximize his  success with intensive lifestyle modifications for his multiple health conditions.  Subjective:   Chief complaint: Obesity Wayne Jordan is here to discuss his progress with his obesity treatment plan. He is on  the Category 4 Plan and keeping a food journal and adhering to recommended goals of 1700-1800 calories and 130+g of protein and states he is following his eating plan approximately 10% of the time. He states he is going to the gym 45-60 minutes 2 days per week.  Interval History:  Wayne Jordan is here for a follow up office visit. Since last OV on 03/21/2023, [name] is down 11 lbs. He has been journaling his food intake. However, pt reports his dad was in the hospital for 3 weeks so he was eating at the cafeteria and from vending machines. Additionally, pt admits to not cutting out caloric beverages.   Pharmacotherapy for weight loss: He is currently taking Wegovy 0.25 mg once weekly.   Review of Systems:  Pertinent positives were addressed with patient today.  Reviewed by clinician on day of visit: allergies, medications, problem list, medical history, surgical history, family history, social history, and previous encounter notes.  Weight Summary and Biometrics   Weight Lost Since Last Visit: 11lb  Weight Gained Since Last Visit: 0   Vitals Temp: (!) 97.4 F (36.3 C) BP: 118/76 Pulse Rate: 69 SpO2: 94 %   Anthropometric Measurements Height: 5\' 11"  (1.803 m) Weight: 273 lb (123.8 kg) BMI (Calculated): 38.09 Weight at Last Visit: 284lb Weight Lost Since Last Visit: 11lb Weight Gained Since Last Visit: 0 Starting Weight: 282lb Total Weight Loss (lbs): 9 lb (4.082 kg) Peak Weight: 290lb   Body Composition  Body Fat %: 34.9 % Fat Mass (lbs): 95.4 lbs Muscle Mass (lbs): 169 lbs Total Body Water (lbs): 119.2 lbs Visceral Fat Rating : 22   Other Clinical Data Fasting: yes Labs: no Today's Visit #: 24 Starting Date: 05/21/21    Objective:   PHYSICAL EXAM: Blood pressure 118/76, pulse 69, temperature (!) 97.4 F (36.3 C), height 5\' 11"  (1.803 m), weight 273 lb (123.8 kg), SpO2 94%. Body mass index is 38.08 kg/m.  General: he is overweight, cooperative and in no  acute distress. PSYCH: Has normal mood, affect and thought process.   HEENT: EOMI, sclerae are anicteric. Lungs: Normal breathing effort, no conversational dyspnea. Extremities: Moves * 4 Neurologic: A and O * 3, good insight  DIAGNOSTIC DATA REVIEWED: BMET    Component Value Date/Time   NA 143 09/13/2022 0959   K 4.2 09/13/2022 0959   CL 105 09/13/2022 0959   CO2 21 09/13/2022 0959   GLUCOSE 99 09/13/2022 0959   GLUCOSE 107 (H) 10/17/2020 1200   BUN 12 09/13/2022 0959   CREATININE 1.03 09/13/2022 0959   CALCIUM 9.8 09/13/2022 0959   GFRNONAA >60 10/17/2020 1200   GFRAA >60 01/08/2019 1145   Lab Results  Component Value Date   HGBA1C 5.7 (H) 01/31/2023   HGBA1C 5.6 10/07/2012   Lab Results  Component Value Date   INSULIN 21.6 01/31/2023   INSULIN 22.2 05/21/2021   Lab Results  Component Value Date   TSH 2.600 07/13/2021   CBC    Component Value Date/Time   WBC 7.9 06/25/2022 1006   WBC 6.5 10/17/2020 1200   RBC 5.27 06/25/2022 1006   RBC 5.06 10/17/2020 1200   HGB 15.5 06/25/2022 1006   HCT 47.7 06/25/2022 1006   PLT 186 06/25/2022 1006   MCV 91 06/25/2022 1006   MCH  29.4 06/25/2022 1006   MCH 30.2 10/17/2020 1200   MCHC 32.5 06/25/2022 1006   MCHC 32.7 10/17/2020 1200   RDW 12.8 06/25/2022 1006   Iron Studies No results found for: "IRON", "TIBC", "FERRITIN", "IRONPCTSAT" Lipid Panel     Component Value Date/Time   CHOL 112 01/31/2023 1012   CHOL CANCELED 11/07/2014 0921   CHOL SEE BELOW 11/07/2014 0921   CHOL 128 04/26/2014 0757   TRIG 229 (H) 01/31/2023 1012   TRIG CANCELED 11/07/2014 0921   TRIG SEE BELOW 11/07/2014 0921   TRIG 140 04/26/2014 0757   HDL 40 01/31/2023 1012   HDL CANCELED 11/07/2014 0921   HDL SEE BELOW 11/07/2014 0921   HDL 39 (L) 04/26/2014 0757   CHOLHDL 2.8 01/31/2023 1012   CHOLHDL 2.6 04/18/2015 0930   VLDL 22 04/18/2015 0930   LDLCALC 36 01/31/2023 1012   LDLCALC CANCELED 11/07/2014 0921   LDLCALC SEE BELOW  11/07/2014 0921   LDLCALC 61 04/26/2014 0757   Hepatic Function Panel     Component Value Date/Time   PROT 7.0 09/13/2022 0959   ALBUMIN 4.5 09/13/2022 0959   AST 26 09/13/2022 0959   ALT 35 09/13/2022 0959   ALKPHOS 106 09/13/2022 0959   BILITOT 0.8 09/13/2022 0959   BILIDIR 0.19 02/21/2017 0934   IBILI 0.5 11/07/2014 0921      Component Value Date/Time   TSH 2.600 07/13/2021 0938   Nutritional Lab Results  Component Value Date   VD25OH 54.0 01/31/2023   VD25OH 40.3 09/13/2022   VD25OH 76.4 02/09/2022    Attestations:   I, Camryn Mix, acting as a Stage manager for [employer], DO., have compiled all relevant documentation for today's office visit on behalf of Thomasene Lot, DO, while in the presence of [employer], DO.  I have reviewed the above documentation for accuracy and completeness, and I agree with the above. Wayne Jordan, D.O.  The 21st Century Cures Act was signed into law in 2016 which includes the topic of electronic health records.  This provides immediate access to information in MyChart.  This includes consultation notes, operative notes, office notes, lab results and pathology reports.  If you have any questions about what you read please let us know at your next visit so we can discuss your concerns and take corrective action if need be.  We are right here with you.

## 2023-04-25 MED ORDER — VITAMIN D (ERGOCALCIFEROL) 1.25 MG (50000 UNIT) PO CAPS
50000.0000 [IU] | ORAL_CAPSULE | ORAL | 0 refills | Status: DC
  Filled 2023-04-25: qty 4, 28d supply, fill #0

## 2023-04-26 ENCOUNTER — Other Ambulatory Visit: Payer: Self-pay

## 2023-04-26 ENCOUNTER — Ambulatory Visit: Attending: Cardiology | Admitting: Pharmacist

## 2023-04-26 ENCOUNTER — Other Ambulatory Visit (HOSPITAL_COMMUNITY): Payer: Self-pay

## 2023-04-26 ENCOUNTER — Encounter: Payer: Self-pay | Admitting: Pharmacist

## 2023-04-26 DIAGNOSIS — E66812 Obesity, class 2: Secondary | ICD-10-CM | POA: Diagnosis not present

## 2023-04-26 DIAGNOSIS — I25110 Atherosclerotic heart disease of native coronary artery with unstable angina pectoris: Secondary | ICD-10-CM

## 2023-04-26 DIAGNOSIS — Z6839 Body mass index (BMI) 39.0-39.9, adult: Secondary | ICD-10-CM | POA: Diagnosis not present

## 2023-04-26 MED ORDER — WEGOVY 0.25 MG/0.5ML ~~LOC~~ SOAJ: 0.2500 mg | SUBCUTANEOUS | Status: DC

## 2023-04-26 NOTE — Patient Instructions (Addendum)
 It was nice meeting you today  I have given you another [facility] sample  We recommend a diet full of vegetables, fruit, lean protein, and whole grains  We recommend at least 30 minutes of physical activity a day  I will try to complete the prior authorization for you for the [facility] again  Laural Golden, PharmD, BCACP, CDCES, CPP [address] [place], [place], [address] Phone: [phone], Fax: [phone]

## 2023-04-26 NOTE — Progress Notes (Signed)
 Patient ID: DRAVYN SEVERS                 DOB: 12-23-1960                    MRN: [id number]     HPI: Wayne Jordan is a 63 y.o. male patient referred to pharmacy clinic by Wallis Bamberg for weight loss visit. PMH significant for MI, CAD, HTN, and obesity.  Most recent BMI 38.09.  Patient presents today to discuss weight loss. Exercises regularly and has changed to a heart healthy diet. Diet now consists mostly of vegetables and chicken or fish.  Tolerating Wegovy 0.25mg  weekly. Has noticed his appetite has been decreasing.   Labs: Lab Results  Component Value Date   HGBA1C 5.7 (H) 01/31/2023    Wt Readings from Last 1 Encounters:  04/20/23 273 lb (123.8 kg)    BP Readings from Last 1 Encounters:  04/20/23 118/76   Pulse Readings from Last 1 Encounters:  04/20/23 69       Component Value Date/Time   CHOL 112 01/31/2023 1012   CHOL CANCELED 11/07/2014 0921   CHOL SEE BELOW 11/07/2014 0921   CHOL 128 04/26/2014 0757   TRIG 229 (H) 01/31/2023 1012   TRIG CANCELED 11/07/2014 0921   TRIG SEE BELOW 11/07/2014 0921   TRIG 140 04/26/2014 0757   HDL 40 01/31/2023 1012   HDL CANCELED 11/07/2014 0921   HDL SEE BELOW 11/07/2014 0921   HDL 39 (L) 04/26/2014 0757   CHOLHDL 2.8 01/31/2023 1012   CHOLHDL 2.6 04/18/2015 0930   VLDL 22 04/18/2015 0930   LDLCALC 36 01/31/2023 1012   LDLCALC CANCELED 11/07/2014 0921   LDLCALC SEE BELOW 11/07/2014 0921   LDLCALC 61 04/26/2014 0757    Past Medical History:  Diagnosis Date   Acute myocardial infarction of other anterior wall, initial episode of care    Alcohol abuse    CAD- LAD DES in setting of NSTEMI Sept 2014 04/06/2013   Chest pain    Coronary artery disease    s/P PCI of the LAD with DES.  Nuclear stress test 04/2014 with no ischemia   Displaced fracture of proximal phalanx of left little finger with routine healing 08/01/2018   Drug use    Encounter for long-term (current) use of other medications 04/06/2013   History of  left hip replacement    Hypercholesteremia    Hyperlipidemia    Joint pain    Mixed hyperlipidemia 04/06/2013   Morbid obesity due to excess calories (HCC) 05/26/2015   Nondisplaced fracture of proximal phalanx of left great toe, initial encounter for closed fracture 08/01/2018   Obesity, unspecified 10/08/2012   Old MI (myocardial infarction) 04/06/2013   OSA on CPAP    followed by Dr. Jessie Foot   Personal history of urinary calculi    Pre-diabetes    Primary osteoarthritis of left hip 09/20/2018   Primary osteoarthritis of right hip 11/21/2018   Shortness of breath    Sleep apnea    Status post total hip replacement, left 04/10/2019   Status post total replacement of left hip 01/15/2019   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    Umbilical hernia without obstruction and without gangrene 08/25/2020    Current Outpatient Medications on File Prior to Visit  Medication Sig Dispense Refill   amLODipine (NORVASC) 5 MG tablet Take 1 tablet (5 mg total) by mouth daily. 90 tablet 3   aspirin EC 81 MG tablet Take  1 tablet (81 mg total) by mouth daily. Swallow whole. 90 tablet 3   atorvastatin (LIPITOR) 80 MG tablet Take 1 tablet (80 mg total) by mouth daily. 90 tablet 3   Cholecalciferol (VITAMIN D3) 25 MCG (1000 UT) CAPS 5,000 international unit otc qd     clopidogrel (PLAVIX) 75 MG tablet Take 1 tablet (75 mg total) by mouth daily. 90 tablet 3   cyanocobalamin (VITAMIN B12) 500 MCG tablet Take 1 tablet (500 mcg total) by mouth daily.     nitroGLYCERIN (NITROSTAT) 0.4 MG SL tablet Place 1 tablet (0.4 mg total) under the tongue every 5 (five) minutes x 3 doses as needed for chest pain. 25 tablet 9   ondansetron (ZOFRAN-ODT) 4 MG disintegrating tablet Take 1 tablet (4 mg total) by mouth every 8 (eight) hours as needed for nausea or vomiting. 90 tablet 0   Semaglutide-Weight Management (WEGOVY) 0.25 MG/0.5ML SOAJ Inject 0.25 mg into the skin once a week.     Semaglutide-Weight Management 1 MG/0.5ML SOAJ  Inject 1 mg into the skin once a week for 28 days. 2 mL 0   [START ON 05/06/2023] Semaglutide-Weight Management 1.7 MG/0.75ML SOAJ Inject 1.7 mg into the skin once a week for 28 days. 3 mL 0   [START ON 06/04/2023] Semaglutide-Weight Management 2.4 MG/0.75ML SOAJ Inject 2.4 mg into the skin once a week for 28 days. 3 mL 0   Vitamin D, Ergocalciferol, (DRISDOL) 1.25 MG (50000 UNIT) CAPS capsule Take 1 capsule (50,000 Units total) by mouth every 7 (seven) days. 4 capsule 0   No current facility-administered medications on file prior to visit.    Allergies  Allergen Reactions   Coconut Fatty Acid Shortness Of Breath and Swelling   Penicillins Hives and Rash    Did it involve swelling of the face/tongue/throat, SOB, or low BP? Yes Did it involve sudden or severe rash/hives, skin peeling, or any reaction on the inside of your mouth or nose? No Did you need to seek medical attention at a hospital or doctor's office? No When did it last happen?      unknown  If all above answers are "NO", may proceed with cephalosporin use.      Assessment/Plan:  1. Weight loss - Patient would benefit from GLP1 therapy but lacks coverage. Gave another sample pen today and will run PA under CAD diagnosis.  Advised patient on common side effects including nausea, diarrhea, dyspepsia, decreased appetite, and fatigue. Counseled patient on reducing meal size and how to titrate medication to minimize side effects. Counseled patient to call if intolerable side effects or if experiencing dehydration, abdominal pain, or dizziness. Patient will adhere to dietary modifications and will target at least 150 minutes of moderate intensity exercise weekly.   Laural Golden, PharmD, BCACP, CDCES, CPP [address] [place], [place], [address] Phone: [phone], Fax: [phone]

## 2023-05-10 ENCOUNTER — Other Ambulatory Visit: Payer: Self-pay

## 2023-05-10 ENCOUNTER — Other Ambulatory Visit (HOSPITAL_COMMUNITY): Payer: Self-pay

## 2023-05-17 NOTE — Progress Notes (Unsigned)
 Wayne Jordan

## 2023-05-18 ENCOUNTER — Encounter: Payer: Self-pay | Admitting: Adult Health

## 2023-05-18 ENCOUNTER — Ambulatory Visit: Payer: Commercial Managed Care - PPO | Admitting: Adult Health

## 2023-05-18 VITALS — BP 124/75 | HR 89 | Ht 71.0 in | Wt 276.2 lb

## 2023-05-18 DIAGNOSIS — G4733 Obstructive sleep apnea (adult) (pediatric): Secondary | ICD-10-CM

## 2023-05-18 NOTE — Patient Instructions (Signed)
 Continue using CPAP nightly and greater than 4 hours each night If your symptoms worsen or you develop new symptoms please let us know.

## 2023-05-18 NOTE — Progress Notes (Signed)
 PATIENT: Wayne Jordan DOB: [birth year]  REASON FOR VISIT: follow up HISTORY FROM: patient Primary neurologist: Dr. Albertina Hugger  Chief Complaint  Patient presents with   Room 5    Pt is here Alone. Pt states that he is doing well with his CPAP Machine. Pt states that he doesn't have any new questions or concerns today.        HISTORY OF PRESENT ILLNESS: Today 05/18/23:  Wayne Jordan is a 63 y.o. male with a history of OSA on CPAP. Returns today for follow-up.  Reports that CPAP is working well for him.  He denies any new issues.  He states that he did get supplies from his DME company 1 time but they charged him $680.  He has not gotten any additional supplies.  However he does state that he had enough supplies leftover from previous shipments that will last him until he switches to Medicare in 5 months       08/20/21: Wayne Jordan is a 63 year old male with a history of obstructive sleep apnea on CPAP.  He returns today for follow-up.  His download is below. Reports that he never got a new machine after the recall. Straps are too lose he had to have his daughter sew the straps.     08/20/20: Wayne Jordan is a 63 year old male with a history of obstructive sleep apnea on CPAP.  His download indicates that he uses machine 29 out of 30 days for compliance of 96.7%.  Every night that he uses machine he used it greater than 4 hours.  On average he uses it 7 hours and 14 minutes.  His residual AHI is 1.3 on 13 cm of water.  Patient does not have a significant leak with his mask.  Overall he is doing well.  08/20/19: Wayne Jordan is a 63 year old male with a history of obstructive sleep apnea on CPAP.  His download indicates that he uses machine nightly for compliance of 100%.  He uses machine greater than 4 hours each night.  His residual AHI is 1.9 on 13 cm of water.  Reports that the CPAP is working well for him.  Reports that he has not changed out his supplies since last year.  HISTORY The  patient states that download indicates that he used his machine 27 out of 30 days for compliance of 90%.  He uses machine greater than 4 hours each night.  On average he uses his machine 7 hours and 37 minutes.  His residual AHI is 2.2 on 13 cm of water.  He states occasionally he will feel the mask leaking however he had a mask refitting and does like his new mask.  He denies any new issues.  REVIEW OF SYSTEMS: Out of a complete 14 system review of symptoms, the patient complains only of the following symptoms, and all other reviewed systems are negative.   ESS 4  ALLERGIES: Allergies  Allergen Reactions   Coconut Fatty Acid Shortness Of Breath and Swelling   Penicillins Hives and Rash    Did it involve swelling of the face/tongue/throat, SOB, or low BP? Yes Did it involve sudden or severe rash/hives, skin peeling, or any reaction on the inside of your mouth or nose? No Did you need to seek medical attention at a hospital or doctor's office? No When did it last happen?      unknown  If all above answers are "NO", may proceed with cephalosporin use.     HOME  MEDICATIONS: Outpatient Medications Prior to Visit  Medication Sig Dispense Refill   amLODipine  (NORVASC ) 5 MG tablet Take 1 tablet (5 mg total) by mouth daily. 90 tablet 3   aspirin  EC 81 MG tablet Take 1 tablet (81 mg total) by mouth daily. Swallow whole. 90 tablet 3   atorvastatin  (LIPITOR) 80 MG tablet Take 1 tablet (80 mg total) by mouth daily. 90 tablet 3   Cholecalciferol (VITAMIN D3) 25 MCG (1000 UT) CAPS 5,000 international unit otc qd     clopidogrel  (PLAVIX ) 75 MG tablet Take 1 tablet (75 mg total) by mouth daily. 90 tablet 3   cyanocobalamin  (VITAMIN B12) 500 MCG tablet Take 1 tablet (500 mcg total) by mouth daily.     Semaglutide -Weight Management (WEGOVY ) 0.25 MG/0.5ML SOAJ Inject 0.25 mg into the skin once a week.     nitroGLYCERIN  (NITROSTAT ) 0.4 MG SL tablet Place 1 tablet (0.4 mg total) under the tongue every 5  (five) minutes x 3 doses as needed for chest pain. (Patient not taking: Reported on 05/18/2023) 25 tablet 9   ondansetron  (ZOFRAN -ODT) 4 MG disintegrating tablet Take 1 tablet (4 mg total) by mouth every 8 (eight) hours as needed for nausea or vomiting. (Patient not taking: Reported on 05/18/2023) 90 tablet 0   No facility-administered medications prior to visit.    PAST MEDICAL HISTORY: Past Medical History:  Diagnosis Date   Acute myocardial infarction of other anterior wall, initial episode of care    Alcohol abuse    CAD- LAD DES in setting of NSTEMI Sept 2014 04/06/2013   Chest pain    Coronary artery disease    s/P PCI of the LAD with DES.  Nuclear stress test 04/2014 with no ischemia   Displaced fracture of proximal phalanx of left little finger with routine healing 08/01/2018   Drug use    Encounter for long-term (current) use of other medications 04/06/2013   History of left hip replacement    Hypercholesteremia    Hyperlipidemia    Joint pain    Mixed hyperlipidemia 04/06/2013   Morbid obesity due to excess calories (HCC) 05/26/2015   Nondisplaced fracture of proximal phalanx of left great toe, initial encounter for closed fracture 08/01/2018   Obesity, unspecified 10/08/2012   Old MI (myocardial infarction) 04/06/2013   OSA on CPAP    followed by Dr. Lanis Pitcher   Personal history of urinary calculi    Pre-diabetes    Primary osteoarthritis of left hip 09/20/2018   Primary osteoarthritis of right hip 11/21/2018   Shortness of breath    Sleep apnea    Status post total hip replacement, left 04/10/2019   Status post total replacement of left hip 01/15/2019   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    Umbilical hernia without obstruction and without gangrene 08/25/2020    PAST SURGICAL HISTORY: Past Surgical History:  Procedure Laterality Date   INGUINAL HERNIA REPAIR Right 03/08/2012   Procedure: HERNIA REPAIR INGUINAL ADULT;  Surgeon: Shela Derby, MD;  Location: [facility] OR;   Service: General;  Laterality: Right;   INSERTION OF MESH N/A 03/08/2012   Procedure: INSERTION OF MESH;  Surgeon: Shela Derby, MD;  Location: [facility] OR;  Service: General;  Laterality: N/A;   INSERTION OF MESH N/A 10/22/2020   Procedure: INSERTION OF MESH;  Surgeon: Lujean Sake, MD;  Location: [facility] OR;  Service: General;  Laterality: N/A;   LEFT HEART CATH AND CORONARY ANGIOGRAPHY N/A 06/28/2022   Procedure: LEFT HEART CATH AND CORONARY ANGIOGRAPHY;  Surgeon: Millicent Ally, MD;  Location: [facility] INVASIVE CV LAB;  Service: Cardiovascular;  Laterality: N/A;   LEFT HEART CATHETERIZATION WITH CORONARY ANGIOGRAM Right 10/07/2012   Procedure: LEFT HEART CATHETERIZATION WITH CORONARY ANGIOGRAM;  Surgeon: Lucendia Rusk, MD;  Location: [facility] CATH LAB;  Service: Cardiovascular;  Laterality: Right;   METACARPAL OSTEOTOMY Left 11/01/2018   Procedure: Left small finger osteophyte removal;  Surgeon: Wes Hamman, MD;  Location: [facility];  Service: Orthopedics;  Laterality: Left;   PERCUTANEOUS STENT INTERVENTION  10/07/2012   Procedure: PERCUTANEOUS STENT INTERVENTION;  Surgeon: Lucendia Rusk, MD;  Location: [facility] CATH LAB;  Service: Cardiovascular;;   TOTAL HIP ARTHROPLASTY Left 01/15/2019   Procedure: LEFT TOTAL HIP ARTHROPLASTY ANTERIOR APPROACH;  Surgeon: Wes Hamman, MD;  Location: [facility] OR;  Service: Orthopedics;  Laterality: Left;   UMBILICAL HERNIA REPAIR N/A 10/22/2020   Procedure: OPEN UMBILICAL HERNIA REPAIR WITH MESH;  Surgeon: Lujean Sake, MD;  Location: [facility] OR;  Service: General;  Laterality: N/A;    FAMILY HISTORY: Family History  Problem Relation Age of Onset   Heart disease Father    Heart attack Father    CAD Father     SOCIAL HISTORY: Social History   Socioeconomic History   Marital status: Married    Spouse name: Abbie   Number of children: Not on file   Years of education: Not on file   Highest education level: Not on file  Occupational History    Occupation: Retired  Tobacco Use   Smoking status: Former    Current packs/day: 0.00    Average packs/day: 1.5 packs/day for 7.0 years (10.5 ttl pk-yrs)    Types: Cigarettes    Start date: 10/01/2005    Quit date: 10/01/2012    Years since quitting: 10.6    Passive exposure: Past   Smokeless tobacco: Never  Vaping Use   Vaping status: Never Used  Substance and Sexual Activity   Alcohol use: Not Currently    Comment: occassionally   Drug use: No   Sexual activity: Yes  Other Topics Concern   Not on file  Social History Narrative   Drinks 2 cheerwines daily.   Social Drivers of Corporate investment banker Strain: Not on file  Food Insecurity: Not on file  Transportation Needs: Not on file  Physical Activity: Not on file  Stress: Not on file  Social Connections: Not on file  Intimate Partner Violence: Not on file      PHYSICAL EXAM  Vitals:   05/18/23 1026  BP: 124/75  Pulse: 89  Weight: 276 lb 3.2 oz (125.3 kg)  Height: 5\' 11"  (1.803 m)    Body mass index is 38.52 kg/m.  Generalized: Well developed, in no acute distress  Chest: Lungs clear to auscultation bilaterally  Neurological examination  Mentation: Alert oriented to time, place, history taking. Follows all commands speech and language fluent Cranial nerve II-XII: Facial symmetry noted  DIAGNOSTIC DATA (LABS, IMAGING, TESTING) - I reviewed patient records, labs, notes, testing and imaging myself where available.  Lab Results  Component Value Date   WBC 7.9 06/25/2022   HGB 15.5 06/25/2022   HCT 47.7 06/25/2022   MCV 91 06/25/2022   PLT 186 06/25/2022      Component Value Date/Time   NA 143 09/13/2022 0959   K 4.2 09/13/2022 0959   CL 105 09/13/2022 0959   CO2 21 09/13/2022 0959   GLUCOSE 99 09/13/2022 0959   GLUCOSE  107 (H) 10/17/2020 1200   BUN 12 09/13/2022 0959   CREATININE 1.03 09/13/2022 0959   CALCIUM  9.8 09/13/2022 0959   PROT 7.0 09/13/2022 0959   ALBUMIN 4.5 09/13/2022 0959    AST 26 09/13/2022 0959   ALT 35 09/13/2022 0959   ALKPHOS 106 09/13/2022 0959   BILITOT 0.8 09/13/2022 0959   GFRNONAA >60 10/17/2020 1200   GFRAA >60 01/08/2019 1145   Lab Results  Component Value Date   CHOL 112 01/31/2023   HDL 40 01/31/2023   LDLCALC 36 01/31/2023   TRIG 229 (H) 01/31/2023   CHOLHDL 2.8 01/31/2023   Lab Results  Component Value Date   HGBA1C 5.7 (H) 01/31/2023   Lab Results  Component Value Date   TSH 2.600 07/13/2021      ASSESSMENT AND PLAN 63 y.o. year old male  has a past medical history of Acute myocardial infarction of other anterior wall, initial episode of care, Alcohol abuse, CAD- LAD DES in setting of NSTEMI Sept 2014 (04/06/2013), Chest pain, Coronary artery disease, Displaced fracture of proximal phalanx of left little finger with routine healing (08/01/2018), Drug use, Encounter for long-term (current) use of other medications (04/06/2013), History of left hip replacement, Hypercholesteremia, Hyperlipidemia, Joint pain, Mixed hyperlipidemia (04/06/2013), Morbid obesity due to excess calories (HCC) (05/26/2015), Nondisplaced fracture of proximal phalanx of left great toe, initial encounter for closed fracture (08/01/2018), Obesity, unspecified (10/08/2012), Old MI (myocardial infarction) (04/06/2013), OSA on CPAP, Personal history of urinary calculi, Pre-diabetes, Primary osteoarthritis of left hip (09/20/2018), Primary osteoarthritis of right hip (11/21/2018), Shortness of breath, Sleep apnea, Status post total hip replacement, left (04/10/2019), Status post total replacement of left hip (01/15/2019), 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, and Umbilical hernia without obstruction and without gangrene (08/25/2020). here with:  OSA on CPAP  - CPAP compliance excellent - Good treatment of AHI  - Encourage patient to use CPAP nightly and > 4 hours each night - F/U in 1 year or sooner if needed   Clem Currier, MSN, NP-C 05/18/2023, 10:32 AM [facility] [address] [place], [place] [address] [phone]

## 2023-06-01 ENCOUNTER — Other Ambulatory Visit (HOSPITAL_COMMUNITY): Payer: Self-pay

## 2023-06-01 ENCOUNTER — Other Ambulatory Visit (INDEPENDENT_AMBULATORY_CARE_PROVIDER_SITE_OTHER): Payer: Self-pay | Admitting: Family Medicine

## 2023-06-01 ENCOUNTER — Telehealth (INDEPENDENT_AMBULATORY_CARE_PROVIDER_SITE_OTHER): Payer: Self-pay

## 2023-06-01 ENCOUNTER — Encounter (INDEPENDENT_AMBULATORY_CARE_PROVIDER_SITE_OTHER): Payer: Self-pay | Admitting: Family Medicine

## 2023-06-01 ENCOUNTER — Ambulatory Visit (INDEPENDENT_AMBULATORY_CARE_PROVIDER_SITE_OTHER): Admitting: Family Medicine

## 2023-06-01 VITALS — BP 125/84 | HR 84 | Temp 97.6°F | Ht 71.0 in | Wt 271.0 lb

## 2023-06-01 DIAGNOSIS — Z6837 Body mass index (BMI) 37.0-37.9, adult: Secondary | ICD-10-CM | POA: Diagnosis not present

## 2023-06-01 DIAGNOSIS — E538 Deficiency of other specified B group vitamins: Secondary | ICD-10-CM | POA: Diagnosis not present

## 2023-06-01 DIAGNOSIS — E559 Vitamin D deficiency, unspecified: Secondary | ICD-10-CM

## 2023-06-01 DIAGNOSIS — R7303 Prediabetes: Secondary | ICD-10-CM

## 2023-06-01 DIAGNOSIS — I10 Essential (primary) hypertension: Secondary | ICD-10-CM | POA: Diagnosis not present

## 2023-06-01 DIAGNOSIS — E65 Localized adiposity: Secondary | ICD-10-CM

## 2023-06-01 NOTE — Telephone Encounter (Signed)
 Called patient to give futher instructions about his Vitamin D  prescription, left him a message. Asked him to return my call.

## 2023-06-01 NOTE — Progress Notes (Signed)
 Wayne Jordan, D.O.  ABFM, ABOM Specializing in Clinical Bariatric Medicine  Office located at: [address] [place], [place]  [address]   Assessment and Plan:   Orders Placed This Encounter  Procedures   VITAMIN D  25 Hydroxy (Vit-D Deficiency, Fractures)   Vitamin B12   Hemoglobin A1c   Lipid panel   Comprehensive metabolic panel with GFR   Insulin , random    There are no discontinued medications.   No orders of the defined types were placed in this encounter.   Labs obtained today (A1c, insulin , lipid panel, CMP w GFR, vit D, and vit B12) will be reviewed at next OV.    FOR THE DISEASE OF OBESITY: BMI 37.0-37.9, adult -- Current BMI 37.81 Morbid obesity (HCC)-start bmi 39.33 Assessment & Plan: Since last office visit on 04/20/23 patient's muscle mass has decreased by 0.8 lb. Fat mass has decreased by 1 lb. Total body water has increased by 0.4 lb.  Counseling done on how various foods will affect these numbers and how to maximize success  Total lbs lost to date: 11 lbs Total weight loss percentage to date: -3.90%   Recommended Dietary Goals Wayne Jordan is currently in the action stage of change. As such, his goal is to continue weight management plan.  He has agreed to: continue current plan   Behavioral Intervention We discussed the following today: decreasing simple carbohydrates , increasing vegetables, increasing lower glycemic fruits, increasing water intake , reading food labels , identifying sources and decreasing liquid calories, and decreasing eating out or consumption of processed foods, and making healthy choices when eating convenient foods  Additional resources provided today: Handout on Examples of Low Glycemic Index and Low Calorie Fruits & Vegetables  Evidence-based interventions for health behavior change were utilized today including the discussion of self monitoring techniques, problem-solving barriers and SMART goal setting techniques.    Regarding patient's less desirable eating habits and patterns, we employed the technique of small changes.   Pt will specifically work on: n/a   Recommended Physical Activity Goals [name] has been advised to work up to 300-450 minutes of moderate intensity aerobic activity a week and strengthening exercises 2-3 times per week for cardiovascular health, weight loss maintenance and preservation of muscle mass.   He has agreed to :  Continue current level of physical activity , Think about enjoyable ways to increase daily physical activity and overcoming barriers to exercise, and Increase physical activity in their day and reduce sedentary time (increase NEAT).   Pharmacotherapy We both agreed to: continue with nutritional and behavioral strategies   ASSOCIATED CONDITIONS ADDRESSED TODAY: Visceral obesity Assessment & Plan: Pt's visceral fat rating today is 22. His fat mass was 102.6 lbs on 05/21/21 to 94.4 lbs today. Ideal visceral fat rating for men of less than 10 reviewed with pt. Education provided on visceral adiposity in a physiological context, including high visceral adiposity leading to higher risk of cardiovascular disease.  Decrease simple carb/sugar intake --> follow meal plan.    Prediabetes Assessment & Plan: Lab Results  Component Value Date   HGBA1C 5.7 (H) 01/31/2023   HGBA1C 5.5 09/13/2022   HGBA1C 5.6 02/09/2022   INSULIN  21.6 01/31/2023   INSULIN  23.2 09/13/2022   INSULIN  20.5 02/09/2022   Pt has been taking Wegovy  on Thursdays; last dose was last week. He was previously given Wegovy  dispensed by NP at his cardiology office on 4/8. He states the samples were available at the clinic. Pt was taking  for 1 month but will not able to continue medication due to high self-pay costs. Reviewed ideal A1c levels. Decrease simple carbs/ sugars; increase fiber and proteins. Continue to work on weight loss, exercise, via their meal plan. Will recheck A1c and insulin  levels today;  results to be reviewed at his next OV.   Orders: - Recheck A1c and insulin    Vitamin D  deficiency Assessment & Plan: Lab Results  Component Value Date   VD25OH 54.0 01/31/2023   VD25OH 40.3 09/13/2022   VD25OH 76.4 02/09/2022   Pt reports he did not receive his Ergocalciferol , which is typically mailed to his home. Per EHR, the refill order was placed on 4/7 and the pharmacy discontinued the medication due to it not being covered by insurance. Pt denies receiving any calls to be informed of this change. Since then, pt has been taking OTC Vit D3 5,000 units once daily for 1 month. Will contact pharmacy about this issue. Continue taking OTC supplementation until ERGO can be obtained and resumed. Will recheck vit D today and review results at his next OV.   Orders: - Recheck vit D  Essential hypertension Assessment & Plan: BP Readings from Last 3 Encounters:  06/01/23 125/84  05/18/23 124/75  04/20/23 118/76   Pt is on Amlodipine  5 mg once daily. Good compliance and tolerance; no SE reported. No acute concerns. Continue with antihypertensive treatment. Will recheck lipid panel and CMP with GFR today; results to be reviewed at next OV.   Orders: - Recheck lipid panel and CMP w GFR.    B12 deficiency due to diet Assessment & Plan: Pt compliant with vit B12 500 mcg once daily. No acute concerns. Continue current supplementation regimen. Will recheck vit B12 levels; results to be reviewed at next OV.   Orders: - Recheck Vit B12   Follow up:   Return in about 4 weeks (around 06/29/2023). He was informed of the importance of frequent follow up visits to maximize his success with intensive lifestyle modifications for his multiple health conditions.  Subjective:   Chief complaint: Obesity Deckard is here to discuss his progress with his obesity treatment plan. He is on the the Category 4 Plan and keeping a food journal and adhering to recommended goals of 1700-1800 calories and 130+  g of protein and states he is following his eating plan approximately 50% of the time. He states he is doing cardio or weights 30-45 minutes 3 days per week.  Interval History:  Wayne Jordan is here for a follow up office visit. Since last OV on 04/20/23, he has lost 2 lbs.  For breakfast, he tends to drink a fruit smoothie. He has been measuring his protein intake, eating at least 6-8 ounces per meal and overall eating 100-110 grams of protein per day   Pharmacotherapy for weight loss: He is currently taking Wegovy  with adequate clinical response  and without side effects..   Review of Systems:  Pertinent positives were addressed with patient today.  Reviewed by clinician on day of visit: allergies, medications, problem list, medical history, surgical history, family history, social history, and previous encounter notes.  Weight Summary and Biometrics   Weight Lost Since Last Visit: 2lb  Weight Gained Since Last Visit: 0   Vitals Temp: 97.6 F (36.4 C) BP: 125/84 Pulse Rate: 84 SpO2: 93 %   Anthropometric Measurements Height: 5\' 11"  (1.803 m) Weight: 271 lb (122.9 kg) BMI (Calculated): 37.81 Weight at Last Visit: 273lb Weight Lost Since Last Visit:  2lb Weight Gained Since Last Visit: 0 Starting Weight: 282lb Total Weight Loss (lbs): 11 lb (4.99 kg) Peak Weight: 290lb   Body Composition  Body Fat %: 34.8 % Fat Mass (lbs): 94.4 lbs Muscle Mass (lbs): 168.2 lbs Total Body Water (lbs): 119.6 lbs Visceral Fat Rating : 22   Other Clinical Data Fasting: yes Labs: no Today's Visit #: 25 Starting Date: 05/21/21    Objective:   PHYSICAL EXAM: Blood pressure 125/84, pulse 84, temperature 97.6 F (36.4 C), height 5\' 11"  (1.803 m), weight 271 lb (122.9 kg), SpO2 93%. Body mass index is 37.8 kg/m.  General: he is overweight, cooperative and in no acute distress. PSYCH: Has normal mood, affect and thought process.   HEENT: EOMI, sclerae are anicteric. Lungs:  Normal breathing effort, no conversational dyspnea. Extremities: Moves * 4 Neurologic: A and O * 3, good insight  DIAGNOSTIC DATA REVIEWED: BMET    Component Value Date/Time   NA 143 09/13/2022 0959   K 4.2 09/13/2022 0959   CL 105 09/13/2022 0959   CO2 21 09/13/2022 0959   GLUCOSE 99 09/13/2022 0959   GLUCOSE 107 (H) 10/17/2020 1200   BUN 12 09/13/2022 0959   CREATININE 1.03 09/13/2022 0959   CALCIUM  9.8 09/13/2022 0959   GFRNONAA >60 10/17/2020 1200   GFRAA >60 01/08/2019 1145   Lab Results  Component Value Date   HGBA1C 5.7 (H) 01/31/2023   HGBA1C 5.6 10/07/2012   Lab Results  Component Value Date   INSULIN  21.6 01/31/2023   INSULIN  22.2 05/21/2021   Lab Results  Component Value Date   TSH 2.600 07/13/2021   CBC    Component Value Date/Time   WBC 7.9 06/25/2022 1006   WBC 6.5 10/17/2020 1200   RBC 5.27 06/25/2022 1006   RBC 5.06 10/17/2020 1200   HGB 15.5 06/25/2022 1006   HCT 47.7 06/25/2022 1006   PLT 186 06/25/2022 1006   MCV 91 06/25/2022 1006   MCH 29.4 06/25/2022 1006   MCH 30.2 10/17/2020 1200   MCHC 32.5 06/25/2022 1006   MCHC 32.7 10/17/2020 1200   RDW 12.8 06/25/2022 1006   Iron Studies No results found for: "IRON", "TIBC", "FERRITIN", "IRONPCTSAT" Lipid Panel     Component Value Date/Time   CHOL 112 01/31/2023 1012   CHOL CANCELED 11/07/2014 0921   CHOL SEE BELOW 11/07/2014 0921   CHOL 128 04/26/2014 0757   TRIG 229 (H) 01/31/2023 1012   TRIG CANCELED 11/07/2014 0921   TRIG SEE BELOW 11/07/2014 0921   TRIG 140 04/26/2014 0757   HDL 40 01/31/2023 1012   HDL CANCELED 11/07/2014 0921   HDL SEE BELOW 11/07/2014 0921   HDL 39 (L) 04/26/2014 0757   CHOLHDL 2.8 01/31/2023 1012   CHOLHDL 2.6 04/18/2015 0930   VLDL 22 04/18/2015 0930   LDLCALC 36 01/31/2023 1012   LDLCALC CANCELED 11/07/2014 0921   LDLCALC SEE BELOW 11/07/2014 0921   LDLCALC 61 04/26/2014 0757   Hepatic Function Panel     Component Value Date/Time   PROT 7.0  09/13/2022 0959   ALBUMIN 4.5 09/13/2022 0959   AST 26 09/13/2022 0959   ALT 35 09/13/2022 0959   ALKPHOS 106 09/13/2022 0959   BILITOT 0.8 09/13/2022 0959   BILIDIR 0.19 02/21/2017 0934   IBILI 0.5 11/07/2014 0921      Component Value Date/Time   TSH 2.600 07/13/2021 0938   Nutritional Lab Results  Component Value Date   VD25OH 54.0 01/31/2023   VD25OH 40.3 09/13/2022  VD25OH 76.4 02/09/2022    Attestations:   Cherryl Corona, acting as a medical scribe for Marceil Sensor, DO., have compiled all relevant documentation for today's office visit on behalf of Marceil Sensor, DO, while in the presence of [employer], DO.  Reviewed by clinician on day of visit: allergies, medications, problem list, medical history, surgical history, family history, social history, and previous encounter notes pertinent to patient's obesity diagnosis.  I have reviewed the above documentation for accuracy and completeness, and I agree with the above. Wayne Jordan, D.O.  The 21st Century Cures Act was signed into law in 2016 which includes the topic of electronic health records.  This provides immediate access to information in MyChart.  This includes consultation notes, operative notes, office notes, lab results and pathology reports.  If you have any questions about what you read please let us  know at your next visit so we can discuss your concerns and take corrective action if need be.  We are right here with you.

## 2023-06-02 LAB — INSULIN, RANDOM: INSULIN: 20.7 u[IU]/mL (ref 2.6–24.9)

## 2023-06-02 LAB — LIPID PANEL
Chol/HDL Ratio: 2.5 ratio (ref 0.0–5.0)
Cholesterol, Total: 98 mg/dL — ABNORMAL LOW (ref 100–199)
HDL: 39 mg/dL — ABNORMAL LOW (ref >39)
LDL Chol Calc (NIH): 38 mg/dL (ref 0–99)
Triglycerides: 116 mg/dL (ref 0–149)
VLDL Cholesterol Cal: 21 mg/dL (ref 5–40)

## 2023-06-02 LAB — COMPREHENSIVE METABOLIC PANEL WITH GFR
ALT: 26 IU/L (ref 0–44)
AST: 19 IU/L (ref 0–40)
Albumin: 4.5 g/dL (ref 3.9–4.9)
Alkaline Phosphatase: 111 IU/L (ref 44–121)
BUN/Creatinine Ratio: 14 (ref 10–24)
BUN: 15 mg/dL (ref 8–27)
Bilirubin Total: 0.7 mg/dL (ref 0.0–1.2)
CO2: 22 mmol/L (ref 20–29)
Calcium: 9.9 mg/dL (ref 8.6–10.2)
Chloride: 103 mmol/L (ref 96–106)
Creatinine, Ser: 1.09 mg/dL (ref 0.76–1.27)
Globulin, Total: 2.6 g/dL (ref 1.5–4.5)
Glucose: 94 mg/dL (ref 70–99)
Potassium: 4.5 mmol/L (ref 3.5–5.2)
Sodium: 140 mmol/L (ref 134–144)
Total Protein: 7.1 g/dL (ref 6.0–8.5)
eGFR: 76 mL/min/{1.73_m2} (ref >59)

## 2023-06-02 LAB — VITAMIN D 25 HYDROXY (VIT D DEFICIENCY, FRACTURES): Vit D, 25-Hydroxy: 60.2 ng/mL (ref 30.0–100.0)

## 2023-06-02 LAB — VITAMIN B12: Vitamin B-12: >2000 pg/mL — ABNORMAL HIGH (ref 232–1245)

## 2023-06-02 LAB — HEMOGLOBIN A1C
Est. average glucose Bld gHb Est-mCnc: 114 mg/dL
Hgb A1c MFr Bld: 5.6 % (ref 4.8–5.6)

## 2023-06-06 ENCOUNTER — Other Ambulatory Visit (HOSPITAL_COMMUNITY): Payer: Self-pay

## 2023-06-07 ENCOUNTER — Telehealth: Payer: Self-pay

## 2023-06-07 MED ORDER — WEGOVY 0.25 MG/0.5ML ~~LOC~~ SOAJ: 0.2500 mg | SUBCUTANEOUS | 0 refills | Status: DC

## 2023-06-07 NOTE — Telephone Encounter (Signed)
 Wegovy  sample provided per Pattricia Bores, NP

## 2023-06-20 ENCOUNTER — Other Ambulatory Visit (HOSPITAL_COMMUNITY): Payer: Self-pay

## 2023-06-29 ENCOUNTER — Ambulatory Visit (INDEPENDENT_AMBULATORY_CARE_PROVIDER_SITE_OTHER): Admitting: Family Medicine

## 2023-06-29 ENCOUNTER — Encounter (INDEPENDENT_AMBULATORY_CARE_PROVIDER_SITE_OTHER): Payer: Self-pay | Admitting: Family Medicine

## 2023-06-29 VITALS — BP 138/81 | HR 75 | Temp 97.9°F | Ht 71.0 in | Wt 274.0 lb

## 2023-06-29 DIAGNOSIS — E7849 Other hyperlipidemia: Secondary | ICD-10-CM | POA: Diagnosis not present

## 2023-06-29 DIAGNOSIS — E538 Deficiency of other specified B group vitamins: Secondary | ICD-10-CM

## 2023-06-29 DIAGNOSIS — Z6839 Body mass index (BMI) 39.0-39.9, adult: Secondary | ICD-10-CM

## 2023-06-29 DIAGNOSIS — Z6838 Body mass index (BMI) 38.0-38.9, adult: Secondary | ICD-10-CM

## 2023-06-29 DIAGNOSIS — E559 Vitamin D deficiency, unspecified: Secondary | ICD-10-CM | POA: Diagnosis not present

## 2023-06-29 DIAGNOSIS — I10 Essential (primary) hypertension: Secondary | ICD-10-CM

## 2023-06-29 MED ORDER — CYANOCOBALAMIN 500 MCG PO TABS: 500.0000 ug | ORAL_TABLET | Freq: Every day | ORAL | Status: DC

## 2023-06-29 MED ORDER — VITAMIN D3 25 MCG (1000 UT) PO CAPS: ORAL_CAPSULE | ORAL | Status: AC

## 2023-06-29 NOTE — Progress Notes (Signed)
 Wayne Jordan, D.O.  ABFM, ABOM Specializing in Clinical Bariatric Medicine  Office located at: [address] [place], [place]  [address]   Assessment and Plan:  No orders of the defined types were placed in this encounter.  Medications Discontinued During This Encounter  Medication Reason   ondansetron  (ZOFRAN -ODT) 4 MG disintegrating tablet    Cholecalciferol (VITAMIN D3) 25 MCG (1000 UT) CAPS Reorder   cyanocobalamin  (VITAMIN B12) 500 MCG tablet Reorder    Meds ordered this encounter  Medications   Cholecalciferol (VITAMIN D3) 25 MCG (1000 UT) CAPS    Sig: 5,000 international unit otc qd   cyanocobalamin  (VITAMIN B12) 500 MCG tablet    Sig: Take 1 tablet (500 mcg total) by mouth daily. Cut dose into a 1/3      FOR THE DISEASE OF OBESITY: BMI 38.0-38.9,adult - Current BMI 38.23 Morbid obesity (HCC)-start bmi 39.33 Assessment & Plan: Since last office visit on 06/01/23 patient's muscle mass has increased by 1 lb. Fat mass has increased by 1.8 lbs. Total body water has increased by 2 lbs. Counseling done on how various foods will affect these numbers and how to maximize success  Total lbs lost to date: 8 lbs Total weight loss percentage to date: -2.84%    Recommended Dietary Goals Wayne Jordan is currently in the action stage of change. As such, his goal is to continue weight management plan.  He has agreed to: continue current plan   Behavioral Intervention We discussed the following today: increasing lean protein intake to established goals, decreasing simple carbohydrates , increasing water intake , work on meal planning and preparation, decreasing eating out or consumption of processed foods, and making healthy choices when eating convenient foods, work on managing stress, creating time for self-care and relaxation, and continue to practice mindfulness when eating  Additional resources provided today: None  Evidence-based interventions for health behavior change  were utilized today including the discussion of self monitoring techniques, problem-solving barriers and SMART goal setting techniques.   Regarding patient's less desirable eating habits and patterns, we employed the technique of small changes.   Pt will specifically work on: Aim to exercise 5 days/week for next visit.    Recommended Physical Activity Goals Wayne Jordan has been advised to work up to 300-450 minutes of moderate intensity aerobic activity a week and strengthening exercises 2-3 times per week for cardiovascular health, weight loss maintenance and preservation of muscle mass.   He has agreed to :  continue to gradually increase the amount and intensity of exercise routine   Pharmacotherapy We both agreed to: Continue with current nutritional and behavioral strategies and Continue Wegovy  until he runs out of samples.   Only has 1 dose left of Wegovy  samples left. He is no longer getting samples and does not have more refills. We discussed options of getting Wegovy  via [employer], but pt states it will be cost prohibitive.    ASSOCIATED CONDITIONS ADDRESSED TODAY: Other hyperlipidemia with high TG Assessment & Plan: Lab Results  Component Value Date   CHOL 98 (L) 06/01/2023   HDL 39 (L) 06/01/2023   LDLCALC 38 06/01/2023   TRIG 116 06/01/2023   CHOLHDL 2.5 06/01/2023   Compliant with Lipitor 80 mg daily and Plavix  75 mg daily. Tolerating well with no SE. Most recent HDL was below goal at 39 as of 06/01/23. Decrease high saturated and trans fats intake. Decrease carb/sugar intake. Increase exercise to 5 days a week. Increase water intake to at  least half his body weight in ounces of water per day. Continue current medication regimen as prescribed.    Vitamin D  deficiency Assessment & Plan: Lab Results  Component Value Date   VD25OH 60.2 06/01/2023   VD25OH 54.0 01/31/2023   VD25OH 40.3 09/13/2022   Compliant with OTC vit D supplementation 5,000 once daily. Tolerating  well with no SE. No acute concerns today. Reviewed ideal vit D levels of 50-70, pt verbalized understanding. Will refill vit D today, no dose changes.   Orders: - Refill OTC vitamin D , no changes.    B12 deficiency due to diet Assessment & Plan: Lab Results  Component Value Date   VITAMINB12 >2000 (H) 06/01/2023   VITAMINB12 406 01/31/2023   Most recent B12 was above goal at >2000 as of 06/01/23. Pt is uncertain of exact dosing of B12 supplementation; either 500 mcg MWF or 5,000 mcg MWF. Reviewed ideal B12 levels. Pt will confirm exact B12 dose at home and will decrease dose by 1/3. Will refill B12 today. If pt taking 500 mcg 3x/week, he will decrease dose to 500 mcg once weekly. If taking 5,000 3x/week, he will decrease dose to 5,000 mcg once weekly. Will continue to monitor condition alongside PCP as it relates to his weight loss journey.   Orders: - Refill B12 today, see dose details above.    Essential hypertension Assessment & Plan: BP Readings from Last 3 Encounters:  06/29/23 138/81  06/01/23 125/84  05/18/23 124/75   BP is at high normal range today, which he attributes to increased stress. He notes both his father and daughter are currently hospitalized and he has gone between visiting them both and caring for his grandchildren. He is compliant with Amlodipine  5 mg once daily with good tolerance. No SE reported. Kidney function stable. Reviewed goal BP of less than 140/90. Encouraged pt to continue following heart-healthy meal plan. Stay well hydrated, drinking at least half his body weight in ounces of water per day. Will continue monitoring.    Follow up:   Return in about 6 weeks (around 08/10/2023). He was informed of the importance of frequent follow up visits to maximize his success with intensive lifestyle modifications for his multiple health conditions.  Subjective:   Chief complaint: Obesity Wayne Jordan is here to discuss his progress with his obesity treatment plan. He  is on the Category 4 Plan and keeping a food journal and adhering to recommended goals of 1700-1800 calories and 130+ g of protein and states he is following his eating plan approximately 50 % of the time. He states he is exercising at the gym for 45 minutes 3 days per week.   Interval History:  Wayne Jordan is here for a follow up office visit. Since last OV on 06/01/23, he is up 3 lbs. He has not been eating on plan - eating out of vending machines. His daughter has been in the hospital and he has been baby sitting his grandchildren. He was also previously staying in the hospital visiting his father.  Has not been able to use his CPAP machine in 2 nights.   Pharmacotherapy for weight loss: He is currently taking Wegovy , but only has 1 dose left. He is no longer receiving samples.  .   Review of Systems:  Pertinent positives were addressed with patient today.  Reviewed by clinician on day of visit: allergies, medications, problem list, medical history, surgical history, family history, social history, and previous encounter notes.  Weight Summary and Biometrics  Weight Lost Since Last Visit: 0lb  Weight Gained Since Last Visit: 3lb    Vitals Temp: 97.9 F (36.6 C) BP: 138/81 Pulse Rate: 75 SpO2: 99 %   Anthropometric Measurements Height: 5' 11 (1.803 m) Weight: 274 lb (124.3 kg) BMI (Calculated): 38.23 Weight at Last Visit: 271lb Weight Lost Since Last Visit: 0lb Weight Gained Since Last Visit: 3lb Starting Weight: 282lb Total Weight Loss (lbs): 8 lb (3.629 kg) Peak Weight: 290lb   Body Composition  Body Fat %: 35.1 % Fat Mass (lbs): 96.2 lbs Muscle Mass (lbs): 169.2 lbs Total Body Water (lbs): 121.6 lbs Visceral Fat Rating : 22   Other Clinical Data Fasting: Yes Labs: no Today's Visit #: 26 Starting Date: 05/21/21    Objective:   PHYSICAL EXAM: Blood pressure 138/81, pulse 75, temperature 97.9 F (36.6 C), height 5' 11 (1.803 m), weight 274 lb (124.3  kg), SpO2 99%. Body mass index is 38.22 kg/m.  General: he is overweight, cooperative and in no acute distress. PSYCH: Has normal mood, affect and thought process.   HEENT: EOMI, sclerae are anicteric. Lungs: Normal breathing effort, no conversational dyspnea. Extremities: Moves * 4 Neurologic: A and O * 3, good insight  DIAGNOSTIC DATA REVIEWED: BMET    Component Value Date/Time   NA 140 06/01/2023 1247   K 4.5 06/01/2023 1247   CL 103 06/01/2023 1247   CO2 22 06/01/2023 1247   GLUCOSE 94 06/01/2023 1247   GLUCOSE 107 (H) 10/17/2020 1200   BUN 15 06/01/2023 1247   CREATININE 1.09 06/01/2023 1247   CALCIUM  9.9 06/01/2023 1247   GFRNONAA >60 10/17/2020 1200   GFRAA >60 01/08/2019 1145   Lab Results  Component Value Date   HGBA1C 5.6 06/01/2023   HGBA1C 5.6 10/07/2012   Lab Results  Component Value Date   INSULIN  20.7 06/01/2023   INSULIN  22.2 05/21/2021   Lab Results  Component Value Date   TSH 2.600 07/13/2021   CBC    Component Value Date/Time   WBC 7.9 06/25/2022 1006   WBC 6.5 10/17/2020 1200   RBC 5.27 06/25/2022 1006   RBC 5.06 10/17/2020 1200   HGB 15.5 06/25/2022 1006   HCT 47.7 06/25/2022 1006   PLT 186 06/25/2022 1006   MCV 91 06/25/2022 1006   MCH 29.4 06/25/2022 1006   MCH 30.2 10/17/2020 1200   MCHC 32.5 06/25/2022 1006   MCHC 32.7 10/17/2020 1200   RDW 12.8 06/25/2022 1006   Iron Studies No results found for: IRON, TIBC, FERRITIN, IRONPCTSAT Lipid Panel     Component Value Date/Time   CHOL 98 (L) 06/01/2023 1247   CHOL CANCELED 11/07/2014 0921   CHOL SEE BELOW 11/07/2014 0921   CHOL 128 04/26/2014 0757   TRIG 116 06/01/2023 1247   TRIG CANCELED 11/07/2014 0921   TRIG SEE BELOW 11/07/2014 0921   TRIG 140 04/26/2014 0757   HDL 39 (L) 06/01/2023 1247   HDL CANCELED 11/07/2014 0921   HDL SEE BELOW 11/07/2014 0921   HDL 39 (L) 04/26/2014 0757   CHOLHDL 2.5 06/01/2023 1247   CHOLHDL 2.6 04/18/2015 0930   VLDL 22 04/18/2015  0930   LDLCALC 38 06/01/2023 1247   LDLCALC CANCELED 11/07/2014 0921   LDLCALC SEE BELOW 11/07/2014 0921   LDLCALC 61 04/26/2014 0757   Hepatic Function Panel     Component Value Date/Time   PROT 7.1 06/01/2023 1247   ALBUMIN 4.5 06/01/2023 1247   AST 19 06/01/2023 1247   ALT 26 06/01/2023 1247  ALKPHOS 111 06/01/2023 1247   BILITOT 0.7 06/01/2023 1247   BILIDIR 0.19 02/21/2017 0934   IBILI 0.5 11/07/2014 0921      Component Value Date/Time   TSH 2.600 07/13/2021 0938   Nutritional Lab Results  Component Value Date   VD25OH 60.2 06/01/2023   VD25OH 54.0 01/31/2023   VD25OH 40.3 09/13/2022    Attestations:   I, Bernita Bristle, acting as a medical scribe for Marceil Sensor, DO., have compiled all relevant documentation for today's office visit on behalf of Marceil Sensor, DO, while in the presence of [employer], DO.  Reviewed by clinician on day of visit: allergies, medications, problem list, medical history, surgical history, family history, social history, and previous encounter notes pertinent to patient's obesity diagnosis.  I have reviewed the above documentation for accuracy and completeness, and I agree with the above. Wayne Jordan, D.O.  The 21st Century Cures Act was signed into law in 2016 which includes the topic of electronic health records.  This provides immediate access to information in MyChart.  This includes consultation notes, operative notes, office notes, lab results and pathology reports.  If you have any questions about what you read please let us  know at your next visit so we can discuss your concerns and take corrective action if need be.  We are right here with you.

## 2023-07-18 ENCOUNTER — Other Ambulatory Visit: Payer: Self-pay

## 2023-07-18 MED ORDER — WEGOVY 0.25 MG/0.5ML ~~LOC~~ SOAJ: 0.2500 mg | SUBCUTANEOUS | Status: DC

## 2023-08-10 ENCOUNTER — Ambulatory Visit (INDEPENDENT_AMBULATORY_CARE_PROVIDER_SITE_OTHER): Admitting: Family Medicine

## 2023-08-12 ENCOUNTER — Other Ambulatory Visit (HOSPITAL_COMMUNITY): Payer: Self-pay

## 2023-09-21 ENCOUNTER — Ambulatory Visit (INDEPENDENT_AMBULATORY_CARE_PROVIDER_SITE_OTHER): Admitting: Family Medicine

## 2023-10-03 ENCOUNTER — Ambulatory Visit (INDEPENDENT_AMBULATORY_CARE_PROVIDER_SITE_OTHER): Admitting: Family Medicine

## 2023-11-02 ENCOUNTER — Encounter (INDEPENDENT_AMBULATORY_CARE_PROVIDER_SITE_OTHER): Payer: Self-pay

## 2023-11-02 ENCOUNTER — Other Ambulatory Visit (HOSPITAL_COMMUNITY): Payer: Self-pay

## 2024-02-04 ENCOUNTER — Other Ambulatory Visit: Payer: Self-pay | Admitting: Cardiology

## 2024-02-05 ENCOUNTER — Other Ambulatory Visit (HOSPITAL_COMMUNITY): Payer: Self-pay

## 2024-02-05 ENCOUNTER — Encounter (HOSPITAL_COMMUNITY): Payer: Self-pay | Admitting: Pharmacist

## 2024-02-06 ENCOUNTER — Other Ambulatory Visit: Payer: Self-pay

## 2024-02-06 ENCOUNTER — Other Ambulatory Visit (HOSPITAL_COMMUNITY): Payer: Self-pay

## 2024-02-06 MED ORDER — NITROGLYCERIN 0.4 MG SL SUBL
0.4000 mg | SUBLINGUAL_TABLET | SUBLINGUAL | 9 refills | Status: AC | PRN
  Filled 2024-02-06: qty 25, 8d supply, fill #0

## 2024-02-15 ENCOUNTER — Other Ambulatory Visit: Payer: Self-pay

## 2024-02-15 MED ORDER — CLOPIDOGREL BISULFATE 75 MG PO TABS: 75.0000 mg | ORAL_TABLET | Freq: Every day | ORAL | 3 refills | Status: DC

## 2024-02-15 MED ORDER — AMLODIPINE BESYLATE 5 MG PO TABS: 5.0000 mg | ORAL_TABLET | Freq: Every day | ORAL | 3 refills | Status: DC

## 2024-02-15 MED ORDER — ATORVASTATIN CALCIUM 80 MG PO TABS: 80.0000 mg | ORAL_TABLET | Freq: Every day | ORAL | 3 refills | Status: DC

## 2024-02-16 ENCOUNTER — Encounter: Payer: Self-pay | Admitting: Cardiology

## 2024-02-16 ENCOUNTER — Ambulatory Visit: Payer: Self-pay | Admitting: Cardiology

## 2024-02-16 VITALS — BP 134/78 | HR 81 | Ht 70.0 in | Wt 294.0 lb

## 2024-02-16 DIAGNOSIS — Z6839 Body mass index (BMI) 39.0-39.9, adult: Secondary | ICD-10-CM

## 2024-02-16 DIAGNOSIS — E782 Mixed hyperlipidemia: Secondary | ICD-10-CM | POA: Diagnosis not present

## 2024-02-16 DIAGNOSIS — I10 Essential (primary) hypertension: Secondary | ICD-10-CM | POA: Diagnosis not present

## 2024-02-16 DIAGNOSIS — I25110 Atherosclerotic heart disease of native coronary artery with unstable angina pectoris: Secondary | ICD-10-CM | POA: Diagnosis not present

## 2024-02-16 DIAGNOSIS — E66812 Obesity, class 2: Secondary | ICD-10-CM | POA: Diagnosis not present

## 2024-02-16 NOTE — Patient Instructions (Signed)
 Medication Instructions:  Your physician recommends that you continue on your current medications as directed. Please refer to the Current Medication list given to you today.  *If you need a refill on your cardiac medications before your next appointment, please call your pharmacy*  Lab Work: None If you have labs (blood work) drawn today and your tests are completely normal, you will receive your results only by: MyChart Message (if you have MyChart) OR A paper copy in the mail If you have any lab test that is abnormal or we need to change your treatment, we will call you to review the results.  Testing/Procedures: None  Follow-Up: At [facility], you and your health needs are our priority.  As part of our continuing mission to provide you with exceptional heart care, our providers are all part of one team.  This team includes your primary Cardiologist (physician) and Advanced Practice Providers or APPs (Physician Assistants and Nurse Practitioners) who all work together to provide you with the care you need, when you need it.  Your next appointment:   1 year(s)  Provider:   Ralene Burger, MD    We recommend signing up for the patient portal called "MyChart".  Sign up information is provided on this After Visit Summary.  MyChart is used to connect with patients for Virtual Visits (Telemedicine).  Patients are able to view lab/test results, encounter notes, upcoming appointments, etc.  Non-urgent messages can be sent to your provider as well.   To learn more about what you can do with MyChart, go to [email or web address].   Other Instructions None

## 2024-02-16 NOTE — Progress Notes (Signed)
 " Cardiology Office Note:    Date:  02/16/2024   ID:  Wayne Jordan, DOB [birth year], MRN [id number]  PCP:  Wayne Greig PARAS, NP  Cardiologist:  Wayne Fitch, MD    Referring MD: Wayne Greig PARAS, NP   No chief complaint on file. Doing fine  History of Present Illness:     Wayne Jordan is a 64 y.o. male past medical history significant for coronary artery disease, in September 2022 he did have LAD and drug-eluting stent secondary to acute myocardial infarction, additional problem include essential hypertension, dyslipidemia, obstructive sleep apnea.  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  Comes today to my office, doing great asymptomatic no chest pain tightness squeezing pressure mid chest, goes to gym 3 times a week and exercise doing so weightlifting and also aerobic exercise.  We did try Wegovy  to help him lose weight but they did not work out  Past Medical History:  Diagnosis Date   Acute myocardial infarction of other anterior wall, initial episode of care    Alcohol abuse    CAD- LAD DES in setting of NSTEMI Sept 2014 04/06/2013   Chest pain    Coronary artery disease    s/P PCI of the LAD with DES.  Nuclear stress test 04/2014 with no ischemia   Displaced fracture of proximal phalanx of left little finger with routine healing 08/01/2018   Drug use    Encounter for long-term (current) use of other medications 04/06/2013   History of left hip replacement    Hypercholesteremia    Hyperlipidemia    Joint pain    Mixed hyperlipidemia 04/06/2013   Morbid obesity due to excess calories (HCC) 05/26/2015   Nondisplaced fracture of proximal phalanx of left great toe, initial encounter for closed fracture 08/01/2018   Obesity, unspecified 10/08/2012   Old MI (myocardial  infarction) 04/06/2013   OSA on CPAP    followed by Dr. Lott   Personal history of urinary calculi    Pre-diabetes    Primary osteoarthritis of left hip 09/20/2018   Primary osteoarthritis of right hip 11/21/2018   Shortness of breath    Sleep apnea    Status post total hip replacement, left 04/10/2019   Status post total replacement of left hip 01/15/2019   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    Umbilical hernia without obstruction and without gangrene 08/25/2020    Past Surgical History:  Procedure Laterality Date   INGUINAL HERNIA REPAIR Right 03/08/2012   Procedure: HERNIA REPAIR INGUINAL ADULT;  Surgeon: Lynda Leos, MD;  Location: [facility] OR;  Service: General;  Laterality: Right;   INSERTION OF MESH N/A 03/08/2012   Procedure: INSERTION OF MESH;  Surgeon: Lynda Leos, MD;  Location: [facility] OR;  Service: General;  Laterality: N/A;   INSERTION OF MESH N/A 10/22/2020   Procedure: INSERTION OF MESH;  Surgeon: Dasie Leonor CROME, MD;  Location: [facility] OR;  Service: General;  Laterality: N/A;   LEFT HEART CATH AND CORONARY ANGIOGRAPHY N/A 06/28/2022   Procedure: LEFT HEART CATH AND CORONARY ANGIOGRAPHY;  Surgeon: Burnard Debby LABOR, MD;  Location: [facility] INVASIVE CV LAB;  Service: Cardiovascular;  Laterality: N/A;   LEFT HEART CATHETERIZATION WITH CORONARY ANGIOGRAM Right 10/07/2012   Procedure: LEFT HEART CATHETERIZATION WITH CORONARY ANGIOGRAM;  Surgeon: Candyce GORMAN Reek,  MD;  Location: [facility] CATH LAB;  Service: Cardiovascular;  Laterality: Right;   METACARPAL OSTEOTOMY Left 11/01/2018   Procedure: Left small finger osteophyte removal;  Surgeon: Jerri Kay HERO, MD;  Location: [facility];  Service: Orthopedics;  Laterality: Left;   PERCUTANEOUS STENT INTERVENTION  10/07/2012   Procedure: PERCUTANEOUS STENT INTERVENTION;  Surgeon: Candyce GORMAN Reek, MD;  Location: [facility] CATH LAB;  Service: Cardiovascular;;   TOTAL HIP ARTHROPLASTY Left 01/15/2019   Procedure: LEFT TOTAL HIP ARTHROPLASTY ANTERIOR  APPROACH;  Surgeon: Jerri Kay HERO, MD;  Location: [facility] OR;  Service: Orthopedics;  Laterality: Left;   UMBILICAL HERNIA REPAIR N/A 10/22/2020   Procedure: OPEN UMBILICAL HERNIA REPAIR WITH MESH;  Surgeon: Dasie Leonor CROME, MD;  Location: [facility] OR;  Service: General;  Laterality: N/A;    Current Medications: Active Medications[1]   Allergies:   Coconut fatty acid and Penicillins   Social History   Socioeconomic History   Marital status: Married    Spouse name: Abbie   Number of children: Not on file   Years of education: Not on file   Highest education level: Not on file  Occupational History   Occupation: Retired  Tobacco Use   Smoking status: Former    Current packs/day: 0.00    Average packs/day: 1.5 packs/day for 7.0 years (10.5 ttl pk-yrs)    Types: Cigarettes    Start date: 10/01/2005    Quit date: 10/01/2012    Years since quitting: 11.3    Passive exposure: Past   Smokeless tobacco: Never  Vaping Use   Vaping status: Never Used  Substance and Sexual Activity   Alcohol use: Not Currently    Comment: occassionally   Drug use: No   Sexual activity: Yes  Other Topics Concern   Not on file  Social History Narrative   Drinks 2 cheerwines daily.   Social Drivers of Health   Tobacco Use: Medium Risk (02/16/2024)   Patient History    Smoking Tobacco Use: Former    Smokeless Tobacco Use: Never    Passive Exposure: Past  Programmer, Applications: Not on file  Food Insecurity: Not on file  Transportation Needs: Not on file  Physical Activity: Not on file  Stress: Not on file  Social Connections: Not on file  Depression (PHQ2-9): Low Risk (07/13/2021)   Depression (PHQ2-9)    PHQ-2 Score: 0  Recent Concern: Depression (PHQ2-9) - Medium Risk (05/21/2021)   Depression (PHQ2-9)    PHQ-2 Score: 10  Alcohol Screen: Not on file  Housing: Not on file  Utilities: Not on file  Health Literacy: Not on file     Family History: The patient's family history includes CAD in his  father; Heart attack in his father; Heart disease in his father. ROS:   Please see the history of present illness.    All 14 point review of systems negative except as described per history of present illness  EKGs/Labs/Other Studies Reviewed:    EKG Interpretation Date/Time:  Thursday February 16 2024 09:04:12 EST Ventricular Rate:  81 PR Interval:  178 QRS Duration:  98 QT Interval:  376 QTC Calculation: 436 R Axis:   -14  Text Interpretation: Normal sinus rhythm Normal ECG When compared with ECG of 08-Jan-2019 11:46, No significant change was found Confirmed by Bernie Charleston [phone]) on 02/16/2024 9:20:36 AM    Recent Labs: 06/01/2023: ALT 26; BUN 15; Creatinine, Ser 1.09; Potassium 4.5; Sodium 140  Recent Lipid Panel    Component Value Date/Time  CHOL 98 (L) 06/01/2023 1247   CHOL CANCELED 11/07/2014 0921   CHOL SEE BELOW 11/07/2014 0921   CHOL 128 04/26/2014 0757   TRIG 116 06/01/2023 1247   TRIG CANCELED 11/07/2014 0921   TRIG SEE BELOW 11/07/2014 0921   TRIG 140 04/26/2014 0757   HDL 39 (L) 06/01/2023 1247   HDL CANCELED 11/07/2014 0921   HDL SEE BELOW 11/07/2014 0921   HDL 39 (L) 04/26/2014 0757   CHOLHDL 2.5 06/01/2023 1247   CHOLHDL 2.6 04/18/2015 0930   VLDL 22 04/18/2015 0930   LDLCALC 38 06/01/2023 1247   LDLCALC CANCELED 11/07/2014 0921   LDLCALC SEE BELOW 11/07/2014 0921   LDLCALC 61 04/26/2014 0757    Physical Exam:    VS:  BP 134/78   Pulse 81   Ht 5' 10 (1.778 m)   Wt 294 lb (133.4 kg)   SpO2 97%   BMI 42.18 kg/m     Wt Readings from Last 3 Encounters:  02/16/24 294 lb (133.4 kg)  06/29/23 274 lb (124.3 kg)  06/01/23 271 lb (122.9 kg)     GEN:  Well nourished, well developed in no acute distress HEENT: Normal NECK: No JVD; No carotid bruits LYMPHATICS: No lymphadenopathy CARDIAC: RRR, no murmurs, no rubs, no gallops RESPIRATORY:  Clear to auscultation without rales, wheezing or rhonchi  ABDOMEN: Soft, non-tender,  non-distended MUSCULOSKELETAL:  No edema; No deformity  SKIN: Warm and dry LOWER EXTREMITIES: no swelling NEUROLOGIC:  Alert and oriented x 3 PSYCHIATRIC:  Normal affect   ASSESSMENT:    1. Mixed hyperlipidemia   2. Coronary artery disease involving native coronary artery of native heart with unstable angina pectoris (HCC)   3. Essential hypertension   4. Class 2 severe obesity with serious comorbidity and body mass index (BMI) of 39.0 to 39.9 in adult, unspecified obesity type    PLAN:    In order of problems listed above:  Coronary disease stable from that continue asymptomatic we will continue monitoring. Essential hypertension blood pressure well-controlled continue present management. Dyslipidemia he is on Lipitor 80 which I will continue LDL 38 HDL 39 good control continue present management. Obesity still a problem he understands he trying to be [place] more careful with diet and exercise   Medication Adjustments/Labs and Tests Ordered: Current medicines are reviewed at length with the patient today.  Concerns regarding medicines are outlined above.  Orders Placed This Encounter  Procedures   EKG 12-Lead   Medication changes: No orders of the defined types were placed in this encounter.   Signed, Wayne DOROTHA Fitch, MD, [facility] 02/16/2024 9:32 AM    [facility]    [1]  Current Meds  Medication Sig   amLODipine  (NORVASC ) 5 MG tablet Take 1 tablet (5 mg total) by mouth daily.   aspirin  EC 81 MG tablet Take 1 tablet (81 mg total) by mouth daily. Swallow whole.   atorvastatin  (LIPITOR) 80 MG tablet Take 1 tablet (80 mg total) by mouth daily.   Cholecalciferol (VITAMIN D3) 25 MCG (1000 UT) CAPS 5,000 international unit otc qd   clopidogrel  (PLAVIX ) 75 MG tablet Take 1 tablet (75 mg total) by mouth daily.   Niacin (VITAMIN B-3 PO) Take 250 Units by mouth daily at 6 (six) AM.   nitroGLYCERIN  (NITROSTAT ) 0.4 MG SL tablet Place 1 tablet (0.4 mg  total) under the tongue every 5 (five) minutes x 3 doses as needed for chest pain.   "

## 2024-02-17 ENCOUNTER — Ambulatory Visit: Payer: Self-pay | Admitting: Cardiology

## 2024-02-24 ENCOUNTER — Other Ambulatory Visit: Payer: Self-pay

## 2024-02-24 MED ORDER — CLOPIDOGREL BISULFATE 75 MG PO TABS: 75.0000 mg | ORAL_TABLET | Freq: Every day | ORAL | 3 refills | Status: DC

## 2024-02-24 MED ORDER — ATORVASTATIN CALCIUM 80 MG PO TABS: 80.0000 mg | ORAL_TABLET | Freq: Every day | ORAL | 3 refills | Status: AC

## 2024-02-24 MED ORDER — ATORVASTATIN CALCIUM 80 MG PO TABS: 80.0000 mg | ORAL_TABLET | Freq: Every day | ORAL | 3 refills | Status: DC

## 2024-02-24 MED ORDER — CLOPIDOGREL BISULFATE 75 MG PO TABS: 75.0000 mg | ORAL_TABLET | Freq: Every day | ORAL | 3 refills | Status: AC

## 2024-02-24 MED ORDER — AMLODIPINE BESYLATE 5 MG PO TABS: 5.0000 mg | ORAL_TABLET | Freq: Every day | ORAL | 3 refills | Status: DC

## 2024-02-24 MED ORDER — AMLODIPINE BESYLATE 5 MG PO TABS: 5.0000 mg | ORAL_TABLET | Freq: Every day | ORAL | 3 refills | Status: AC

## 2024-05-15 ENCOUNTER — Telehealth: Admitting: Adult Health
